# Patient Record
Sex: Female | Born: 1965 | Race: Black or African American | Hispanic: No | Smoking: Current every day smoker
Health system: Southern US, Community
[De-identification: ages and names within clinical notes are randomized; demographics above are authoritative.]

## PROBLEM LIST (undated history)

## (undated) DIAGNOSIS — F319 Bipolar disorder, unspecified: Secondary | ICD-10-CM

---

## 2015-12-26 ENCOUNTER — Ambulatory Visit: Payer: Self-pay | Admitting: Family Medicine

## 2016-08-25 ENCOUNTER — Emergency Department: Payer: Medicaid Other

## 2016-08-25 ENCOUNTER — Inpatient Hospital Stay
Admission: EM | Admit: 2016-08-25 | Discharge: 2016-09-11 | DRG: 617 | Disposition: A | Discharge status: Home / Self Care | Payer: Medicaid Other | Attending: Internal Medicine | Admitting: Internal Medicine

## 2016-08-25 ENCOUNTER — Encounter: Payer: Self-pay | Admitting: Emergency Medicine

## 2016-08-25 DIAGNOSIS — E1165 Type 2 diabetes mellitus with hyperglycemia: Secondary | ICD-10-CM | POA: Diagnosis present

## 2016-08-25 DIAGNOSIS — E11621 Type 2 diabetes mellitus with foot ulcer: Secondary | ICD-10-CM | POA: Diagnosis present

## 2016-08-25 DIAGNOSIS — F17200 Nicotine dependence, unspecified, uncomplicated: Secondary | ICD-10-CM | POA: Diagnosis present

## 2016-08-25 DIAGNOSIS — L03116 Cellulitis of left lower limb: Secondary | ICD-10-CM | POA: Diagnosis present

## 2016-08-25 DIAGNOSIS — E876 Hypokalemia: Secondary | ICD-10-CM | POA: Diagnosis present

## 2016-08-25 DIAGNOSIS — M869 Osteomyelitis, unspecified: Secondary | ICD-10-CM | POA: Diagnosis present

## 2016-08-25 DIAGNOSIS — E1152 Type 2 diabetes mellitus with diabetic peripheral angiopathy with gangrene: Secondary | ICD-10-CM | POA: Diagnosis present

## 2016-08-25 DIAGNOSIS — B9562 Methicillin resistant Staphylococcus aureus infection as the cause of diseases classified elsewhere: Secondary | ICD-10-CM | POA: Diagnosis present

## 2016-08-25 DIAGNOSIS — E1169 Type 2 diabetes mellitus with other specified complication: Secondary | ICD-10-CM | POA: Diagnosis present

## 2016-08-25 DIAGNOSIS — E039 Hypothyroidism, unspecified: Secondary | ICD-10-CM | POA: Diagnosis present

## 2016-08-25 DIAGNOSIS — R609 Edema, unspecified: Secondary | ICD-10-CM

## 2016-08-25 DIAGNOSIS — S92412A Displaced fracture of proximal phalanx of left great toe, initial encounter for closed fracture: Secondary | ICD-10-CM | POA: Diagnosis present

## 2016-08-25 DIAGNOSIS — F3110 Bipolar disorder, current episode manic without psychotic features, unspecified: Secondary | ICD-10-CM

## 2016-08-25 DIAGNOSIS — Z888 Allergy status to other drugs, medicaments and biological substances status: Secondary | ICD-10-CM

## 2016-08-25 DIAGNOSIS — Z7984 Long term (current) use of oral hypoglycemic drugs: Secondary | ICD-10-CM

## 2016-08-25 DIAGNOSIS — L0390 Cellulitis, unspecified: Secondary | ICD-10-CM | POA: Diagnosis present

## 2016-08-25 DIAGNOSIS — F312 Bipolar disorder, current episode manic severe with psychotic features: Secondary | ICD-10-CM | POA: Diagnosis present

## 2016-08-25 DIAGNOSIS — Z89421 Acquired absence of other right toe(s): Secondary | ICD-10-CM

## 2016-08-25 DIAGNOSIS — F419 Anxiety disorder, unspecified: Secondary | ICD-10-CM | POA: Diagnosis present

## 2016-08-25 DIAGNOSIS — I10 Essential (primary) hypertension: Secondary | ICD-10-CM | POA: Diagnosis present

## 2016-08-25 DIAGNOSIS — F29 Unspecified psychosis not due to a substance or known physiological condition: Secondary | ICD-10-CM

## 2016-08-25 DIAGNOSIS — L97529 Non-pressure chronic ulcer of other part of left foot with unspecified severity: Secondary | ICD-10-CM | POA: Diagnosis present

## 2016-08-25 DIAGNOSIS — L03119 Cellulitis of unspecified part of limb: Secondary | ICD-10-CM

## 2016-08-25 HISTORY — DX: Bipolar disorder, unspecified: F31.9

## 2016-08-25 LAB — COMPREHENSIVE METABOLIC PANEL
ALBUMIN: 3.8 g/dL (ref 3.5–5.0)
ALT: 33 U/L (ref 14–54)
AST: 26 U/L (ref 15–41)
Alkaline Phosphatase: 91 U/L (ref 38–126)
Anion gap: 7 (ref 5–15)
BILIRUBIN TOTAL: 0.8 mg/dL (ref 0.3–1.2)
BUN: 14 mg/dL (ref 6–20)
CO2: 27 mmol/L (ref 22–32)
Calcium: 9.3 mg/dL (ref 8.9–10.3)
Chloride: 106 mmol/L (ref 101–111)
Creatinine, Ser: 0.52 mg/dL (ref 0.44–1.00)
GFR calc Af Amer: >60 mL/min (ref >60)
GFR calc non Af Amer: >60 mL/min (ref >60)
GLUCOSE: 155 mg/dL — AB (ref 65–99)
POTASSIUM: 3.3 mmol/L — AB (ref 3.5–5.1)
SODIUM: 140 mmol/L (ref 135–145)
TOTAL PROTEIN: 7.4 g/dL (ref 6.5–8.1)

## 2016-08-25 LAB — CBC
HEMATOCRIT: 35.4 % (ref 35.0–47.0)
HEMOGLOBIN: 11.4 g/dL — AB (ref 12.0–16.0)
MCH: 26.5 pg (ref 26.0–34.0)
MCHC: 32.2 g/dL (ref 32.0–36.0)
MCV: 82.2 fL (ref 80.0–100.0)
Platelets: 371 10*3/uL (ref 150–440)
RBC: 4.31 MIL/uL (ref 3.80–5.20)
RDW: 13.8 % (ref 11.5–14.5)
WBC: 11.4 10*3/uL — ABNORMAL HIGH (ref 3.6–11.0)

## 2016-08-25 LAB — LACTIC ACID, PLASMA
Lactic Acid, Venous: 0.9 mmol/L (ref 0.5–1.9)
Lactic Acid, Venous: 1 mmol/L (ref 0.5–1.9)

## 2016-08-25 LAB — ACETAMINOPHEN LEVEL

## 2016-08-25 LAB — ETHANOL: Alcohol, Ethyl (B): <5 mg/dL (ref <5)

## 2016-08-25 LAB — SALICYLATE LEVEL: Salicylate Lvl: <7 mg/dL (ref 2.8–30.0)

## 2016-08-25 MED ORDER — PIPERACILLIN-TAZOBACTAM 3.375 G IVPB 30 MIN
3.3750 g | Freq: Once | INTRAVENOUS | Status: AC
  Administered 2016-08-25: 3.375 g via INTRAVENOUS
  Filled 2016-08-25: qty 50

## 2016-08-25 MED ORDER — HALOPERIDOL LACTATE 5 MG/ML IJ SOLN
5.0000 mg | Freq: Once | INTRAMUSCULAR | Status: AC
  Administered 2016-08-25: 5 mg via INTRAMUSCULAR

## 2016-08-25 MED ORDER — VANCOMYCIN HCL IN DEXTROSE 1-5 GM/200ML-% IV SOLN
1000.0000 mg | Freq: Three times a day (TID) | INTRAVENOUS | Status: DC
  Administered 2016-08-26 (×4): 1000 mg via INTRAVENOUS
  Filled 2016-08-25 (×5): qty 200

## 2016-08-25 MED ORDER — HALOPERIDOL LACTATE 5 MG/ML IJ SOLN
INTRAMUSCULAR | Status: AC
  Administered 2016-08-25: 5 mg via INTRAMUSCULAR
  Filled 2016-08-25: qty 1

## 2016-08-25 MED ORDER — VANCOMYCIN HCL IN DEXTROSE 1-5 GM/200ML-% IV SOLN
1000.0000 mg | Freq: Once | INTRAVENOUS | Status: AC
  Administered 2016-08-25: 1000 mg via INTRAVENOUS
  Filled 2016-08-25: qty 200

## 2016-08-25 MED ORDER — LORAZEPAM 2 MG/ML IJ SOLN: 2.0000 mg | Freq: Four times a day (QID) | INTRAMUSCULAR | Status: DC | PRN

## 2016-08-25 MED ORDER — LORAZEPAM 2 MG/ML IJ SOLN
INTRAMUSCULAR | Status: AC
  Administered 2016-08-25: 2 mg via INTRAMUSCULAR
  Filled 2016-08-25: qty 1

## 2016-08-25 MED ORDER — LORAZEPAM 2 MG/ML IJ SOLN
2.0000 mg | Freq: Once | INTRAMUSCULAR | Status: AC
  Administered 2016-08-25: 2 mg via INTRAMUSCULAR

## 2016-08-25 MED ORDER — DIPHENHYDRAMINE HCL 50 MG/ML IJ SOLN
INTRAMUSCULAR | Status: AC
  Administered 2016-08-25: 50 mg via INTRAMUSCULAR
  Filled 2016-08-25: qty 1

## 2016-08-25 MED ORDER — VANCOMYCIN HCL IN DEXTROSE 1-5 GM/200ML-% IV SOLN: 1000.0000 mg | Freq: Two times a day (BID) | INTRAVENOUS | Status: DC

## 2016-08-25 MED ORDER — DIPHENHYDRAMINE HCL 50 MG/ML IJ SOLN
50.0000 mg | Freq: Once | INTRAMUSCULAR | Status: AC
  Administered 2016-08-25: 50 mg via INTRAMUSCULAR

## 2016-08-25 MED ORDER — ENOXAPARIN SODIUM 40 MG/0.4ML ~~LOC~~ SOLN
40.0000 mg | SUBCUTANEOUS | Status: DC
  Administered 2016-08-26 – 2016-09-10 (×15): 40 mg via SUBCUTANEOUS
  Filled 2016-08-25 (×16): qty 0.4

## 2016-08-25 MED ORDER — PIPERACILLIN-TAZOBACTAM 3.375 G IVPB
3.3750 g | Freq: Three times a day (TID) | INTRAVENOUS | Status: DC
  Administered 2016-08-26 – 2016-09-09 (×42): 3.375 g via INTRAVENOUS
  Filled 2016-08-25 (×46): qty 50

## 2016-08-25 NOTE — ED Provider Notes (Signed)
[facility] Emergency Department Provider Note  ____________________________________________   First MD Initiated Contact with Patient 08/25/16 1512     (approximate)  I have reviewed the triage vital signs and the nursing notes.   HISTORY  Chief Complaint Psychiatric Evaluation   HPI Mary Mcdonald is a 51 y.o. female with a history of bipolar disorder who is presenting to the emergency department with police escorts afterdestructive behavior home. There is a suspicion that she has not been taking her medications. When I asked her questions about what brings her to the emergency department she rambles incoherently and is very tangential. I'm unable to get a meaningful answer from her about her medications or what brought her into the hospital.   Past Medical History:  Diagnosis Date  . Bipolar 1 disorder (HCC)     There are no active problems to display for this patient.   History reviewed. No pertinent surgical history.  Prior to Admission medications   Not on File    Allergies Lithium  No family history on file.  Social History Social History  Substance Use Topics  . Smoking status: Current Every Day Smoker  . Smokeless tobacco: Never Used  . Alcohol use Not on file    Review of Systems Level V caveat secondary to altered mental status/psychosis. ____________________________________________   PHYSICAL EXAM:  VITAL SIGNS: ED Triage Vitals  Enc Vitals Group     BP 08/25/16 1416 138/86     Pulse Rate 08/25/16 1416 (!) 105     Resp 08/25/16 1416 16     Temp 08/25/16 1416 97.8 F (36.6 C)     Temp Source 08/25/16 1416 Oral     SpO2 08/25/16 1416 100 %     Weight 08/25/16 1417 160 lb (72.6 kg)     Height --      Head Circumference --      Peak Flow --      Pain Score --      Pain Loc --      Pain Edu? --      Excl. in GC? --     Constitutional: Alert and in no acute distress. Eyes: Conjunctivae are normal. PERRL.  EOMI. Head: Atraumatic. Nose: No congestion/rhinnorhea. Mouth/Throat: Mucous membranes are moist.   Neck: No stridor.   Cardiovascular: tachycardic, regular rhythm. Grossly normal heart sounds.  Good peripheral circulation with equal and bilateral posterior tibial pulses. Respiratory: Normal respiratory effort.  No retractions. Lungs CTAB. Gastrointestinal: Soft and nontender. No distention.  Musculoskeletal:   Left foot is swollen and warm to touch. There are patches of black skin over the great toe on the left side and the patient has ulceration of the left second/long toe as well. The foot and toes to the left are also tender to palpation. Right great toe as well with swelling as well as patchy areas of blackened skin.  Neurologic:   No gross focal neurologic deficits are appreciated.  Moving all 4 extremities equally. No gait instability. Skin:  Skin is warm, dry and intact. No rash noted. Psychiatric: Tangential.  ____________________________________________   LABS (all labs ordered are listed, but only abnormal results are displayed)  Labs Reviewed  COMPREHENSIVE METABOLIC PANEL - Abnormal; Notable for the following:       Result Value   Potassium 3.3 (*)    Glucose, Bld 155 (*)    All other components within normal limits  CBC - Abnormal; Notable for the following:    WBC  11.4 (*)    Hemoglobin 11.4 (*)    All other components within normal limits  ACETAMINOPHEN LEVEL - Abnormal; Notable for the following:    Acetaminophen (Tylenol), Serum <10 (*)    All other components within normal limits  CULTURE, BLOOD (ROUTINE X 2)  CULTURE, BLOOD (ROUTINE X 2)  URINE CULTURE  ETHANOL  SALICYLATE LEVEL  LACTIC ACID, PLASMA  URINE DRUG SCREEN, QUALITATIVE ([facility] ONLY)  LACTIC ACID, PLASMA  URINALYSIS, COMPLETE (UACMP) WITH MICROSCOPIC   ____________________________________________  EKG   ____________________________________________  RADIOLOGY  DG Foot Complete Left  (Accession [id number]) (Order [id number])  Imaging  Date: 08/25/2016 Department: [facility] EMERGENCY DEPARTMENT Released By/Authorizing: Myrna Blazer, MD (auto-released)  Exam Information   Status Exam Begun  Exam Ended   Final [99] 08/25/2016 4:30 PM 08/25/2016 5:00 PM  PACS Images   Show images for DG Foot Complete Left  Study Result   CLINICAL DATA:  Left first toe tenderness and bruising.  No injury.  EXAM: LEFT FOOT - COMPLETE 3+ VIEW  COMPARISON:  None.  FINDINGS: Findings suggest the fracture along the lateral aspect of the head of the first proximal phalanx. This is only seen on the AP view. Mild degenerate change of the midfoot. Minimal spurring over the posterior and inferior calcaneus.  IMPRESSION: Minimally displaced fracture along the lateral aspect of the head of the first proximal phalanx.   Electronically Signed   By: Elberta Fortis M.D.   On: 08/25/2016 17:19    DG Foot Complete Right (Accession [id number]) (Order [id number])  Imaging  Date: 08/25/2016 Department: [facility] EMERGENCY DEPARTMENT Released By/Authorizing: Myrna Blazer, MD (auto-released)  Exam Information   Status Exam Begun  Exam Ended   Final [99] 08/25/2016 4:30 PM 08/25/2016 5:00 PM  PACS Images   Show images for DG Foot Complete Right  Study Result   CLINICAL DATA:  Right foot swelling.  No injury.  EXAM: RIGHT FOOT COMPLETE - 3+ VIEW  COMPARISON:  None.  FINDINGS: Exam demonstrates amputation distal to the head of the second proximal phalanx as well as amputation distal to the of the third middle phalanx. There is mild irregularity with small bony fragment adjacent the distal aspect of the third middle phalanx likely postsurgical, although cannot exclude a small chip fracture. Irregularity over the head of the second metatarsal on the AP view as cannot exclude a subtle fracture. Minimal degenerate  change of the first MTP joint. Minimal spurring over the posterior inferior calcaneus. Mild degenerate change over the midfoot.  IMPRESSION: Minimal irregularity over the head of the second metatarsal as cannot exclude a subtle fracture.  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 Mild irregularity with small bony fragment adjacent the distal aspect of the third middle phalanx likely postsurgical, although cannot exclude chip fracture.      ____________________________________________   PROCEDURES  Procedure(s) performed:   Procedures  Critical Care performed:   ____________________________________________   INITIAL IMPRESSION / ASSESSMENT AND PLAN / ED COURSE  Pertinent labs & imaging results that were available during my care of the patient were reviewed by me and considered in my medical decision making (see chart for details).  ----------------------------------------- 4:09 PM on 08/25/2016 -----------------------------------------  Patient walked out of her room and began yelling at staff. Patient cursing at staff, refusing to return to her room. Required sedation with Benadryl, Ativan and Haldol. Ultimately plan will be to admit this patient her left foot cellulitis.  She does meet sepsis criteria and the sepsis order set was ordered. Patient also placed under involuntary commitment. Psychiatry consult.  ----------------------------------------- 5:30 PM on 08/25/2016 -----------------------------------------  Patient is sedated at this time. Appears to have a fracture of the proximal phalanx of the left great toe. I will leave the toe out of splinting because of the need for observation because of the cellulitis to the foot as well as tissue necrosis.  The patient is under involuntary commitment. She'll be admitted to the hospital. Signed out to Dr. Cherlynn Kaiser.        ____________________________________________   FINAL  CLINICAL IMPRESSION(S) / ED DIAGNOSES  Cellulitis. Psychosis. Toe fracture.    NEW MEDICATIONS STARTED DURING THIS VISIT:  New Prescriptions   No medications on file     Note:  This document was prepared using Dragon voice recognition software and may include unintentional dictation errors.    Myrna Blazer, MD 08/25/16 [phone]

## 2016-08-25 NOTE — ED Notes (Signed)
Pt is noted to be asleep at this time. Resting in bed, respirations even and unlabored. Skin warm, dry, and intact. Will continue to monitor for changes in patient condition.

## 2016-08-25 NOTE — Progress Notes (Signed)
Anticoagulation monitoring(Lovenox):  51 yo female ordered Lovenox 30 mg Q24h  Filed Weights   08/25/16 1417 08/25/16 2053  Weight: 160 lb (72.6 kg) 150 lb 4.8 oz (68.2 kg)   BMI    Lab Results  Component Value Date   CREATININE 0.52 08/25/2016   Estimated Creatinine Clearance: 87.9 mL/min (by C-G formula based on SCr of 0.52 mg/dL). Hemoglobin & Hematocrit     Component Value Date/Time   HGB 11.4 (L) 08/25/2016 1436   HCT 35.4 08/25/2016 1436     Per Protocol for Patient with estCrcl > 30 ml/min and BMI > 40, will transition to Lovenox 40 mg Q24h.

## 2016-08-25 NOTE — ED Notes (Signed)
This RN to bedside to attempt to get patient to roll to her back for the bilateral foot X-ray. PT then becomes agitated, jumped up out of bed and attempted to walk out. Dr. Pershing Proud called to the quad area to assess patient. Verbal orders received for  Benadryl,  Haldol,  Ativan. Per Dr. Pershing Proud pt is under IVC at this time. Pt redirected back to bed. Pt tolerated IM injections well, given by this RN and Durene Cal, Charity fundraiser. Spoke with MD regarding necessary blood work, awaiting patient to calm down prior to attempting to draw blood.

## 2016-08-25 NOTE — ED Notes (Signed)

## 2016-08-25 NOTE — Progress Notes (Signed)
Pharmacy Antibiotic Note  Mary Mcdonald is a 50 y.o. female admitted on 08/25/2016 with cellulitis.  Pharmacy has been consulted for piperacillin/tazobactam and vancomycin dosing. Per RN note, toe is black and patient is being very combative with staff Patient's height is 5'5  Plan: Begin piperacillin/tazobactam 3.375 IV q 8 hours  Begin vancomycin 1000 mg IV q 12 hours (5 hour stacked dosing). Trough has not been ordered since first vanc dose hasn't been given. Trough should be drawn prior to 5th dose to represent steady state. Will target trough of 15-20 due to necrotic toe. Estimated Cmin at ss = 18 mcg/ml PK: t/12 9.36 ke = 0.074 Vd = 44.26  Weight: 160 lb (72.6 kg)  Temp (24hrs), Avg:97.8 F (36.6 C), Min:97.8 F (36.6 C), Max:97.8 F (36.6 C)   Recent Labs Lab 08/25/16 1436  WBC 11.4*  CREATININE 0.52    CrCl cannot be calculated (Unknown ideal weight.).    Allergies  Allergen Reactions  . Lithium Rash    Antimicrobials this admission: Piperacillin/tazobactam 4/7 >>  vancomycin 4/7 >>   Dose adjustments this admission:  Microbiology results: 4/7 BCx: sent 4/7 UCx: sent   Sputum:    MRSA PCR:   Thank you for allowing pharmacy to be a part of this patient's care.  Horris Latino, PharmD Pharmacy Resident 08/25/2016 4:57 PM

## 2016-08-25 NOTE — Progress Notes (Addendum)
ANTIBIOTIC CONSULT NOTE - INITIAL  Pharmacy Consult for Vancomycin, Zosyn  Indication: cellulitis, possible osteomyelitis  Allergies  Allergen Reactions  . Lithium Rash    Patient Measurements: Height:  (175.3 cm) Weight: 150 lb 4.8 oz (68.2 kg) IBW/kg (Calculated) : 66.2 Adjusted Body Weight: 67 kg   Vital Signs: Temp: 98 F (36.7 C) (04/07 2103) Temp Source: Axillary (04/07 2103) BP: 155/69 (04/07 2103) Pulse Rate: 79 (04/07 2103) Intake/Output from previous day: No intake/output data recorded. Intake/Output from this shift: No intake/output data recorded.  Labs:  Recent Labs  08/25/16 1436  WBC 11.4*  HGB 11.4*  PLT 371  CREATININE 0.52   Estimated Creatinine Clearance: 87.9 mL/min (by C-G formula based on SCr of 0.52 mg/dL). No results for input(s): VANCOTROUGH, VANCOPEAK, VANCORANDOM, GENTTROUGH, GENTPEAK, GENTRANDOM, TOBRATROUGH, TOBRAPEAK, TOBRARND, AMIKACINPEAK, AMIKACINTROU, AMIKACIN in the last 72 hours.   Microbiology: No results found for this or any previous visit (from the past 720 hour(s)).  Medical History: Past Medical History:  Diagnosis Date  . Bipolar 1 disorder (HCC)     Medications:  No prescriptions prior to admission.   Assessment: CrCl = 110 ml/min Ke = 0.096 hr-1 T1/2 = 7.2 hrs Vd = 47.7 L   Goal of Therapy:  Vancomycin trough level 15-20 mcg/ml  Plan:  Expected duration 7 days with resolution of temperature and/or normalization of WBC   Zosyn 3.375 gm IV X 1 given on 4/7 @ 16:40. Zosyn 3.375 gm IV Q8H EI ordered to start on 4/8 @ 22:00.  Vancomycin 1 gm IV X 1 given on 4/7 @ 17:00.  Vancomycin 1 mg IV Q8H ordered to start on 4/8 @ 0100, 6 hrs after 1st dose (stacked dosing). This pt will reach Css by 4/9 @ 0500. Will draw 1st trough on 4/9 @ 0630, which will be at Css.   Rishit Burkhalter D 08/25/2016,9:33 PM

## 2016-08-25 NOTE — ED Notes (Signed)
Report given to Amanda, RN

## 2016-08-25 NOTE — ED Notes (Signed)
Pt dressed out by this RN and brandy, Charity fundraiser.  Pt clothing, shoes, 4 bracelets and 1 ring placed in pt belongings bag.

## 2016-08-25 NOTE — Progress Notes (Addendum)
Pt to room 156 with police escort patient very uncooperative not following simple commands or answering questions. Telemetry monitor placed but pt will not keep on pulse ox probe. Will cont to monitor

## 2016-08-25 NOTE — ED Triage Notes (Signed)
Pt brought in by [facility] for psych eval. Per officer Caralee Ates they were called out for "destroying the house". Pt has hx/o bipolar, may possibly be off of her medication.

## 2016-08-25 NOTE — ED Notes (Signed)
Pt took shoe off in triage, pt left foot swollen, left great toe discolored, black, pt has wound on 2 toe on left foot.

## 2016-08-25 NOTE — H&P (Signed)
PCP:   No PCP Per Patient   Chief Complaint:  Swelling left foot  HPI: This is a 51 year old female with a history of bipolar disease, brought in for what appears to be a manic episode. Patient is very loud and aggressive. She was B52 in the ER and given 5 of Haldol, 2 of Ativan, 50 of Benadryl. As result the patient is sedated and unable to provide any history. She has been involuntarily committed. She has a one-to-one Comptroller. Patient was very aggressive in the ER, mostly quarelling about the fact that she did not need imaging for her feet  Review of Systems:  Unable to obtain due to above   Past Medical History: Past Medical History:  Diagnosis Date  . Bipolar 1 disorder (HCC)    History reviewed. No pertinent surgical history.  Medications: Prior to Admission medications   Not on File    Allergies:   Allergies  Allergen Reactions  . Lithium Rash    Social History: Unable to obtain  Family History: Unable to obtain due to patient sedation  Physical Exam: Vitals:   08/25/16 1416 08/25/16 1417  BP: 138/86   Pulse: (!) 105   Resp: 16   Temp: 97.8 F (36.6 C)   TempSrc: Oral   SpO2: 100%   Weight:  72.6 kg (160 lb)    GeneralSleepy female, appears in no distress yes: PERRLA, pink conjunctiva, no scleral icterus ENT: Moist oral mucosa, neck supple, no thyromegaly Lungs: clear to ascultation, no wheeze, no crackles, no use of accessory muscles Cardiovascular: regular rate and rhythm, no regurgitation, no gallops, no murmurs. No carotid bruits, no JVD Abdomen: soft, positive BS, non-tender, non-distended, no organomegaly, not an acute abdomen GU: not examined Neuro: UNABLE TO ASSESS DUE TO PATIENT'S SOMNOLENCE strength  unable to properly assess due to patient's somnolence, no clubbing. left great toe is swollen, with a bluish hue, broken skin draining, purulent drainage, left lower extremity swollen, positive pedal  pulsation. Extremity warm. right foot With  thickening, calloused skin, evidence of old infection and trauma. Not swollen, not red Skin: no rash, no subcutaneous crepitation, no decubitus Psych: Sedated patient   Labs on Admission:   Recent Labs  08/25/16 1436  NA 140  K 3.3*  CL 106  CO2 27  GLUCOSE 155*  BUN 14  CREATININE 0.52  CALCIUM 9.3    Recent Labs  08/25/16 1436  AST 26  ALT 33  ALKPHOS 91  BILITOT 0.8  PROT 7.4  ALBUMIN 3.8   No results for input(s): LIPASE, AMYLASE in the last 72 hours.  Recent Labs  08/25/16 1436  WBC 11.4*  HGB 11.4*  HCT 35.4  MCV 82.2  PLT 371   No results for input(s): CKTOTAL, CKMB, CKMBINDEX, TROPONINI in the last 72 hours. Invalid input(s): POCBNP No results for input(s): DDIMER in the last 72 hours. No results for input(s): HGBA1C in the last 72 hours. No results for input(s): CHOL, HDL, LDLCALC, TRIG, CHOLHDL, LDLDIRECT in the last 72 hours. No results for input(s): TSH, T4TOTAL, T3FREE, THYROIDAB in the last 72 hours.  Invalid input(s): FREET3 No results for input(s): VITAMINB12, FOLATE, FERRITIN, TIBC, IRON, RETICCTPCT in the last 72 hours.  Micro Results: No results found for this or any previous visit (from the past 240 hour(s)).   Radiological Exams on Admission: Dg Foot Complete Left  Result Date: 08/25/2016 CLINICAL DATA:  Left first toe tenderness and bruising.  No injury. EXAM: LEFT FOOT - COMPLETE 3+ VIEW  COMPARISON:  None. FINDINGS: Findings suggest the fracture along the lateral aspect of the head of the first proximal phalanx. This is only seen on the AP view. Mild degenerate change of the midfoot. Minimal spurring over the posterior and inferior calcaneus. IMPRESSION: Minimally displaced fracture along the lateral aspect of the head of the first proximal phalanx. Electronically Signed   By: Elberta Fortis M.D.   On: 08/25/2016 17:19   Dg Foot Complete Right  Result Date: 08/25/2016 CLINICAL DATA:  Right foot swelling.  No injury. EXAM: RIGHT FOOT  COMPLETE - 3+ VIEW COMPARISON:  None. FINDINGS: Exam demonstrates amputation distal to the head of the second proximal phalanx as well as amputation distal to the of the third middle phalanx. There is mild irregularity with small bony fragment adjacent the distal aspect of the third middle phalanx likely postsurgical, although cannot exclude a small chip fracture. Irregularity over the head of the second metatarsal on the AP view as cannot exclude a subtle fracture. Minimal degenerate change of the first MTP joint. Minimal spurring over the posterior inferior calcaneus. Mild degenerate change over the midfoot. IMPRESSION: Minimal irregularity over the head of the second metatarsal as cannot exclude a subtle fracture. 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 Mild irregularity with small bony fragment adjacent the distal aspect of the third middle phalanx likely postsurgical, although cannot exclude chip fracture. Electronically Signed   By: Elberta Fortis M.D.   On: 08/25/2016 17:16    Assessment/Plan Present on Admission: . Cellulitis left foot -Admit to MedSurg -IV vancomycin and Zosyn, pharmacy to dose -MRI left foot, rule out osteo.   Minimally displaced fracture along the lateral aspect of the head of the first proximal phalanx -Orthopedic consult placed -Splint likely needed but given patient's degree of psychosis. She will will likely number keep it on.   Minimal irregularity over the head of the second metatarsal as cannot exclude a subtle fracture. -See above  Bipolar disease, manic -pysch consulted  Hypokalemia -Potassium ordered  Elnathan Fulford 08/25/2016, 5:59 PM

## 2016-08-26 DIAGNOSIS — F3110 Bipolar disorder, current episode manic without psychotic features, unspecified: Secondary | ICD-10-CM

## 2016-08-26 LAB — URINALYSIS, COMPLETE (UACMP) WITH MICROSCOPIC
Bilirubin Urine: NEGATIVE
Glucose, UA: NEGATIVE mg/dL
Hgb urine dipstick: NEGATIVE
Ketones, ur: NEGATIVE mg/dL
Leukocytes, UA: NEGATIVE
Nitrite: NEGATIVE
Protein, ur: 30 mg/dL — AB
Specific Gravity, Urine: 1.016 (ref 1.005–1.030)
pH: 6 (ref 5.0–8.0)

## 2016-08-26 LAB — BASIC METABOLIC PANEL
Anion gap: 4 — ABNORMAL LOW (ref 5–15)
BUN: 9 mg/dL (ref 6–20)
CALCIUM: 8.9 mg/dL (ref 8.9–10.3)
CO2: 29 mmol/L (ref 22–32)
CREATININE: 0.44 mg/dL (ref 0.44–1.00)
Chloride: 107 mmol/L (ref 101–111)
Glucose, Bld: 121 mg/dL — ABNORMAL HIGH (ref 65–99)
Potassium: 3.4 mmol/L — ABNORMAL LOW (ref 3.5–5.1)
SODIUM: 140 mmol/L (ref 135–145)

## 2016-08-26 LAB — GLUCOSE, CAPILLARY
GLUCOSE-CAPILLARY: 194 mg/dL — AB (ref 65–99)
GLUCOSE-CAPILLARY: 225 mg/dL — AB (ref 65–99)

## 2016-08-26 LAB — URINE DRUG SCREEN, QUALITATIVE (ARMC ONLY)
AMPHETAMINES, UR SCREEN: NOT DETECTED
Barbiturates, Ur Screen: NOT DETECTED
Benzodiazepine, Ur Scrn: NOT DETECTED
CANNABINOID 50 NG, UR ~~LOC~~: POSITIVE — AB
Cocaine Metabolite,Ur ~~LOC~~: NOT DETECTED
MDMA (Ecstasy)Ur Screen: NOT DETECTED
Methadone Scn, Ur: NOT DETECTED
OPIATE, UR SCREEN: NOT DETECTED
Phencyclidine (PCP) Ur S: NOT DETECTED
Tricyclic, Ur Screen: NOT DETECTED

## 2016-08-26 LAB — CBC
HCT: 34.4 % — ABNORMAL LOW (ref 35.0–47.0)
Hemoglobin: 11.2 g/dL — ABNORMAL LOW (ref 12.0–16.0)
MCH: 26.6 pg (ref 26.0–34.0)
MCHC: 32.5 g/dL (ref 32.0–36.0)
MCV: 82 fL (ref 80.0–100.0)
PLATELETS: 328 10*3/uL (ref 150–440)
RBC: 4.19 MIL/uL (ref 3.80–5.20)
RDW: 14.1 % (ref 11.5–14.5)
WBC: 7.3 10*3/uL (ref 3.6–11.0)

## 2016-08-26 LAB — SEDIMENTATION RATE: Sed Rate: 21 mm/hr (ref 0–30)

## 2016-08-26 MED ORDER — INSULIN ASPART 100 UNIT/ML ~~LOC~~ SOLN
0.0000 [IU] | Freq: Three times a day (TID) | SUBCUTANEOUS | Status: DC
  Administered 2016-08-26 – 2016-08-27 (×2): 2 [IU] via SUBCUTANEOUS
  Administered 2016-08-27: 1 [IU] via SUBCUTANEOUS
  Administered 2016-08-27: 2 [IU] via SUBCUTANEOUS
  Administered 2016-08-28 – 2016-08-29 (×3): 3 [IU] via SUBCUTANEOUS
  Administered 2016-08-29: 5 [IU] via SUBCUTANEOUS
  Administered 2016-08-30: 1 [IU] via SUBCUTANEOUS
  Administered 2016-08-30: 3 [IU] via SUBCUTANEOUS
  Administered 2016-08-30: 2 [IU] via SUBCUTANEOUS
  Administered 2016-08-31: 1 [IU] via SUBCUTANEOUS
  Administered 2016-08-31: 3 [IU] via SUBCUTANEOUS
  Administered 2016-09-01: 2 [IU] via SUBCUTANEOUS
  Administered 2016-09-01: 3 [IU] via SUBCUTANEOUS
  Administered 2016-09-01: 1 [IU] via SUBCUTANEOUS
  Administered 2016-09-02 (×2): 2 [IU] via SUBCUTANEOUS
  Administered 2016-09-02: 1 [IU] via SUBCUTANEOUS
  Administered 2016-09-03 (×2): 2 [IU] via SUBCUTANEOUS
  Administered 2016-09-03 – 2016-09-04 (×2): 3 [IU] via SUBCUTANEOUS
  Administered 2016-09-04: 1 [IU] via SUBCUTANEOUS
  Administered 2016-09-05: 5 [IU] via SUBCUTANEOUS
  Administered 2016-09-05 – 2016-09-06 (×3): 2 [IU] via SUBCUTANEOUS
  Administered 2016-09-07 (×2): 1 [IU] via SUBCUTANEOUS
  Administered 2016-09-07: 2 [IU] via SUBCUTANEOUS
  Administered 2016-09-08: 3 [IU] via SUBCUTANEOUS
  Administered 2016-09-08: 2 [IU] via SUBCUTANEOUS
  Administered 2016-09-08: 1 [IU] via SUBCUTANEOUS
  Administered 2016-09-09: 2 [IU] via SUBCUTANEOUS
  Administered 2016-09-09: 3 [IU] via SUBCUTANEOUS
  Administered 2016-09-10 (×2): 1 [IU] via SUBCUTANEOUS
  Administered 2016-09-10 – 2016-09-11 (×2): 3 [IU] via SUBCUTANEOUS
  Administered 2016-09-11: 2 [IU] via SUBCUTANEOUS
  Filled 2016-08-26: qty 2
  Filled 2016-08-26: qty 3
  Filled 2016-08-26: qty 2
  Filled 2016-08-26: qty 3
  Filled 2016-08-26: qty 2
  Filled 2016-08-26: qty 3
  Filled 2016-08-26: qty 5
  Filled 2016-08-26 (×2): qty 1
  Filled 2016-08-26: qty 3
  Filled 2016-08-26: qty 1
  Filled 2016-08-26 (×2): qty 3
  Filled 2016-08-26: qty 1
  Filled 2016-08-26: qty 2
  Filled 2016-08-26: qty 3
  Filled 2016-08-26: qty 1
  Filled 2016-08-26: qty 2
  Filled 2016-08-26: qty 1
  Filled 2016-08-26 (×3): qty 2
  Filled 2016-08-26: qty 4
  Filled 2016-08-26: qty 3
  Filled 2016-08-26: qty 2
  Filled 2016-08-26: qty 1
  Filled 2016-08-26: qty 3
  Filled 2016-08-26: qty 2
  Filled 2016-08-26: qty 3
  Filled 2016-08-26: qty 1
  Filled 2016-08-26 (×2): qty 2
  Filled 2016-08-26: qty 5
  Filled 2016-08-26: qty 1
  Filled 2016-08-26 (×2): qty 2
  Filled 2016-08-26: qty 1
  Filled 2016-08-26: qty 3
  Filled 2016-08-26: qty 2

## 2016-08-26 MED ORDER — INSULIN ASPART 100 UNIT/ML ~~LOC~~ SOLN
0.0000 [IU] | Freq: Every day | SUBCUTANEOUS | Status: DC
  Administered 2016-08-26 – 2016-08-27 (×2): 2 [IU] via SUBCUTANEOUS
  Administered 2016-08-28: 3 [IU] via SUBCUTANEOUS
  Administered 2016-08-30 – 2016-09-09 (×4): 2 [IU] via SUBCUTANEOUS
  Filled 2016-08-26 (×5): qty 2

## 2016-08-26 MED ORDER — HYDROCODONE-ACETAMINOPHEN 5-325 MG PO TABS
1.0000 | ORAL_TABLET | ORAL | Status: DC | PRN
  Administered 2016-08-26 – 2016-09-10 (×6): 1 via ORAL
  Filled 2016-08-26: qty 2
  Filled 2016-08-26 (×7): qty 1

## 2016-08-26 MED ORDER — LEVOTHYROXINE SODIUM 25 MCG PO TABS
25.0000 ug | ORAL_TABLET | Freq: Every day | ORAL | Status: DC
  Administered 2016-08-28 – 2016-08-29 (×2): 25 ug via ORAL
  Filled 2016-08-26 (×2): qty 1

## 2016-08-26 MED ORDER — QUETIAPINE FUMARATE 100 MG PO TABS
100.0000 mg | ORAL_TABLET | Freq: Every day | ORAL | Status: DC
  Administered 2016-08-26 – 2016-08-28 (×3): 100 mg via ORAL
  Filled 2016-08-26 (×3): qty 1

## 2016-08-26 MED ORDER — CLONAZEPAM 0.5 MG PO TABS
0.5000 mg | ORAL_TABLET | Freq: Every day | ORAL | Status: DC
  Administered 2016-08-26: 0.5 mg via ORAL
  Filled 2016-08-26: qty 1

## 2016-08-26 MED ORDER — CLONAZEPAM 0.5 MG PO TABS
2.0000 mg | ORAL_TABLET | Freq: Every day | ORAL | Status: DC
  Administered 2016-08-26 – 2016-08-28 (×3): 2 mg via ORAL
  Filled 2016-08-26 (×3): qty 4

## 2016-08-26 MED ORDER — LISINOPRIL 5 MG PO TABS
5.0000 mg | ORAL_TABLET | Freq: Every day | ORAL | Status: DC
  Administered 2016-08-26 – 2016-09-11 (×16): 5 mg via ORAL
  Filled 2016-08-26 (×17): qty 1

## 2016-08-26 MED ORDER — POTASSIUM CHLORIDE CRYS ER 20 MEQ PO TBCR
40.0000 meq | EXTENDED_RELEASE_TABLET | Freq: Once | ORAL | Status: AC
  Administered 2016-08-26: 40 meq via ORAL
  Filled 2016-08-26: qty 2

## 2016-08-26 NOTE — Progress Notes (Signed)
Patient ID: Mary Mcdonald, female   DOB: March 02, 1966, 51 y.o.   MRN: [id number]  [facility] PROGRESS NOTE  Mary Mcdonald WUJ:[id number] DOB: [birth year] DOA: 08/25/2016 PCP: No PCP Per Patient  HPI/Subjective: I asked the patient on why she came here. She started rambling on about someone taking her bankcard and buying close and shoes and she can take it anymore. I had to ask her physically if anything was bothering her and she said her toe acts up every year. Some pain in the toe.  Objective: Vitals:   08/26/16 0750 08/26/16 1357  BP: (!) 163/75 (!) 154/69  Pulse: 71 80  Resp: 18 18  Temp: 98.1 F (36.7 C) 98.6 F (37 C)    Filed Weights   08/25/16 1417 08/25/16 2053  Weight: 72.6 kg (160 lb) 68.2 kg (150 lb 4.8 oz)    ROS: Review of Systems  Unable to perform ROS: Psychiatric disorder   unable to focus the patient enough to get a good review of systems Exam: Physical Exam  HENT:  Nose: No mucosal edema.  Mouth/Throat: No oropharyngeal exudate.  Eyes: Conjunctivae, EOM and lids are normal. Pupils are equal, round, and reactive to light.  Neck: Carotid bruit is not present. No thyromegaly present.  Cardiovascular: Regular rhythm, S1 normal, S2 normal and normal heart sounds.   Respiratory: She has no decreased breath sounds. She has no wheezes. She has no rhonchi. She has no rales.  GI: Soft. Bowel sounds are normal. There is no tenderness.  Musculoskeletal:       Right ankle: She exhibits no swelling.       Left ankle: She exhibits decreased range of motion and swelling.  Neurological: She is alert. No cranial nerve deficit.  Skin: Skin is warm. Nails show no clubbing.  Left first toe swollen and warm and erythematous. On the base of the first toe there is a little ulceration. Second toe tip ulceration. Dry ulceration on the lateral foot. Foot swollen and erythematous. Patient unable to bend her toes.  Psychiatric: Her speech is rapid and/or pressured.      Data  Reviewed: Basic Metabolic Panel:  Recent Labs Lab 08/25/16 1436 08/26/16 0457  NA 140 140  K 3.3* 3.4*  CL 106 107  CO2 27 29  GLUCOSE 155* 121*  BUN 14 9  CREATININE 0.52 0.44  CALCIUM 9.3 8.9   Liver Function Tests:  Recent Labs Lab 08/25/16 1436  AST 26  ALT 33  ALKPHOS 91  BILITOT 0.8  PROT 7.4  ALBUMIN 3.8   CBC:  Recent Labs Lab 08/25/16 1436 08/26/16 0457  WBC 11.4* 7.3  HGB 11.4* 11.2*  HCT 35.4 34.4*  MCV 82.2 82.0  PLT 371 328     Recent Results (from the past 240 hour(s))  Blood Culture (routine x 2)     Status: None (Preliminary result)   Collection Time: 08/25/16  4:04 PM  Result Value Ref Range Status   Specimen Description BLOOD  L AC  Final   Special Requests BOTTLES DRAWN AEROBIC AND ANAEROBIC  BCAV  Final   Culture NO GROWTH < 24 HOURS  Final   Report Status PENDING  Incomplete  Blood Culture (routine x 2)     Status: None (Preliminary result)   Collection Time: 08/25/16  4:04 PM  Result Value Ref Range Status   Specimen Description BLOOD R HAND  Final   Special Requests BOTTLES DRAWN AEROBIC AND ANAEROBIC  BCAV  Final   Culture NO  GROWTH < 24 HOURS  Final   Report Status PENDING  Incomplete     Studies: Dg Foot Complete Left  Result Date: 08/25/2016 CLINICAL DATA:  Left first toe tenderness and bruising.  No injury. EXAM: LEFT FOOT - COMPLETE 3+ VIEW COMPARISON:  None. FINDINGS: Findings suggest the fracture along the lateral aspect of the head of the first proximal phalanx. This is only seen on the AP view. Mild degenerate change of the midfoot. Minimal spurring over the posterior and inferior calcaneus. IMPRESSION: Minimally displaced fracture along the lateral aspect of the head of the first proximal phalanx. Electronically Signed   By: Elberta Fortis M.D.   On: 08/25/2016 17:19   Dg Foot Complete Right  Result Date: 08/25/2016 CLINICAL DATA:  Right foot swelling.  No injury. EXAM: RIGHT FOOT COMPLETE - 3+ VIEW COMPARISON:  None.  FINDINGS: Exam demonstrates amputation distal to the head of the second proximal phalanx as well as amputation distal to the of the third middle phalanx. There is mild irregularity with small bony fragment adjacent the distal aspect of the third middle phalanx likely postsurgical, although cannot exclude a small chip fracture. Irregularity over the head of the second metatarsal on the AP view as cannot exclude a subtle fracture. Minimal degenerate change of the first MTP joint. Minimal spurring over the posterior inferior calcaneus. Mild degenerate change over the midfoot. IMPRESSION: Minimal irregularity over the head of the second metatarsal as cannot exclude a subtle fracture. 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 Mild irregularity with small bony fragment adjacent the distal aspect of the third middle phalanx likely postsurgical, although cannot exclude chip fracture. Electronically Signed   By: Elberta Fortis M.D.   On: 08/25/2016 17:16    Scheduled Meds: . clonazePAM  0.5 mg Oral QHS  . enoxaparin (LOVENOX) injection  40 mg Subcutaneous Q24H  . insulin aspart  0-5 Units Subcutaneous QHS  . insulin aspart  0-9 Units Subcutaneous TID WC  . [START ON 08/27/2016] levothyroxine  25 mcg Oral Q0600  . lisinopril  5 mg Oral Daily  . piperacillin-tazobactam (ZOSYN)  IV  3.375 g Intravenous Q8H  . vancomycin  1,000 mg Intravenous Q8H    Assessment/Plan:  1. First toe cellulitis with leukocytosis. Diabetic toe ulcers under the first MP joint and second toe tip. IV antibiotics with vancomycin and Zosyn. Case discussed with podiatry Dr. Orland Jarred to evaluate the patient. MRI of the foot ordered but unlikely to be done today. Sedimentation rate only slightly elevated. 2. Type 2 diabetes mellitus. Add on a hemoglobin A1c put on sliding scale. Hold Glucophage at this point 3. Essential hypertension on low-dose lisinopril 4. Hypothyroidism unspecified on  levothyroxine 5. Bipolar disorder. Patient only on clonazepam daily at bedtime. Psychiatry consultation for medication management. Patient seems pressured in her speech at this point and hard to focus.  Code Status:     Code Status Orders        Start     Ordered   08/25/16 2057  Full code  Continuous     08/25/16 2056    Code Status History    Date Active Date Inactive Code Status Order ID Comments User Context   This patient has a current code status but no historical code status.      Disposition Plan: To be determined  Consultants:  Podiatry  Psychiatry  Antibiotics:  Vancomycin  Zosyn  Time spent: 28 minutes  Alford Highland  [employer]

## 2016-08-26 NOTE — Consult Note (Signed)
Patient Demographics  Mary Mcdonald, is a 51 y.o. female   MRN: [id number]   DOB - 19-Jul-1965  Admit Date - 08/25/2016    Outpatient Primary MD for the patient is No PCP Per Patient  Consult requested in the Hospital by Alford Highland, MD, On 08/26/2016    Reason for consult bilateral foot fractures and wounds to the left foot.   With History of -  Past Medical History:  Diagnosis Date  . Bipolar 1 disorder (HCC)       History reviewed. No pertinent surgical history.  in for   Chief Complaint  Patient presents with  . Psychiatric Evaluation     HPI  Mary Mcdonald  is a 51 y.o. female, Admitted yesterday through the ER with elevated white count swelling and redness to the left foot. X-rays in the ER showed fractured second metatarsal on the right foot and a fractured great toe on the left foot. She's had previous amputations partially into the second toe on the right foot. She was also noted to have an ulceration to the tip of the second toe left foot and ulceration on the plantar right great toe and some beginning ulcerative changes on the worst metatarsal phalangeal joint region plantar medial on the right great toe    Review of Systems  :  In addition to the HPI above,  No Fever-chills, No Headache, No changes with Vision or hearing, No problems swallowing food or Liquids, No Chest pain, Cough or Shortness of Breath, No Abdominal pain, No Nausea or Vommitting, Bowel movements are regular, No Blood in stool or Urine, No dysuria, No new skin rashes or bruises, No new joints pains-aches,  No new weakness, tingling, numbness in any extremity, No recent weight gain or loss, No polyuria, polydypsia or polyphagia, Mentally patient has a history of bipolar disease but appears to be stable to this  juncture.  Social History Social History  Substance Use Topics  . Smoking status: Current Every Day Smoker  . Smokeless tobacco: Never Used  . Alcohol use Not on file     Family History No family history on file.   Prior to Admission medications   Not on File    Anti-infectives    Start     Dose/Rate Route Frequency Ordered Stop   08/26/16 0100  piperacillin-tazobactam (ZOSYN) IVPB 3.375 g     3.375 g 12.5 mL/hr over 240 Minutes Intravenous Every 8 hours 08/25/16 1652     08/25/16 2300  vancomycin (VANCOCIN) IVPB 1000 mg/200 mL premix     1,000 mg 200 mL/hr over 60 Minutes Intravenous Every 8 hours 08/25/16 2129     08/25/16 2200  vancomycin (VANCOCIN) IVPB 1000 mg/200 mL premix  Status:  Discontinued     1,000 mg 200 mL/hr over 60 Minutes Intravenous Every 12 hours 08/25/16 1652 08/25/16 2056   08/25/16 1545  piperacillin-tazobactam (ZOSYN) IVPB 3.375 g     3.375 g 100 mL/hr over 30 Minutes Intravenous  Once 08/25/16 1530 08/25/16 1710   08/25/16 1545  vancomycin (VANCOCIN) IVPB 1000 mg/200 mL premix     1,000 mg 200 mL/hr over 60 Minutes Intravenous  Once 08/25/16 1530 08/25/16  1818      Scheduled Meds: . clonazePAM  0.5 mg Oral QHS  . clonazePAM  2 mg Oral QHS  . enoxaparin (LOVENOX) injection  40 mg Subcutaneous Q24H  . insulin aspart  0-5 Units Subcutaneous QHS  . insulin aspart  0-9 Units Subcutaneous TID WC  . [START ON 08/27/2016] levothyroxine  25 mcg Oral Q0600  . lisinopril  5 mg Oral Daily  . piperacillin-tazobactam (ZOSYN)  IV  3.375 g Intravenous Q8H  . QUEtiapine  100 mg Oral QHS  . vancomycin  1,000 mg Intravenous Q8H   Continuous Infusions: PRN Meds:.LORazepam  Allergies  Allergen Reactions  . Lithium Rash    Physical Exam  Vitals  Blood pressure (!) 154/69, pulse 80, temperature 98.6 F (37 C), temperature source Oral, resp. rate 18, height  (1.753 m), weight 68.2 kg (150 lb 4.8 oz), SpO2 99 %.  Lower Extremity  exam:  Vascular:DP and PT pulses are +2 over 4 and fairly strong. Patient has a history of smoking for many years however. Distal toes are fairly warm and does not appear to be cyanotic.  Dermatological: Patient has eschar and scab at the tip of the left second toe. With debridement of this there is a little bit of pus yield from the distal area where there is a wound. This wound ulceration is only a couple millimeters in diameter to the central core bit larger area of damage to the skin is about a centimeter diameter. There is also an ulceration approximately 2 cm in length 1 cm in width with a central core only a few millimeters where there is some drainage. Both second and the great toes are somewhat swollen and inflamed on the left foot. On the right foot there is some blistering of the skin along the medial plantar base of the great toe but no overt redness or cellulitis to this region. Right foot does not appear to be particular swollen anywhere either. Left foot is deathly more swollen than the right. More increased warmth and erythema to the left as well.  Neurological: Patient appears to have some sensory capacity to the area she can feel some discomfort and pain with palpation to the wounds but likely she has some neuropathy otherwise these areas were not progress.  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 X-rays show a likely fracture to the head of the second metatarsal which may be an early indicator of avascular necrosis to that region. Also appears to be a fracture to the head of the proximal phalanx on the left great toe. It is difficult to say whether or not this is a recent fracture or not but her foot is somewhat swollen.  Data Review  CBC  Recent Labs Lab 08/25/16 1436 08/26/16 0457  WBC 11.4* 7.3  HGB 11.4* 11.2*  HCT 35.4 34.4*  PLT 371 328  MCV 82.2 82.0  MCH 26.5 26.6  MCHC 32.2 32.5  RDW 13.8 14.1    ------------------------------------------------------------------------------------------------------------------  Chemistries   Recent Labs Lab 08/25/16 1436 08/26/16 0457  NA 140 140  K 3.3* 3.4*  CL 106 107  CO2 27 29  GLUCOSE 155* 121*  BUN 14 9  CREATININE 0.52 0.44  CALCIUM 9.3 8.9  AST 26  --   ALT 33  --   ALKPHOS 91  --   BILITOT 0.8  --    -------------------------------------------------------------------------------------------------------------- ---------------------------------------------------------------------------------------------------------------  Urinalysis    Component Value Date/Time   COLORURINE  YELLOW (A) 08/26/2016 0030   APPEARANCEUR CLEAR (A) 08/26/2016 0030   LABSPEC 1.016 08/26/2016 0030   PHURINE 6.0 08/26/2016 0030   GLUCOSEU NEGATIVE 08/26/2016 0030   HGBUR NEGATIVE 08/26/2016 0030   BILIRUBINUR NEGATIVE 08/26/2016 0030   KETONESUR NEGATIVE 08/26/2016 0030   PROTEINUR 30 (A) 08/26/2016 0030   NITRITE NEGATIVE 08/26/2016 0030   LEUKOCYTESUR NEGATIVE 08/26/2016 0030     Imaging results:   Dg Foot Complete Left  Result Date: 08/25/2016 CLINICAL DATA:  Left first toe tenderness and bruising.  No injury. EXAM: LEFT FOOT - COMPLETE 3+ VIEW COMPARISON:  None. FINDINGS: Findings suggest the fracture along the lateral aspect of the head of the first proximal phalanx. This is only seen on the AP view. Mild degenerate change of the midfoot. Minimal spurring over the posterior and inferior calcaneus. IMPRESSION: Minimally displaced fracture along the lateral aspect of the head of the first proximal phalanx. Electronically Signed   By: Elberta Fortis M.D.   On: 08/25/2016 17:19   Dg Foot Complete Right  Result Date: 08/25/2016 CLINICAL DATA:  Right foot swelling.  No injury. EXAM: RIGHT FOOT COMPLETE - 3+ VIEW COMPARISON:  None. FINDINGS: Exam demonstrates amputation distal to the head of the second proximal phalanx as well as amputation distal  to the of the third middle phalanx. There is mild irregularity with small bony fragment adjacent the distal aspect of the third middle phalanx likely postsurgical, although cannot exclude a small chip fracture. Irregularity over the head of the second metatarsal on the AP view as cannot exclude a subtle fracture. Minimal degenerate change of the first MTP joint. Minimal spurring over the posterior inferior calcaneus. Mild degenerate change over the midfoot. IMPRESSION: Minimal irregularity over the head of the second metatarsal as cannot exclude a subtle fracture. 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 Mild irregularity with small bony fragment adjacent the distal aspect of the third middle phalanx likely postsurgical, although cannot exclude chip fracture. Electronically Signed   By: Elberta Fortis M.D.   On: 08/25/2016 17:16     Assessment & Plan: Patient has wounds on both feet but the wounds on the left little worse at the tip of the second toe and the plantar great toe. Some cellulitis and likely infection in these areas as well so since her white count has dropped considerably since yesterday to the normal level. He has been on IV antibiotics. X-rays show fractures second metatarsal right foot but this may be an old metatarsal head fracture or some early avascular necrosis that region. Fracture also appears to be at the proximal phalanx head on the left great toe difficult to determine age of this foot could be recent since she's had increased swelling. Her bipolar disorder makes little bit difficult to get a completely accurate history but she states that she had the surgery on her second toe right foot and [facility]. Patient states she lives in [place] this point. States she has new shoes recently but hasn't worn them yet. Plan: MRI scheduled for tomorrow we'll get the results from that and see if there is anything going on the second toe or the plantar  great toe. Course there is a fracture in the area of the MRI will have an increased signal to the great toe. We'll likely see increased signal to the right if MRIs done on the right foot but I'll think is necessary at this point. Ulcerations are fairly minimal but is difficult to tell  how much depth there is to the wounds. Hopefully this can be managed with good local wound care and antibiotics. I ordered a culture on the drainage areas from the second toe today. This is on the left foot. Also order postop shoes and daily wet-to-dry dressing changes. We'll follow the patient  tomorrow as necessary.   Principal Problem:   Bipolar I disorder, most recent episode (or current) manic (HCC) Active Problems:   Cellulitis     Family Communication: Plan discussed with patient    Epimenio Sarin M.D on 08/26/2016 at 6:02 PM  Thank you for the consult, we will follow the patient with you in the Hospital.

## 2016-08-26 NOTE — Consult Note (Signed)
[facility] Face-to-Face Psychiatry Consult   Reason for Consult:  Consult for 51 year old woman who presented to the emergency room last night allegedly for pain in her foot but who was clearly agitated psychotic and showing manic symptoms. Referring Physician:  Leslye Peer Patient Identification: Mary Mcdonald MRN:  [id number] Principal Diagnosis: Bipolar I disorder, most recent episode (or current) manic ([place]) Diagnosis:   Patient Active Problem List   Diagnosis Date Noted  . Bipolar I disorder, most recent episode (or current) manic ([place]) [F31.10] 08/26/2016  . Cellulitis [L03.90] 08/25/2016    Total Time spent with patient: 45 minutes  Subjective:   Mary Mcdonald is a 51 y.o. female patient admitted with "I just came from [place]".  HPI:  Patient interviewed. Chart reviewed. 51 year old woman presented to the emergency room last night. She was reportedly agitated disorganized and bizarre in her behavior even before getting it made it to the ER. Law enforcement reported that she had been urinating in public. Patient was complaining of pain in her left foot but then was only partially cooperative with evaluation of it. She was noted to be disorganized angry agitated and possibly threatening and had to be given forced medicines in the emergency room. On interview today the patient says that she just came here from [place] on Friday. Apparently she was planning to stay with someone in [place] but she says that they threw her out and told her to go to the shelter. Her story is rambling and disorganized and hard to make complete sense of. Patient indicates that she is not currently taking any psychiatric medicine. She is evasive about most specific psychiatric questions. Claims to not be having any particular problems with sleep or appetite. Denies suicidal or homicidal ideation. She says that she used to have hallucinations years ago but no longer does. She says the only psychiatric  medicine she takes currently as clonazepam because it helps her to sleep. Denies that she is drinking or using any drugs although drug screen is positive for marijuana.  Social history: From what I can tell she is primarily located in [place]. She named several hospitals back there. At the same time she says that she was planning to relocate here to [facility]. Unclear exactly who she was planning to stay with.  Medical history: Has cellulitis. I'm unclear about the rest of her medical history. When I ask her about other illnesses she said yes to everything I ask. Couldn't name any specific medicines that she takes.  Substance abuse history: Denies use of alcohol says that she doesn't use any drugs denies any history of substance abuse treatment. Drug screen positive for cannabis  Past Psychiatric History: Evasive about this and we don't have much in the way of any old records but she does indicate she's had bipolar disorder that has been diagnosed "all my life". She says she's had several hospitalizations in the past. Denies any history of suicide attempts. Admits to a history of violence. She says for years she took Depakote and Seroquel but doesn't do that anymore. She to lithium.  Risk to Self: Is patient at risk for suicide?:  (UTA at this time) Risk to Others:   Prior Inpatient Therapy:   Prior Outpatient Therapy:    Past Medical History:  Past Medical History:  Diagnosis Date  . Bipolar 1 disorder ([place])    History reviewed. No pertinent surgical history. Family History: No family history on file. Family Psychiatric  History: Does not know  of any Social History:  History  Alcohol use Not on file     History  Drug use: Unknown    Social History   Social History  . Marital status: Widowed    Spouse name: N/A  . Number of children: N/A  . Years of education: N/A   Social History Main Topics  . Smoking status: Current Every Day Smoker  . Smokeless tobacco:  Never Used  . Alcohol use None  . Drug use: Unknown  . Sexual activity: Not Asked   Other Topics Concern  . None   Social History Narrative  . None   Additional Social History:    Allergies:   Allergies  Allergen Reactions  . Lithium Rash    Labs:  Results for orders placed or performed during the hospital encounter of 08/25/16 (from the past 48 hour(s))  Comprehensive metabolic panel     Status: Abnormal   Collection Time: 08/25/16  2:36 PM  Result Value Ref Range   Sodium 140 135 - 145 mmol/L   Potassium 3.3 (L) 3.5 - 5.1 mmol/L   Chloride 106 101 - 111 mmol/L   CO2 27 22 - 32 mmol/L   Glucose, Bld 155 (H) 65 - 99 mg/dL   BUN 14 6 - 20 mg/dL   Creatinine, Ser 0.52 0.44 - 1.00 mg/dL   Calcium 9.3 8.9 - 10.3 mg/dL   Total Protein 7.4 6.5 - 8.1 g/dL   Albumin 3.8 3.5 - 5.0 g/dL   AST 26 15 - 41 U/L   ALT 33 14 - 54 U/L   Alkaline Phosphatase 91 38 - 126 U/L   Total Bilirubin 0.8 0.3 - 1.2 mg/dL   GFR calc non Af Amer >60 >60 mL/min   GFR calc Af Amer >60 >60 mL/min    Comment: (NOTE) The eGFR has been calculated using the CKD EPI equation. This calculation has not been validated in all clinical situations. eGFR's persistently <60 mL/min signify possible Chronic Kidney Disease.    Anion gap 7 5 - 15  Ethanol     Status: None   Collection Time: 08/25/16  2:36 PM  Result Value Ref Range   Alcohol, Ethyl (B) <5 <5 mg/dL    Comment:        LOWEST DETECTABLE LIMIT FOR SERUM ALCOHOL IS 5 mg/dL FOR MEDICAL PURPOSES ONLY   cbc     Status: Abnormal   Collection Time: 08/25/16  2:36 PM  Result Value Ref Range   WBC 11.4 (H) 3.6 - 11.0 K/uL   RBC 4.31 3.80 - 5.20 MIL/uL   Hemoglobin 11.4 (L) 12.0 - 16.0 g/dL   HCT 35.4 35.0 - 47.0 %   MCV 82.2 80.0 - 100.0 fL   MCH 26.5 26.0 - 34.0 pg   MCHC 32.2 32.0 - 36.0 g/dL   RDW 13.8 11.5 - 14.5 %   Platelets 371 150 - 440 K/uL  Acetaminophen level     Status: Abnormal   Collection Time: 08/25/16  2:36 PM  Result Value  Ref Range   Acetaminophen (Tylenol), Serum <10 (L) 10 - 30 ug/mL    Comment:        THERAPEUTIC CONCENTRATIONS VARY SIGNIFICANTLY. A RANGE OF 10-30 ug/mL MAY BE AN EFFECTIVE CONCENTRATION FOR MANY PATIENTS. HOWEVER, SOME ARE BEST TREATED AT CONCENTRATIONS OUTSIDE THIS RANGE. ACETAMINOPHEN CONCENTRATIONS >150 ug/mL AT 4 HOURS AFTER INGESTION AND >50 ug/mL AT 12 HOURS AFTER INGESTION ARE OFTEN ASSOCIATED WITH TOXIC REACTIONS.   Salicylate level  Status: None   Collection Time: 08/25/16  2:36 PM  Result Value Ref Range   Salicylate Lvl <1.9 2.8 - 30.0 mg/dL  Blood Culture (routine x 2)     Status: None (Preliminary result)   Collection Time: 08/25/16  4:04 PM  Result Value Ref Range   Specimen Description BLOOD  L AC    Special Requests BOTTLES DRAWN AEROBIC AND ANAEROBIC  BCAV    Culture NO GROWTH < 24 HOURS    Report Status PENDING   Blood Culture (routine x 2)     Status: None (Preliminary result)   Collection Time: 08/25/16  4:04 PM  Result Value Ref Range   Specimen Description BLOOD R HAND    Special Requests BOTTLES DRAWN AEROBIC AND ANAEROBIC  BCAV    Culture NO GROWTH < 24 HOURS    Report Status PENDING   Lactic acid, plasma     Status: None   Collection Time: 08/25/16  4:04 PM  Result Value Ref Range   Lactic Acid, Venous 0.9 0.5 - 1.9 mmol/L  Lactic acid, plasma     Status: None   Collection Time: 08/25/16  8:56 PM  Result Value Ref Range   Lactic Acid, Venous 1.0 0.5 - 1.9 mmol/L  Urine Drug Screen, Qualitative     Status: Abnormal   Collection Time: 08/26/16 12:30 AM  Result Value Ref Range   Tricyclic, Ur Screen NONE DETECTED NONE DETECTED   Amphetamines, Ur Screen NONE DETECTED NONE DETECTED   MDMA (Ecstasy)Ur Screen NONE DETECTED NONE DETECTED   Cocaine Metabolite,Ur [place] NONE DETECTED NONE DETECTED   Opiate, Ur Screen NONE DETECTED NONE DETECTED   Phencyclidine (PCP) Ur S NONE DETECTED NONE DETECTED   Cannabinoid 50 Ng, Ur  POSITIVE (A) NONE  DETECTED   Barbiturates, Ur Screen NONE DETECTED NONE DETECTED   Benzodiazepine, Ur Scrn NONE DETECTED NONE DETECTED   Methadone Scn, Ur NONE DETECTED NONE DETECTED    Comment: (NOTE) 417  Tricyclics, urine               Cutoff 1000 ng/mL 200  Amphetamines, urine             Cutoff 1000 ng/mL 300  MDMA (Ecstasy), urine           Cutoff 500 ng/mL 400  Cocaine Metabolite, urine       Cutoff 300 ng/mL 500  Opiate, urine                   Cutoff 300 ng/mL 600  Phencyclidine (PCP), urine      Cutoff 25 ng/mL 700  Cannabinoid, urine              Cutoff 50 ng/mL 800  Barbiturates, urine             Cutoff 200 ng/mL 900  Benzodiazepine, urine           Cutoff 200 ng/mL 1000 Methadone, urine                Cutoff 300 ng/mL 1100 1200 The urine drug screen provides only a preliminary, unconfirmed 1300 analytical test result and should not be used for non-medical 1400 purposes. Clinical consideration and professional judgment should 1500 be applied to any positive drug screen result due to possible 1600 interfering substances. A more specific alternate chemical method 1700 must be used in order to obtain a confirmed analytical result.  1800 Gas chromato graphy / mass spectrometry (GC/MS) is the preferred 1900  confirmatory method.   Urinalysis, Complete w Microscopic     Status: Abnormal   Collection Time: 08/26/16 12:30 AM  Result Value Ref Range   Color, Urine YELLOW (A) YELLOW   APPearance CLEAR (A) CLEAR   Specific Gravity, Urine 1.016 1.005 - 1.030   pH 6.0 5.0 - 8.0   Glucose, UA NEGATIVE NEGATIVE mg/dL   Hgb urine dipstick NEGATIVE NEGATIVE   Bilirubin Urine NEGATIVE NEGATIVE   Ketones, ur NEGATIVE NEGATIVE mg/dL   Protein, ur 30 (A) NEGATIVE mg/dL   Nitrite NEGATIVE NEGATIVE   Leukocytes, UA NEGATIVE NEGATIVE   RBC / HPF 0-5 0 - 5 RBC/hpf   WBC, UA 0-5 0 - 5 WBC/hpf   Bacteria, UA RARE (A) NONE SEEN   Squamous Epithelial / LPF 0-5 (A) NONE SEEN   Mucous PRESENT    Hyaline  Casts, UA PRESENT   Basic metabolic panel     Status: Abnormal   Collection Time: 08/26/16  4:57 AM  Result Value Ref Range   Sodium 140 135 - 145 mmol/L   Potassium 3.4 (L) 3.5 - 5.1 mmol/L   Chloride 107 101 - 111 mmol/L   CO2 29 22 - 32 mmol/L   Glucose, Bld 121 (H) 65 - 99 mg/dL   BUN 9 6 - 20 mg/dL   Creatinine, Ser 0.44 0.44 - 1.00 mg/dL   Calcium 8.9 8.9 - 10.3 mg/dL   GFR calc non Af Amer >60 >60 mL/min   GFR calc Af Amer >60 >60 mL/min    Comment: (NOTE) The eGFR has been calculated using the CKD EPI equation. This calculation has not been validated in all clinical situations. eGFR's persistently <60 mL/min signify possible Chronic Kidney Disease.    Anion gap 4 (L) 5 - 15  CBC     Status: Abnormal   Collection Time: 08/26/16  4:57 AM  Result Value Ref Range   WBC 7.3 3.6 - 11.0 K/uL   RBC 4.19 3.80 - 5.20 MIL/uL   Hemoglobin 11.2 (L) 12.0 - 16.0 g/dL   HCT 34.4 (L) 35.0 - 47.0 %   MCV 82.0 80.0 - 100.0 fL   MCH 26.6 26.0 - 34.0 pg   MCHC 32.5 32.0 - 36.0 g/dL   RDW 14.1 11.5 - 14.5 %   Platelets 328 150 - 440 K/uL  Sedimentation rate     Status: None   Collection Time: 08/26/16  4:57 AM  Result Value Ref Range   Sed Rate 21 0 - 30 mm/hr    Current Facility-Administered Medications  Medication Dose Route Frequency Provider Last Rate Last Dose  . clonazePAM (KLONOPIN) tablet 0.5 mg  0.5 mg Oral QHS Loletha Grayer, MD      . enoxaparin (LOVENOX) injection 40 mg  40 mg Subcutaneous Q24H Debby Crosley, MD      . insulin aspart (novoLOG) injection 0-5 Units  0-5 Units Subcutaneous QHS Loletha Grayer, MD      . insulin aspart (novoLOG) injection 0-9 Units  0-9 Units Subcutaneous TID WC Loletha Grayer, MD      . Derrill Memo ON 08/27/2016] levothyroxine (SYNTHROID, LEVOTHROID) tablet 25 mcg  25 mcg Oral Q0600 Loletha Grayer, MD      . lisinopril (PRINIVIL,ZESTRIL) tablet 5 mg  5 mg Oral Daily Loletha Grayer, MD      . LORazepam (ATIVAN) injection 2 mg  2 mg Intravenous  Q6H PRN Debby Crosley, MD      . piperacillin-tazobactam (ZOSYN) IVPB 3.375 g  3.375 g Intravenous  Q8H Merilyn Baba, RPH   3.375 g at 08/26/16 1351  . potassium chloride SA (K-DUR,KLOR-CON) CR tablet 40 mEq  40 mEq Oral Once Loletha Grayer, MD      . vancomycin (VANCOCIN) IVPB 1000 mg/200 mL premix  1,000 mg Intravenous Q8H Debby Crosley, MD   1,000 mg at 08/26/16 0827    Musculoskeletal: Strength & Muscle Tone: within normal limits Gait & Station: normal Patient leans: N/A  Psychiatric Specialty Exam: Physical Exam  Nursing note and vitals reviewed. Constitutional: She appears well-developed and well-nourished.  HENT:  Head: Normocephalic and atraumatic.  Eyes: Conjunctivae are normal. Pupils are equal, round, and reactive to light.  Neck: Normal range of motion.  Cardiovascular: Regular rhythm and normal heart sounds.   Respiratory: Effort normal. No respiratory distress.  GI: Soft.  Musculoskeletal: Normal range of motion.       Feet:  Neurological: She is alert.  Skin: Skin is warm and dry.  Psychiatric: Her affect is angry and labile. Her speech is tangential. She is agitated and hyperactive. Thought content is paranoid. She expresses impulsivity. She expresses no homicidal and no suicidal ideation. She exhibits abnormal recent memory.    Review of Systems  Constitutional: Negative.   HENT: Negative.   Eyes: Negative.   Respiratory: Negative.   Cardiovascular: Negative.   Gastrointestinal: Negative.   Musculoskeletal: Negative.   Skin: Negative.   Neurological: Negative.   Psychiatric/Behavioral: Positive for memory loss. Negative for depression, hallucinations, substance abuse and suicidal ideas. The patient is not nervous/anxious and does not have insomnia.     Blood pressure (!) 154/69, pulse 80, temperature 98.6 F (37 C), temperature source Oral, resp. rate 18, height _0  (1.753 m), weight 68.2 kg (150 lb 4.8 oz), SpO2 99 %.Body mass index is 22.2 kg/m.    General Appearance: Casual  Eye Contact:  Fair  Speech:  Garbled and Pressured  Volume:  Increased  Mood:  Euphoric and Irritable  Affect:  Inappropriate and Labile  Thought Process:  Disorganized  Orientation:  Other:  She knew she was in a hospital and could tell me the name of it. She told me the year was 1967 but am not sure she really understood the question.  Thought Content:  Rumination  Suicidal Thoughts:  No  Homicidal Thoughts:  No  Memory:  Immediate;   Fair Recent;   Poor Remote;   Fair  Judgement:  Impaired  Insight:  Shallow  Psychomotor Activity:  Increased and Restlessness  Concentration:  Concentration: Poor  Recall:  Poor  Fund of Knowledge:  Poor  Language:  Fair  Akathisia:  No  Handed:  Right  AIMS (if indicated):     Assets:  Resilience  ADL's:  Intact  Cognition:  Impaired,  Mild  Sleep:        Treatment Plan Summary: Daily contact with patient to assess and evaluate symptoms and progress in treatment, Medication management and Plan 51 year old woman who currently has symptoms and signs consistent with bipolar mania including racing thoughts disorganized speech labile and bizarre mood disorganized thinking agitated behavior. Patient is claiming not to be suicidal but was clearly agitated to the point of needing forced medicine last night. She is currently under involuntary commitment. Although she claims she only needs to take her Klonopin and I am going to continue that as well as adding Seroquel at night for her bipolar disorder. Patient should be kept under IVC and monitored in the hospital. Once she is medically stable  we will consider possible transfer down stairs. So far I have not been able to get any collateral information about her.  Disposition: Recommend psychiatric Inpatient admission when medically cleared. Supportive therapy provided about ongoing stressors.  Alethia Berthold, MD 08/26/2016 3:47 PM

## 2016-08-26 NOTE — Plan of Care (Signed)
Problem: Safety: Goal: Ability to remain free from injury will improve Outcome: Progressing Safety sitter at bedside   

## 2016-08-27 ENCOUNTER — Inpatient Hospital Stay: Payer: Medicaid Other

## 2016-08-27 LAB — HEMOGLOBIN A1C
Hgb A1c MFr Bld: 6.7 % — ABNORMAL HIGH (ref 4.8–5.6)
MEAN PLASMA GLUCOSE: 146 mg/dL

## 2016-08-27 LAB — GLUCOSE, CAPILLARY
GLUCOSE-CAPILLARY: 170 mg/dL — AB (ref 65–99)
GLUCOSE-CAPILLARY: 194 mg/dL — AB (ref 65–99)
GLUCOSE-CAPILLARY: 183 mg/dL — AB (ref 65–99)
Glucose-Capillary: 126 mg/dL — ABNORMAL HIGH (ref 65–99)

## 2016-08-27 LAB — RAPID HIV SCREEN (HIV 1/2 AB+AG)
HIV 1/2 Antibodies: NONREACTIVE
HIV-1 P24 Antigen - HIV24: NONREACTIVE

## 2016-08-27 LAB — URINE CULTURE: Culture: <10000 — AB

## 2016-08-27 LAB — HIV ANTIBODY (ROUTINE TESTING W REFLEX): HIV Screen 4th Generation wRfx: NONREACTIVE

## 2016-08-27 LAB — VANCOMYCIN, TROUGH: VANCOMYCIN TR: 13 ug/mL — AB (ref 15–20)

## 2016-08-27 LAB — C-REACTIVE PROTEIN: CRP: <0.8 mg/dL (ref <1.0)

## 2016-08-27 LAB — TSH: TSH: 2.999 u[IU]/mL (ref 0.350–4.500)

## 2016-08-27 MED ORDER — VANCOMYCIN HCL 10 G IV SOLR
1250.0000 mg | Freq: Three times a day (TID) | INTRAVENOUS | Status: DC
  Administered 2016-08-27 – 2016-08-30 (×9): 1250 mg via INTRAVENOUS
  Filled 2016-08-27 (×12): qty 1250

## 2016-08-27 MED ORDER — NICOTINE 21 MG/24HR TD PT24
21.0000 mg | MEDICATED_PATCH | Freq: Every day | TRANSDERMAL | Status: DC
  Administered 2016-08-27 – 2016-09-11 (×16): 21 mg via TRANSDERMAL
  Filled 2016-08-27 (×17): qty 1

## 2016-08-27 NOTE — Progress Notes (Signed)
Pt requesting nicotine patch, [facility] doc paged and notified. Orders received for 21 mg nicotine patch, order placed.

## 2016-08-27 NOTE — Plan of Care (Signed)
Problem: Safety: Goal: Ability to remain free from injury will improve Outcome: Progressing safety sitter at bedside

## 2016-08-27 NOTE — Consult Note (Signed)
[facility] Infectious Disease     Reason for Consult:L 2nd toe osteomyelitis     Referring Physician: Karlton Lemon Date of Admission:  08/25/2016   Principal Problem:   Bipolar I disorder, most recent episode (or current) manic ([place]) Active Problems:   Cellulitis   HPI: Mary Mcdonald is a 51 y.o. female with bipolar admitted with mania and found to have L foot wound with infection and osteomyeltiis on MRI. Seen by podiatry.  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  States she has had to be in a NH a few years ago with a picc line for IV abx  Past Medical History:  Diagnosis Date  . Bipolar 1 disorder ([place])    History reviewed. No pertinent surgical history. Social History  Substance Use Topics  . Smoking status: Current Every Day Smoker  . Smokeless tobacco: Never Used  . Alcohol use Not on file   No family history on file.  Allergies:  Allergies  Allergen Reactions  . Lithium Rash    Current antibiotics: Antibiotics Given (last 72 hours)    Date/Time Action Medication Dose Rate   08/26/16 0026 Given   vancomycin (VANCOCIN) IVPB 1000 mg/200 mL premix 1,000 mg 200 mL/hr   08/26/16 0615 Given   piperacillin-tazobactam (ZOSYN) IVPB 3.375 g 3.375 g 12.5 mL/hr   08/26/16 0827 Given   vancomycin (VANCOCIN) IVPB 1000 mg/200 mL premix 1,000 mg 200 mL/hr   08/26/16 1351 Given   piperacillin-tazobactam (ZOSYN) IVPB 3.375 g 3.375 g 12.5 mL/hr   08/26/16 1549 Given   vancomycin (VANCOCIN) IVPB 1000 mg/200 mL premix 1,000 mg 200 mL/hr   08/26/16 2226 Given   piperacillin-tazobactam (ZOSYN) IVPB 3.375 g 3.375 g 12.5 mL/hr   08/26/16 2226 Given   vancomycin (VANCOCIN) IVPB 1000 mg/200 mL premix 1,000 mg 200 mL/hr   08/27/16 0942 Given  [pt was at MRI]   vancomycin (VANCOCIN) 1,250 mg in sodium chloride 0.9 % 250 mL IVPB 1,250 mg 166.7 mL/hr      MEDICATIONS: . clonazePAM  0.5 mg Oral QHS  . clonazePAM  2 mg Oral QHS   . enoxaparin (LOVENOX) injection  40 mg Subcutaneous Q24H  . insulin aspart  0-5 Units Subcutaneous QHS  . insulin aspart  0-9 Units Subcutaneous TID WC  . levothyroxine  25 mcg Oral Q0600  . lisinopril  5 mg Oral Daily  . piperacillin-tazobactam (ZOSYN)  IV  3.375 g Intravenous Q8H  . QUEtiapine  100 mg Oral QHS  . vancomycin  1,250 mg Intravenous Q8H    Review of Systems - 11 systems reviewed and negative per HPI   OBJECTIVE: Temp:  [97.5 F (36.4 C)-98.8 F (37.1 C)] 97.5 F (36.4 C) (04/09 0940) Pulse Rate:  [73-79] 79 (04/09 0940) Resp:  [16-18] 18 (04/09 0940) BP: (148-193)/(70-91) 164/70 (04/09 0940) SpO2:  [98 %-100 %] 100 % (04/09 0940) Physical Exam  Constitutional:  Confused, walking around the room, rambling, dishelved HENT: [place]/AT, PERRLA, no scleral icterus Mouth/Throat: Oropharynx is clear and moist. No oropharyngeal exudate.  Cardiovascular: Normal rate, regular rhythm and normal heart sounds. Pulmonary/Chest: Effort normal and breath sounds normal. No respiratory distress.  has no wheezes.  Neck = supple, no nuchal rigidity Abdominal: Soft. Bowel sounds are normal.  exhibits no distension. There is no tenderness.  Lymphadenopathy: no cervical adenopathy. No axillary adenopathy Neurological: alert and oriented to person, place, and time.  Ext 2+ edema  LLE to mid shin Skin: L 2nd toe with ulceration on most of the toe, with drainage and purulance Psychiatric: walking around the room, rambling,  LABS: Results for orders placed or performed during the hospital encounter of 08/25/16 (from the past 48 hour(s))  Comprehensive metabolic panel     Status: Abnormal   Collection Time: 08/25/16  2:36 PM  Result Value Ref Range   Sodium 140 135 - 145 mmol/L   Potassium 3.3 (L) 3.5 - 5.1 mmol/L   Chloride 106 101 - 111 mmol/L   CO2 27 22 - 32 mmol/L   Glucose, Bld 155 (H) 65 - 99 mg/dL   BUN 14 6 - 20 mg/dL   Creatinine, Ser 0.52 0.44 - 1.00 mg/dL   Calcium 9.3 8.9  - 10.3 mg/dL   Total Protein 7.4 6.5 - 8.1 g/dL   Albumin 3.8 3.5 - 5.0 g/dL   AST 26 15 - 41 U/L   ALT 33 14 - 54 U/L   Alkaline Phosphatase 91 38 - 126 U/L   Total Bilirubin 0.8 0.3 - 1.2 mg/dL   GFR calc non Af Amer >60 >60 mL/min   GFR calc Af Amer >60 >60 mL/min    Comment: (NOTE) The eGFR has been calculated using the CKD EPI equation. This calculation has not been validated in all clinical situations. eGFR's persistently <60 mL/min signify possible Chronic Kidney Disease.    Anion gap 7 5 - 15  Ethanol     Status: None   Collection Time: 08/25/16  2:36 PM  Result Value Ref Range   Alcohol, Ethyl (B) <5 <5 mg/dL    Comment:        LOWEST DETECTABLE LIMIT FOR SERUM ALCOHOL IS 5 mg/dL FOR MEDICAL PURPOSES ONLY   cbc     Status: Abnormal   Collection Time: 08/25/16  2:36 PM  Result Value Ref Range   WBC 11.4 (H) 3.6 - 11.0 K/uL   RBC 4.31 3.80 - 5.20 MIL/uL   Hemoglobin 11.4 (L) 12.0 - 16.0 g/dL   HCT 35.4 35.0 - 47.0 %   MCV 82.2 80.0 - 100.0 fL   MCH 26.5 26.0 - 34.0 pg   MCHC 32.2 32.0 - 36.0 g/dL   RDW 13.8 11.5 - 14.5 %   Platelets 371 150 - 440 K/uL  Acetaminophen level     Status: Abnormal   Collection Time: 08/25/16  2:36 PM  Result Value Ref Range   Acetaminophen (Tylenol), Serum <10 (L) 10 - 30 ug/mL    Comment:        THERAPEUTIC CONCENTRATIONS VARY SIGNIFICANTLY. A RANGE OF 10-30 ug/mL MAY BE AN EFFECTIVE CONCENTRATION FOR MANY PATIENTS. HOWEVER, SOME ARE BEST TREATED AT CONCENTRATIONS OUTSIDE THIS RANGE. ACETAMINOPHEN CONCENTRATIONS >150 ug/mL AT 4 HOURS AFTER INGESTION AND >50 ug/mL AT 12 HOURS AFTER INGESTION ARE OFTEN ASSOCIATED WITH TOXIC REACTIONS.   Salicylate level     Status: None   Collection Time: 08/25/16  2:36 PM  Result Value Ref Range   Salicylate Lvl <2.4 2.8 - 30.0 mg/dL  Blood Culture (routine x 2)     Status: None (Preliminary result)   Collection Time: 08/25/16  4:04 PM  Result Value Ref Range   Specimen Description  BLOOD  L AC    Special Requests BOTTLES DRAWN AEROBIC AND ANAEROBIC  BCAV    Culture NO GROWTH 2 DAYS    Report Status PENDING   Blood Culture (routine x 2)     Status: None (Preliminary  result)   Collection Time: 08/25/16  4:04 PM  Result Value Ref Range   Specimen Description BLOOD R HAND    Special Requests BOTTLES DRAWN AEROBIC AND ANAEROBIC  BCAV    Culture NO GROWTH 2 DAYS    Report Status PENDING   Lactic acid, plasma     Status: None   Collection Time: 08/25/16  4:04 PM  Result Value Ref Range   Lactic Acid, Venous 0.9 0.5 - 1.9 mmol/L  Lactic acid, plasma     Status: None   Collection Time: 08/25/16  8:56 PM  Result Value Ref Range   Lactic Acid, Venous 1.0 0.5 - 1.9 mmol/L  Urine Drug Screen, Qualitative     Status: Abnormal   Collection Time: 08/26/16 12:30 AM  Result Value Ref Range   Tricyclic, Ur Screen NONE DETECTED NONE DETECTED   Amphetamines, Ur Screen NONE DETECTED NONE DETECTED   MDMA (Ecstasy)Ur Screen NONE DETECTED NONE DETECTED   Cocaine Metabolite,Ur [place] NONE DETECTED NONE DETECTED   Opiate, Ur Screen NONE DETECTED NONE DETECTED   Phencyclidine (PCP) Ur S NONE DETECTED NONE DETECTED   Cannabinoid 50 Ng, Ur [place] POSITIVE (A) NONE DETECTED   Barbiturates, Ur Screen NONE DETECTED NONE DETECTED   Benzodiazepine, Ur Scrn NONE DETECTED NONE DETECTED   Methadone Scn, Ur NONE DETECTED NONE DETECTED    Comment: (NOTE) 161  Tricyclics, urine               Cutoff 1000 ng/mL 200  Amphetamines, urine             Cutoff 1000 ng/mL 300  MDMA (Ecstasy), urine           Cutoff 500 ng/mL 400  Cocaine Metabolite, urine       Cutoff 300 ng/mL 500  Opiate, urine                   Cutoff 300 ng/mL 600  Phencyclidine (PCP), urine      Cutoff 25 ng/mL 700  Cannabinoid, urine              Cutoff 50 ng/mL 800  Barbiturates, urine             Cutoff 200 ng/mL 900  Benzodiazepine, urine           Cutoff 200 ng/mL 1000 Methadone, urine                Cutoff 300 ng/mL 1100 1200 The  urine drug screen provides only a preliminary, unconfirmed 1300 analytical test result and should not be used for non-medical 1400 purposes. Clinical consideration and professional judgment should 1500 be applied to any positive drug screen result due to possible 1600 interfering substances. A more specific alternate chemical method 1700 must be used in order to obtain a confirmed analytical result.  1800 Gas chromato graphy / mass spectrometry (GC/MS) is the preferred 1900 confirmatory method.   Urinalysis, Complete w Microscopic     Status: Abnormal   Collection Time: 08/26/16 12:30 AM  Result Value Ref Range   Color, Urine YELLOW (A) YELLOW   APPearance CLEAR (A) CLEAR   Specific Gravity, Urine 1.016 1.005 - 1.030   pH 6.0 5.0 - 8.0   Glucose, UA NEGATIVE NEGATIVE mg/dL   Hgb urine dipstick NEGATIVE NEGATIVE   Bilirubin Urine NEGATIVE NEGATIVE   Ketones, ur NEGATIVE NEGATIVE mg/dL   Protein, ur 30 (A) NEGATIVE mg/dL   Nitrite NEGATIVE NEGATIVE   Leukocytes, UA NEGATIVE NEGATIVE  RBC / HPF 0-5 0 - 5 RBC/hpf   WBC, UA 0-5 0 - 5 WBC/hpf   Bacteria, UA RARE (A) NONE SEEN   Squamous Epithelial / LPF 0-5 (A) NONE SEEN   Mucous PRESENT    Hyaline Casts, UA PRESENT   Urine culture     Status: Abnormal   Collection Time: 08/26/16 12:30 AM  Result Value Ref Range   Specimen Description URINE, RANDOM    Special Requests NONE    Culture (A)     <10,000 COLONIES/mL INSIGNIFICANT GROWTH Performed at [facility], [place] [address] [place], [place] [address]    Report Status 08/27/2016 FINAL   Basic metabolic panel     Status: Abnormal   Collection Time: 08/26/16  4:57 AM  Result Value Ref Range   Sodium 140 135 - 145 mmol/L   Potassium 3.4 (L) 3.5 - 5.1 mmol/L   Chloride 107 101 - 111 mmol/L   CO2 29 22 - 32 mmol/L   Glucose, Bld 121 (H) 65 - 99 mg/dL   BUN 9 6 - 20 mg/dL   Creatinine, Ser 0.44 0.44 - 1.00 mg/dL   Calcium 8.9 8.9 - 10.3 mg/dL   GFR calc non Af Amer >60  >60 mL/min   GFR calc Af Amer >60 >60 mL/min    Comment: (NOTE) The eGFR has been calculated using the CKD EPI equation. This calculation has not been validated in all clinical situations. eGFR's persistently <60 mL/min signify possible Chronic Kidney Disease.    Anion gap 4 (L) 5 - 15  CBC     Status: Abnormal   Collection Time: 08/26/16  4:57 AM  Result Value Ref Range   WBC 7.3 3.6 - 11.0 K/uL   RBC 4.19 3.80 - 5.20 MIL/uL   Hemoglobin 11.2 (L) 12.0 - 16.0 g/dL   HCT 34.4 (L) 35.0 - 47.0 %   MCV 82.0 80.0 - 100.0 fL   MCH 26.6 26.0 - 34.0 pg   MCHC 32.5 32.0 - 36.0 g/dL   RDW 14.1 11.5 - 14.5 %   Platelets 328 150 - 440 K/uL  Sedimentation rate     Status: None   Collection Time: 08/26/16  4:57 AM  Result Value Ref Range   Sed Rate 21 0 - 30 mm/hr  Hemoglobin A1c     Status: Abnormal   Collection Time: 08/26/16  4:57 AM  Result Value Ref Range   Hgb A1c MFr Bld 6.7 (H) 4.8 - 5.6 %    Comment: (NOTE)         Pre-diabetes: 5.7 - 6.4         Diabetes: >6.4         Glycemic control for adults with diabetes: <7.0    Mean Plasma Glucose 146 mg/dL    Comment: (NOTE) Performed At: [facility] [address] [place], [place] [id number] Lindon Romp MD PO:[id number]   Glucose, capillary     Status: Abnormal   Collection Time: 08/26/16  4:46 PM  Result Value Ref Range   Glucose-Capillary 194 (H) 65 - 99 mg/dL  Aerobic Culture (superficial specimen)     Status: None (Preliminary result)   Collection Time: 08/26/16  6:15 PM  Result Value Ref Range   Specimen Description FOOT LEFT    Special Requests NONE    Gram Stain      FEW WBC PRESENT, PREDOMINANTLY PMN RARE GRAM POSITIVE COCCI IN PAIRS Performed at [facility], [address], [place], [place] [address]    Culture PENDING    Report Status PENDING   Glucose, capillary     Status: Abnormal   Collection Time: 08/26/16 10:11 PM  Result Value Ref Range   Glucose-Capillary 225 (H) 65 - 99 mg/dL    Comment 1 Notify RN   TSH     Status: None   Collection Time: 08/27/16  6:31 AM  Result Value Ref Range   TSH 2.999 0.350 - 4.500 uIU/mL    Comment: Performed by a 3rd Generation assay with a functional sensitivity of <=0.01 uIU/mL.  Vancomycin, trough     Status: Abnormal   Collection Time: 08/27/16  6:31 AM  Result Value Ref Range   Vancomycin Tr 13 (L) 15 - 20 ug/mL  Glucose, capillary     Status: Abnormal   Collection Time: 08/27/16  8:18 AM  Result Value Ref Range   Glucose-Capillary 170 (H) 65 - 99 mg/dL   No components found for: ESR, C REACTIVE PROTEIN MICRO: Recent Results (from the past 720 hour(s))  Blood Culture (routine x 2)     Status: None (Preliminary result)   Collection Time: 08/25/16  4:04 PM  Result Value Ref Range Status   Specimen Description BLOOD  L AC  Final   Special Requests BOTTLES DRAWN AEROBIC AND ANAEROBIC  BCAV  Final   Culture NO GROWTH 2 DAYS  Final   Report Status PENDING  Incomplete  Blood Culture (routine x 2)     Status: None (Preliminary result)   Collection Time: 08/25/16  4:04 PM  Result Value Ref Range Status   Specimen Description BLOOD R HAND  Final   Special Requests BOTTLES DRAWN AEROBIC AND ANAEROBIC  BCAV  Final   Culture NO GROWTH 2 DAYS  Final   Report Status PENDING  Incomplete  Urine culture     Status: Abnormal   Collection Time: 08/26/16 12:30 AM  Result Value Ref Range Status   Specimen Description URINE, RANDOM  Final   Special Requests NONE  Final   Culture (A)  Final    <10,000 COLONIES/mL INSIGNIFICANT GROWTH Performed at [facility], [address], [place], [place] [address]    Report Status 08/27/2016 FINAL  Final  Aerobic Culture (superficial specimen)     Status: None (Preliminary result)   Collection Time: 08/26/16  6:15 PM  Result Value Ref Range Status   Specimen Description FOOT LEFT  Final   Special Requests NONE  Final   Gram Stain   Final    FEW WBC PRESENT, PREDOMINANTLY PMN RARE GRAM  POSITIVE COCCI IN PAIRS Performed at [facility], [place] [address] [place], [place] [address]    Culture PENDING  Incomplete   Report Status PENDING  Incomplete    IMAGING: Mr Foot Left Wo Contrast  Result Date: 08/27/2016 CLINICAL DATA:  Left foot wounds with an ulceration on the second toe. Redness, pain and swelling of the left foot. Elevated white blood cell count. EXAM: MRI OF THE LEFT FOOT WITHOUT CONTRAST TECHNIQUE: Multiplanar, multisequence MR imaging of the left foot Was performed. No intravenous contrast was administered. COMPARISON:  Plain films left foot 08/25/2016. FINDINGS: Bones/Joint/Cartilage Fracture of the head of the proximal phalanx of the great toe is identified as seen on the prior plain films. The fracture is transverse in orientation through the distal metaphysis with a longitudinal component through the central aspect the articular surface. The articular surface is divided into 2 fragments. The  distal fracture fragments are superiorly displaced 1 shaft width. There is little to no marrow edema about the fracture most consistent with chronic injury. Marked marrow edema is present throughout the distal phalanx of the great toe. There is also marked marrow edema throughout the middle and distal phalanges of the second toe. A milder degree of marrow edema is seen in the head of the second metatarsal with flattening of the articular surface consistent with Freiberg's infraction. Mildly increased T2 signal in all other imaged bones is identified without focal abnormality may be due to stress change or related to technical factors on the exam. Ligaments Intact. Muscles and Tendons Intact. Soft tissues Intense subcutaneous edema is present over the dorsum of the foot. Fluid is seen between the heads of the first and second metatarsals. Small second and third MTP joint effusions are noted. IMPRESSION: Marked marrow edema in the distal phalanx of the great toe and middle and distal  phalanges of the second toe is most consistent osteomyelitis. Fracture of the head of the proximal phalanx of the great toe appears remote. 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 Intense subcutaneous edema over the dorsum of the foot compatible with cellulitis. No abscess is identified. Fluid between the heads of the first and second metatarsals consistent with intermetatarsal bursitis. Electronically Signed   By: Inge Rise M.D.   On: 08/27/2016 09:39   Dg Foot Complete Left  Result Date: 08/25/2016 CLINICAL DATA:  Left first toe tenderness and bruising.  No injury. EXAM: LEFT FOOT - COMPLETE 3+ VIEW COMPARISON:  None. FINDINGS: Findings suggest the fracture along the lateral aspect of the head of the first proximal phalanx. This is only seen on the AP view. Mild degenerate change of the midfoot. Minimal spurring over the posterior and inferior calcaneus. IMPRESSION: Minimally displaced fracture along the lateral aspect of the head of the first proximal phalanx. Electronically Signed   By: Marin Olp M.D.   On: 08/25/2016 17:19   Dg Foot Complete Right  Result Date: 08/25/2016 CLINICAL DATA:  Right foot swelling.  No injury. EXAM: RIGHT FOOT COMPLETE - 3+ VIEW COMPARISON:  None. FINDINGS: Exam demonstrates amputation distal to the head of the second proximal phalanx as well as amputation distal to the of the third middle phalanx. There is mild irregularity with small bony fragment adjacent the distal aspect of the third middle phalanx likely postsurgical, although cannot exclude a small chip fracture. Irregularity over the head of the second metatarsal on the AP view as cannot exclude a subtle fracture. Minimal degenerate change of the first MTP joint. Minimal spurring over the posterior inferior calcaneus. Mild degenerate change over the midfoot. IMPRESSION: Minimal irregularity over the head of the second metatarsal as  cannot exclude a subtle fracture. 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 Mild irregularity with small bony fragment adjacent the distal aspect of the third middle phalanx likely postsurgical, although cannot exclude chip fracture. Electronically Signed   By: Marin Olp M.D.   On: 08/25/2016 17:16    Assessment:   Mary Mcdonald is a 51 y.o. female with cellulitis of L foot as well as osteomyelitis of 2nd toe and possibly 1st toe Cultures pending. For surgery tomorrow. Would likely benefit from 1-2 weeks of IV abx but will depend on findings at surgery and if residual infected bone is present. 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   Recommendations Continue vanco and zosyn Will review tomorrow and if podiatry feels has residual infected bone will order picc and plan on 2 weeks IV abx However given psych illness and social situation a picc may be difficult and carry increased risk Check HIV  Thank you very much for allowing me to participate in the care of this patient. Please call with questions.   Cheral Marker. Ola Spurr, MD

## 2016-08-27 NOTE — Progress Notes (Signed)
Patient ID: Mary Mcdonald, female   DOB: 1965/12/05, 51 y.o.   MRN: [id number]  [facility] PROGRESS NOTE  [name] HQI:[id number] DOB: October 19, 1965 DOA: 08/25/2016 PCP: No PCP Per Patient  HPI/Subjective: Patient with some pain left foot and toes.  Otherwise feels okay. Speech today is back to normal and able to focus better  Objective: Vitals:   08/26/16 2354 08/27/16 0940  BP: (!) 148/72 (!) 164/70  Pulse: 78 79  Resp: 18 18  Temp: 98.8 F (37.1 C) 97.5 F (36.4 C)    Filed Weights   08/25/16 1417 08/25/16 2053  Weight: 72.6 kg (160 lb) 68.2 kg (150 lb 4.8 oz)    ROS: Review of Systems  Constitutional: Negative for chills and fever.  Eyes: Negative for blurred vision.  Respiratory: Negative for cough and shortness of breath.   Cardiovascular: Negative for chest pain.  Gastrointestinal: Negative for abdominal pain, constipation, diarrhea, nausea and vomiting.  Genitourinary: Negative for dysuria.  Musculoskeletal: Positive for joint pain.  Neurological: Negative for dizziness and headaches.    Exam: Physical Exam  HENT:  Nose: No mucosal edema.  Mouth/Throat: No oropharyngeal exudate.  Eyes: Conjunctivae, EOM and lids are normal. Pupils are equal, round, and reactive to light.  Neck: Carotid bruit is not present. No thyromegaly present.  Cardiovascular: Regular rhythm, S1 normal, S2 normal and normal heart sounds.   Respiratory: She has no decreased breath sounds. She has no wheezes. She has no rhonchi. She has no rales.  GI: Soft. Bowel sounds are normal. There is no tenderness.  Musculoskeletal:       Right ankle: She exhibits no swelling.       Left ankle: She exhibits decreased range of motion and swelling.  Neurological: She is alert. No cranial nerve deficit.  Skin: Skin is warm. Nails show no clubbing.  Both feet covered with dressings by podiatry  Psychiatric: She has a normal mood and affect. Her speech is not rapid and/or pressured.      Data  Reviewed: Basic Metabolic Panel:  Recent Labs Lab 08/25/16 1436 08/26/16 0457  NA 140 140  K 3.3* 3.4*  CL 106 107  CO2 27 29  GLUCOSE 155* 121*  BUN 14 9  CREATININE 0.52 0.44  CALCIUM 9.3 8.9   Liver Function Tests:  Recent Labs Lab 08/25/16 1436  AST 26  ALT 33  ALKPHOS 91  BILITOT 0.8  PROT 7.4  ALBUMIN 3.8   CBC:  Recent Labs Lab 08/25/16 1436 08/26/16 0457  WBC 11.4* 7.3  HGB 11.4* 11.2*  HCT 35.4 34.4*  MCV 82.2 82.0  PLT 371 328     Recent Results (from the past 240 hour(s))  Blood Culture (routine x 2)     Status: None (Preliminary result)   Collection Time: 08/25/16  4:04 PM  Result Value Ref Range Status   Specimen Description BLOOD  L AC  Final   Special Requests BOTTLES DRAWN AEROBIC AND ANAEROBIC  BCAV  Final   Culture NO GROWTH 2 DAYS  Final   Report Status PENDING  Incomplete  Blood Culture (routine x 2)     Status: None (Preliminary result)   Collection Time: 08/25/16  4:04 PM  Result Value Ref Range Status   Specimen Description BLOOD R HAND  Final   Special Requests BOTTLES DRAWN AEROBIC AND ANAEROBIC  BCAV  Final   Culture NO GROWTH 2 DAYS  Final   Report Status PENDING  Incomplete  Urine culture  Status: Abnormal   Collection Time: 08/26/16 12:30 AM  Result Value Ref Range Status   Specimen Description URINE, RANDOM  Final   Special Requests NONE  Final   Culture (A)  Final    <10,000 COLONIES/mL INSIGNIFICANT GROWTH Performed at [facility], [address], [place], [place] [address]    Report Status 08/27/2016 FINAL  Final     Studies: Mr Foot Left Wo Contrast  Result Date: 08/27/2016 CLINICAL DATA:  Left foot wounds with an ulceration on the second toe. Redness, pain and swelling of the left foot. Elevated white blood cell count. EXAM: MRI OF THE LEFT FOOT WITHOUT CONTRAST TECHNIQUE: Multiplanar, multisequence MR imaging of the left foot Was performed. No intravenous contrast was administered. COMPARISON:  Plain  films left foot 08/25/2016. FINDINGS: Bones/Joint/Cartilage Fracture of the head of the proximal phalanx of the great toe is identified as seen on the prior plain films. The fracture is transverse in orientation through the distal metaphysis with a longitudinal component through the central aspect the articular surface. The articular surface is divided into 2 fragments. The distal fracture fragments are superiorly displaced 1 shaft width. There is little to no marrow edema about the fracture most consistent with chronic injury. Marked marrow edema is present throughout the distal phalanx of the great toe. There is also marked marrow edema throughout the middle and distal phalanges of the second toe. A milder degree of marrow edema is seen in the head of the second metatarsal with flattening of the articular surface consistent with Freiberg's infraction. Mildly increased T2 signal in all other imaged bones is identified without focal abnormality may be due to stress change or related to technical factors on the exam. Ligaments Intact. Muscles and Tendons Intact. Soft tissues Intense subcutaneous edema is present over the dorsum of the foot. Fluid is seen between the heads of the first and second metatarsals. Small second and third MTP joint effusions are noted. IMPRESSION: Marked marrow edema in the distal phalanx of the great toe and middle and distal phalanges of the second toe is most consistent osteomyelitis. Fracture of the head of the proximal phalanx of the great toe appears remote. 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 Intense subcutaneous edema over the dorsum of the foot compatible with cellulitis. No abscess is identified. Fluid between the heads of the first and second metatarsals consistent with intermetatarsal bursitis. Electronically Signed   By: Drusilla Kanner M.D.   On: 08/27/2016 09:39   Dg Foot Complete Left  Result  Date: 08/25/2016 CLINICAL DATA:  Left first toe tenderness and bruising.  No injury. EXAM: LEFT FOOT - COMPLETE 3+ VIEW COMPARISON:  None. FINDINGS: Findings suggest the fracture along the lateral aspect of the head of the first proximal phalanx. This is only seen on the AP view. Mild degenerate change of the midfoot. Minimal spurring over the posterior and inferior calcaneus. IMPRESSION: Minimally displaced fracture along the lateral aspect of the head of the first proximal phalanx. Electronically Signed   By: Elberta Fortis M.D.   On: 08/25/2016 17:19   Dg Foot Complete Right  Result Date: 08/25/2016 CLINICAL DATA:  Right foot swelling.  No injury. EXAM: RIGHT FOOT COMPLETE - 3+ VIEW COMPARISON:  None. FINDINGS: Exam demonstrates amputation distal to the head of the second proximal phalanx as well as amputation distal to the of the third middle phalanx. There is mild irregularity with small bony fragment adjacent  the distal aspect of the third middle phalanx likely postsurgical, although cannot exclude a small chip fracture. Irregularity over the head of the second metatarsal on the AP view as cannot exclude a subtle fracture. Minimal degenerate change of the first MTP joint. Minimal spurring over the posterior inferior calcaneus. Mild degenerate change over the midfoot. IMPRESSION: Minimal irregularity over the head of the second metatarsal as cannot exclude a subtle fracture. 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 Mild irregularity with small bony fragment adjacent the distal aspect of the third middle phalanx likely postsurgical, although cannot exclude chip fracture. Electronically Signed   By: Elberta Fortis M.D.   On: 08/25/2016 17:16    Scheduled Meds: . clonazePAM  0.5 mg Oral QHS  . clonazePAM  2 mg Oral QHS  . enoxaparin (LOVENOX) injection  40 mg Subcutaneous Q24H  . insulin aspart  0-5 Units Subcutaneous QHS  . insulin aspart  0-9 Units Subcutaneous TID  WC  . levothyroxine  25 mcg Oral Q0600  . lisinopril  5 mg Oral Daily  . piperacillin-tazobactam (ZOSYN)  IV  3.375 g Intravenous Q8H  . QUEtiapine  100 mg Oral QHS  . vancomycin  1,250 mg Intravenous Q8H    Assessment/Plan:  1. Osteomyelitis 2nd toe and cellulitis 1st and second toe with leukocytosis. Diabetic toe ulcers under the first MP joint and second toe tip. IV antibiotics with vancomycin and Zosyn. Case discussed with podiatry about results. Also get ID consultation. 2. Type 2 diabetes mellitus. Good control hemoglobin A1c 6.7. On sliding scale. Hold Glucophage at this point 3. Essential hypertension on low-dose lisinopril 4. Hypothyroidism unspecified on levothyroxine 5. Bipolar disorder. Patient on clonazepam daily at bedtime.  Psychiatry added seroquel.  Code Status:     Code Status Orders        Start     Ordered   08/25/16 2057  Full code  Continuous     08/25/16 2056    Code Status History    Date Active Date Inactive Code Status Order ID Comments User Context   This patient has a current code status but no historical code status.      Disposition Plan: To be determined  Consultants:  Podiatry  Psychiatry  Antibiotics:  Vancomycin  Zosyn  Time spent: 22 minutes  Alford Highland  [employer]

## 2016-08-27 NOTE — Progress Notes (Signed)
Subjective: Patient seen. Some soreness in the left foot.  Objective: Vital signs in last 24 hours: Temp:  [97.5 F (36.4 C)-98.8 F (37.1 C)] 97.5 F (36.4 C) (04/09 0940) Pulse Rate:  [73-80] 79 (04/09 0940) Resp:  [16-18] 18 (04/09 0940) BP: (148-193)/(69-91) 164/70 (04/09 0940) SpO2:  [98 %-100 %] 100 % (04/09 0940) Last BM Date:  (unknown)  Intake/Output from previous day: 04/08 0701 - 04/09 0700 In: 840 [P.O.:840] Out: -  Intake/Output this shift: Total I/O In: 240 [P.O.:240] Out: -   Mild to moderate drainage from the ulceration on the left second toe with minimal from the left hallux area. Continued full thickness ulcerative area with some purulence which probes to bone at the distal aspect of the second toe. No significant expressible purulence from beneath the hallux. MRI was reviewed which did reveal increased bone edema in the distal phalanx of the left hallux and middle and distal phalanx of the second toe. Concerning for osteomyelitis. Evidence for fracture in the proximal phalanx of the hallux which the radiologist felt appears to be old. Also evidence for recent fracture in the head of the second metatarsal.  Lab Results:   Recent Labs  08/25/16 1436 08/26/16 0457  WBC 11.4* 7.3  HGB 11.4* 11.2*  HCT 35.4 34.4*  PLT 371 328   BMET  Recent Labs  08/25/16 1436 08/26/16 0457  NA 140 140  K 3.3* 3.4*  CL 106 107  CO2 27 29  GLUCOSE 155* 121*  BUN 14 9  CREATININE 0.52 0.44  CALCIUM 9.3 8.9   PT/INR No results for input(s): LABPROT, INR in the last 72 hours. ABG No results for input(s): PHART, HCO3 in the last 72 hours.  Invalid input(s): PCO2, PO2  Studies/Results: Mr Foot Left Wo Contrast  Result Date: 08/27/2016 CLINICAL DATA:  Left foot wounds with an ulceration on the second toe. Redness, pain and swelling of the left foot. Elevated white blood cell count. EXAM: MRI OF THE LEFT FOOT WITHOUT CONTRAST TECHNIQUE: Multiplanar, multisequence  MR imaging of the left foot Was performed. No intravenous contrast was administered. COMPARISON:  Plain films left foot 08/25/2016. FINDINGS: Bones/Joint/Cartilage Fracture of the head of the proximal phalanx of the great toe is identified as seen on the prior plain films. The fracture is transverse in orientation through the distal metaphysis with a longitudinal component through the central aspect the articular surface. The articular surface is divided into 2 fragments. The distal fracture fragments are superiorly displaced 1 shaft width. There is little to no marrow edema about the fracture most consistent with chronic injury. Marked marrow edema is present throughout the distal phalanx of the great toe. There is also marked marrow edema throughout the middle and distal phalanges of the second toe. A milder degree of marrow edema is seen in the head of the second metatarsal with flattening of the articular surface consistent with Freiberg's infraction. Mildly increased T2 signal in all other imaged bones is identified without focal abnormality may be due to stress change or related to technical factors on the exam. Ligaments Intact. Muscles and Tendons Intact. Soft tissues Intense subcutaneous edema is present over the dorsum of the foot. Fluid is seen between the heads of the first and second metatarsals. Small second and third MTP joint effusions are noted. IMPRESSION: Marked marrow edema in the distal phalanx of the great toe and middle and distal phalanges of the second toe is most consistent osteomyelitis. Fracture of the head of the proximal   phalanx of the great toe appears remote. 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 Intense subcutaneous edema over the dorsum of the foot compatible with cellulitis. No abscess is identified. Fluid between the heads of the first and second metatarsals consistent with intermetatarsal bursitis.  Electronically Signed   By: Thomas  Dalessio M.D.   On: 08/27/2016 09:39   Dg Foot Complete Left  Result Date: 08/25/2016 CLINICAL DATA:  Left first toe tenderness and bruising.  No injury. EXAM: LEFT FOOT - COMPLETE 3+ VIEW COMPARISON:  None. FINDINGS: Findings suggest the fracture along the lateral aspect of the head of the first proximal phalanx. This is only seen on the AP view. Mild degenerate change of the midfoot. Minimal spurring over the posterior and inferior calcaneus. IMPRESSION: Minimally displaced fracture along the lateral aspect of the head of the first proximal phalanx. Electronically Signed   By: Daniel  Boyle M.D.   On: 08/25/2016 17:19   Dg Foot Complete Right  Result Date: 08/25/2016 CLINICAL DATA:  Right foot swelling.  No injury. EXAM: RIGHT FOOT COMPLETE - 3+ VIEW COMPARISON:  None. FINDINGS: Exam demonstrates amputation distal to the head of the second proximal phalanx as well as amputation distal to the of the third middle phalanx. There is mild irregularity with small bony fragment adjacent the distal aspect of the third middle phalanx likely postsurgical, although cannot exclude a small chip fracture. Irregularity over the head of the second metatarsal on the AP view as cannot exclude a subtle fracture. Minimal degenerate change of the first MTP joint. Minimal spurring over the posterior inferior calcaneus. Mild degenerate change over the midfoot. IMPRESSION: Minimal irregularity over the head of the second metatarsal as cannot exclude a subtle fracture. 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 Mild irregularity with small bony fragment adjacent the distal aspect of the third middle phalanx likely postsurgical, although cannot exclude chip fracture. Electronically Signed   By: Daniel  Boyle M.D.   On: 08/25/2016 17:16    Anti-infectives: Anti-infectives    Start     Dose/Rate Route Frequency Ordered Stop   08/27/16 0800  vancomycin  (VANCOCIN) 1,250 mg in sodium chloride 0.9 % 250 mL IVPB     1,250 mg 166.7 mL/hr over 90 Minutes Intravenous Every 8 hours 08/27/16 0751     08/26/16 0100  piperacillin-tazobactam (ZOSYN) IVPB 3.375 g     3.375 g 12.5 mL/hr over 240 Minutes Intravenous Every 8 hours 08/25/16 1652     08/25/16 2300  vancomycin (VANCOCIN) IVPB 1000 mg/200 mL premix  Status:  Discontinued     1,000 mg 200 mL/hr over 60 Minutes Intravenous Every 8 hours 08/25/16 2129 08/27/16 0751   08/25/16 2200  vancomycin (VANCOCIN) IVPB 1000 mg/200 mL premix  Status:  Discontinued     1,000 mg 200 mL/hr over 60 Minutes Intravenous Every 12 hours 08/25/16 1652 08/25/16 2056   08/25/16 1545  piperacillin-tazobactam (ZOSYN) IVPB 3.375 g     3.375 g 100 mL/hr over 30 Minutes Intravenous  Once 08/25/16 1530 08/25/16 1710   08/25/16 1545  vancomycin (VANCOCIN) IVPB 1000 mg/200 mL premix     1,000 mg 200 mL/hr over 60 Minutes Intravenous  Once 08/25/16 1530 08/25/16 1818      Assessment/Plan: s/p * No surgery found * Assessment: Osteomyelitis left second toe. Clinically not convinced that the hallux is osteomyelitis at this point.   Plan: Discussed with the patient the need for   amputation of the left second toe. Due to the amount of skin loss and distally recommended a complete amputation at the metatarsophalangeal joint and she agrees to this. At this point we did discuss that the great toe may have some infection as well but for now would recommend observation as this could be related just to the fracture. Consent form for amputation of the left second toe. Nothing by mouth after midnight. For now we will try to get her on the schedule for early tomorrow afternoon  LOS: 2 days    Gabriel Paulding W Gabryela Kimbrell 08/27/2016 

## 2016-08-27 NOTE — Progress Notes (Signed)
ANTIBIOTIC CONSULT NOTE - INITIAL  Pharmacy Consult for Vancomycin, Zosyn  Indication: cellulitis, possible osteomyelitis  Allergies  Allergen Reactions  . Lithium Rash    Patient Measurements: Height:  (175.3 cm) Weight: 150 lb 4.8 oz (68.2 kg) IBW/kg (Calculated) : 66.2 Adjusted Body Weight: 67 kg   Vital Signs: Temp: 98.8 F (37.1 C) (04/08 2354) Temp Source: Oral (04/08 2354) BP: 148/72 (04/08 2354) Pulse Rate: 78 (04/08 2354) Intake/Output from previous day: 04/08 0701 - 04/09 0700 In: 840 [P.O.:840] Out: -  Intake/Output from this shift: No intake/output data recorded.  Labs:  Recent Labs  08/25/16 1436 08/26/16 0457  WBC 11.4* 7.3  HGB 11.4* 11.2*  PLT 371 328  CREATININE 0.52 0.44   Estimated Creatinine Clearance: 87.9 mL/min (by C-G formula based on SCr of 0.44 mg/dL).  Recent Labs  08/27/16 0631  VANCOTROUGH 13*     Microbiology: Recent Results (from the past 720 hour(s))  Blood Culture (routine x 2)     Status: None (Preliminary result)   Collection Time: 08/25/16  4:04 PM  Result Value Ref Range Status   Specimen Description BLOOD  L AC  Final   Special Requests BOTTLES DRAWN AEROBIC AND ANAEROBIC  BCAV  Final   Culture NO GROWTH < 24 HOURS  Final   Report Status PENDING  Incomplete  Blood Culture (routine x 2)     Status: None (Preliminary result)   Collection Time: 08/25/16  4:04 PM  Result Value Ref Range Status   Specimen Description BLOOD R HAND  Final   Special Requests BOTTLES DRAWN AEROBIC AND ANAEROBIC  BCAV  Final   Culture NO GROWTH < 24 HOURS  Final   Report Status PENDING  Incomplete    Medical History: Past Medical History:  Diagnosis Date  . Bipolar 1 disorder (HCC)     Medications:  No prescriptions prior to admission.   Assessment: CrCl = 110 ml/min Ke = 0.096 hr-1 T1/2 = 7.2 hrs Vd = 47.7 L   Goal of Therapy:  Vancomycin trough level 15-20 mcg/ml  Plan:  Expected duration 7 days with resolution of  temperature and/or normalization of WBC   Zosyn 3.375 gm IV X 1 given on 4/7 @ 16:40. Zosyn 3.375 gm IV Q8H EI ordered to start on 4/8 @ 22:00.  Vancomycin 1 gm IV X 1 given on 4/7 @ 17:00.  Vancomycin 1 mg IV Q8H ordered to start on 4/8 @ 0100, 6 hrs after 1st dose (stacked dosing). This pt will reach [facility] by 4/9 @ 0500. Will draw 1st trough on 4/9 @ 0630, which will be at C[facility].   Vancomycin trough 13 mcg/mL before sixth dose. MRI today for OM eval - will increase dose to 1.25 gm IV Q8H and recheck trough tomorrow morning. If MRI shows cellulitis we can likely stay at 1 gm IV Q8H but until OM r/o will increase to 1.25 gm IV Q8H.   Carola Frost, Pharm.D., BCPS Clinical Pharmacist 08/27/2016,7:52 AM

## 2016-08-28 ENCOUNTER — Encounter
Admission: EM | Disposition: A | Discharge status: Home / Self Care | Payer: Self-pay | Source: Home / Self Care | Attending: Internal Medicine

## 2016-08-28 ENCOUNTER — Inpatient Hospital Stay: Payer: Medicaid Other | Admitting: Anesthesiology

## 2016-08-28 ENCOUNTER — Encounter: Payer: Self-pay | Admitting: Certified Registered Nurse Anesthetist

## 2016-08-28 HISTORY — PX: AMPUTATION: SHX166

## 2016-08-28 LAB — GLUCOSE, CAPILLARY
GLUCOSE-CAPILLARY: 219 mg/dL — AB (ref 65–99)
GLUCOSE-CAPILLARY: 293 mg/dL — AB (ref 65–99)
Glucose-Capillary: 106 mg/dL — ABNORMAL HIGH (ref 65–99)
Glucose-Capillary: 83 mg/dL (ref 65–99)
Glucose-Capillary: 85 mg/dL (ref 65–99)

## 2016-08-28 LAB — PREGNANCY, URINE: PREG TEST UR: NEGATIVE

## 2016-08-28 LAB — VANCOMYCIN, TROUGH
VANCOMYCIN TR: 16 ug/mL (ref 15–20)
Vancomycin Tr: 24 ug/mL (ref 15–20)

## 2016-08-28 SURGERY — AMPUTATION DIGIT
Anesthesia: General | Laterality: Left | Wound class: Dirty or Infected

## 2016-08-28 MED ORDER — PROPOFOL 10 MG/ML IV BOLUS
INTRAVENOUS | Status: DC | PRN
  Administered 2016-08-28: 40 mg via INTRAVENOUS

## 2016-08-28 MED ORDER — NEOMYCIN-POLYMYXIN B GU 40-200000 IR SOLN
Status: AC
  Filled 2016-08-28: qty 2

## 2016-08-28 MED ORDER — HYDRALAZINE HCL 20 MG/ML IJ SOLN
INTRAMUSCULAR | Status: AC
  Filled 2016-08-28: qty 1

## 2016-08-28 MED ORDER — MIDAZOLAM HCL 2 MG/2ML IJ SOLN
INTRAMUSCULAR | Status: DC | PRN
  Administered 2016-08-28: 2 mg via INTRAVENOUS

## 2016-08-28 MED ORDER — DEXAMETHASONE SODIUM PHOSPHATE 10 MG/ML IJ SOLN
INTRAMUSCULAR | Status: DC | PRN
  Administered 2016-08-28: 5 mg via INTRAVENOUS

## 2016-08-28 MED ORDER — FENTANYL CITRATE (PF) 100 MCG/2ML IJ SOLN
INTRAMUSCULAR | Status: DC | PRN
  Administered 2016-08-28 (×2): 25 ug via INTRAVENOUS
  Administered 2016-08-28: 50 ug via INTRAVENOUS

## 2016-08-28 MED ORDER — PROPOFOL 10 MG/ML IV BOLUS
INTRAVENOUS | Status: AC
  Filled 2016-08-28: qty 20

## 2016-08-28 MED ORDER — PROPOFOL 500 MG/50ML IV EMUL
INTRAVENOUS | Status: DC | PRN
  Administered 2016-08-28: 100 ug/kg/min via INTRAVENOUS

## 2016-08-28 MED ORDER — LIDOCAINE HCL (PF) 1 % IJ SOLN
INTRAMUSCULAR | Status: AC
  Filled 2016-08-28: qty 30

## 2016-08-28 MED ORDER — FENTANYL CITRATE (PF) 100 MCG/2ML IJ SOLN
INTRAMUSCULAR | Status: AC
  Administered 2016-08-28: 25 ug via INTRAVENOUS
  Filled 2016-08-28: qty 2

## 2016-08-28 MED ORDER — PIPERACILLIN-TAZOBACTAM 3.375 G IVPB 30 MIN
3.3750 g | Freq: Once | INTRAVENOUS | Status: AC
  Administered 2016-08-28: 3.375 g via INTRAVENOUS
  Filled 2016-08-28: qty 50

## 2016-08-28 MED ORDER — LIDOCAINE HCL (CARDIAC) 20 MG/ML IV SOLN
INTRAVENOUS | Status: DC | PRN
  Administered 2016-08-28: 100 mg via INTRAVENOUS

## 2016-08-28 MED ORDER — FENTANYL CITRATE (PF) 100 MCG/2ML IJ SOLN
INTRAMUSCULAR | Status: AC
  Filled 2016-08-28: qty 2

## 2016-08-28 MED ORDER — DEXAMETHASONE SODIUM PHOSPHATE 10 MG/ML IJ SOLN
INTRAMUSCULAR | Status: AC
Start: 2016-08-28 — End: 2016-08-28
  Filled 2016-08-28: qty 1

## 2016-08-28 MED ORDER — HYDROCODONE-ACETAMINOPHEN 5-325 MG PO TABS
1.0000 | ORAL_TABLET | ORAL | Status: DC | PRN
  Administered 2016-08-30 – 2016-09-08 (×35): 2 via ORAL
  Administered 2016-09-08: 1 via ORAL
  Administered 2016-09-09 – 2016-09-11 (×8): 2 via ORAL
  Filled 2016-08-28 (×28): qty 2
  Filled 2016-08-28: qty 1
  Filled 2016-08-28 (×14): qty 2

## 2016-08-28 MED ORDER — LIDOCAINE HCL (PF) 2 % IJ SOLN
INTRAMUSCULAR | Status: AC
  Filled 2016-08-28: qty 2

## 2016-08-28 MED ORDER — MIDAZOLAM HCL 2 MG/2ML IJ SOLN
INTRAMUSCULAR | Status: AC
  Filled 2016-08-28: qty 2

## 2016-08-28 MED ORDER — SODIUM CHLORIDE 0.9 % IV SOLN
INTRAVENOUS | Status: DC
  Administered 2016-08-28 – 2016-08-30 (×2): via INTRAVENOUS

## 2016-08-28 MED ORDER — OXYCODONE HCL 5 MG/5ML PO SOLN: 5.0000 mg | Freq: Once | ORAL | Status: DC | PRN

## 2016-08-28 MED ORDER — ONDANSETRON HCL 4 MG/2ML IJ SOLN
INTRAMUSCULAR | Status: AC
  Filled 2016-08-28: qty 2

## 2016-08-28 MED ORDER — BUPIVACAINE HCL (PF) 0.5 % IJ SOLN
INTRAMUSCULAR | Status: AC
  Filled 2016-08-28: qty 30

## 2016-08-28 MED ORDER — ONDANSETRON HCL 4 MG/2ML IJ SOLN
INTRAMUSCULAR | Status: DC | PRN
  Administered 2016-08-28: 4 mg via INTRAVENOUS

## 2016-08-28 MED ORDER — FENTANYL CITRATE (PF) 100 MCG/2ML IJ SOLN
25.0000 ug | INTRAMUSCULAR | Status: DC | PRN
  Administered 2016-08-28 (×4): 25 ug via INTRAVENOUS

## 2016-08-28 MED ORDER — HYDRALAZINE HCL 20 MG/ML IJ SOLN
10.0000 mg | Freq: Once | INTRAMUSCULAR | Status: AC
  Administered 2016-08-28: 10 mg via INTRAVENOUS

## 2016-08-28 MED ORDER — BUPIVACAINE HCL 0.5 % IJ SOLN
INTRAMUSCULAR | Status: DC | PRN
  Administered 2016-08-28: 10 mL

## 2016-08-28 MED ORDER — OXYCODONE HCL 5 MG PO TABS: 5.0000 mg | ORAL_TABLET | Freq: Once | ORAL | Status: DC | PRN

## 2016-08-28 SURGICAL SUPPLY — 44 items
BANDAGE ACE 4X5 VEL STRL LF (GAUZE/BANDAGES/DRESSINGS) ×3 IMPLANT
BLADE MED AGGRESSIVE (BLADE) ×3 IMPLANT
BLADE OSC/SAGITTAL MD 5.5X18 (BLADE) ×3 IMPLANT
BLADE SURG 15 STRL LF DISP TIS (BLADE) ×2 IMPLANT
BLADE SURG 15 STRL SS (BLADE) ×4
BLADE SURG MINI STRL (BLADE) ×3 IMPLANT
BNDG ESMARK 4X12 TAN STRL LF (GAUZE/BANDAGES/DRESSINGS) ×3 IMPLANT
BNDG GAUZE 4.5X4.1 6PLY STRL (MISCELLANEOUS) ×3 IMPLANT
CANISTER SUCT 1200ML W/VALVE (MISCELLANEOUS) ×3 IMPLANT
CLOSURE WOUND 1/4X4 (GAUZE/BANDAGES/DRESSINGS) ×1
CUFF TOURN 18 STER (MISCELLANEOUS) ×3 IMPLANT
CUFF TOURN DUAL PL 12 NO SLV (MISCELLANEOUS) ×3 IMPLANT
DRAPE FLUOR MINI C-ARM 54X84 (DRAPES) ×3 IMPLANT
DURAPREP 26ML APPLICATOR (WOUND CARE) ×3 IMPLANT
ELECT REM PT RETURN 9FT ADLT (ELECTROSURGICAL) ×3
ELECTRODE REM PT RTRN 9FT ADLT (ELECTROSURGICAL) ×1 IMPLANT
GAUZE PETRO XEROFOAM 1X8 (MISCELLANEOUS) ×3 IMPLANT
GAUZE SPONGE 4X4 12PLY STRL (GAUZE/BANDAGES/DRESSINGS) ×3 IMPLANT
GAUZE STRETCH 2X75IN STRL (MISCELLANEOUS) ×3 IMPLANT
GLOVE BIO SURGEON STRL SZ7.5 (GLOVE) ×3 IMPLANT
GLOVE INDICATOR 8.0 STRL GRN (GLOVE) ×3 IMPLANT
GOWN STRL REUS W/ TWL LRG LVL3 (GOWN DISPOSABLE) ×2 IMPLANT
GOWN STRL REUS W/TWL LRG LVL3 (GOWN DISPOSABLE) ×4
HANDPIECE VERSAJET DEBRIDEMENT (MISCELLANEOUS) ×3 IMPLANT
KIT RM TURNOVER STRD PROC AR (KITS) ×3 IMPLANT
LABEL OR SOLS (LABEL) ×3 IMPLANT
NEEDLE FILTER BLUNT 18X 1/2SAF (NEEDLE) ×2
NEEDLE FILTER BLUNT 18X1 1/2 (NEEDLE) ×1 IMPLANT
NEEDLE HYPO 25X1 1.5 SAFETY (NEEDLE) ×6 IMPLANT
NS IRRIG 500ML POUR BTL (IV SOLUTION) ×3 IMPLANT
PACK EXTREMITY ARMC (MISCELLANEOUS) ×3 IMPLANT
SOL .9 NS 3000ML IRR  AL (IV SOLUTION) ×2
SOL .9 NS 3000ML IRR UROMATIC (IV SOLUTION) ×1 IMPLANT
SOL PREP PVP 2OZ (MISCELLANEOUS) ×3
SOLUTION PREP PVP 2OZ (MISCELLANEOUS) ×1 IMPLANT
STOCKINETTE STRL 6IN 960660 (GAUZE/BANDAGES/DRESSINGS) ×3 IMPLANT
STRIP CLOSURE SKIN 1/4X4 (GAUZE/BANDAGES/DRESSINGS) ×2 IMPLANT
SUT ETHILON 3-0 FS-10 30 BLK (SUTURE) ×3
SUT ETHILON 4-0 (SUTURE)
SUT ETHILON 4-0 FS2 18XMFL BLK (SUTURE)
SUTURE EHLN 3-0 FS-10 30 BLK (SUTURE) ×1 IMPLANT
SUTURE ETHLN 4-0 FS2 18XMF BLK (SUTURE) IMPLANT
SWAB DUAL CULTURE TRANS RED ST (MISCELLANEOUS) ×3 IMPLANT
SYRINGE 10CC LL (SYRINGE) ×3 IMPLANT

## 2016-08-28 NOTE — Interval H&P Note (Signed)
History and Physical Interval Note:  08/28/2016 12:23 PM  Mary Mcdonald  has presented today for surgery, with the diagnosis of jjj  The various methods of treatment have been discussed with the patient and family. After consideration of risks, benefits and other options for treatment, the patient has consented to  Procedure(s): AMPUTATION DIGIT 2nd toe (Left) as a surgical intervention .  The patient's history has been reviewed, patient examined, no change in status, stable for surgery.  I have reviewed the patient's chart and labs.  Questions were answered to the patient's satisfaction.     Ricci Barker

## 2016-08-28 NOTE — H&P (View-Only) (Signed)
Subjective: Patient seen. Some soreness in the left foot.  Objective: Vital signs in last 24 hours: Temp:  [97.5 F (36.4 C)-98.8 F (37.1 C)] 97.5 F (36.4 C) (04/09 0940) Pulse Rate:  [73-80] 79 (04/09 0940) Resp:  [16-18] 18 (04/09 0940) BP: (148-193)/(69-91) 164/70 (04/09 0940) SpO2:  [98 %-100 %] 100 % (04/09 0940) Last BM Date:  (unknown)  Intake/Output from previous day: 04/08 0701 - 04/09 0700 In: 840 [P.O.:840] Out: -  Intake/Output this shift: Total I/O In: 240 [P.O.:240] Out: -   Mild to moderate drainage from the ulceration on the left second toe with minimal from the left hallux area. Continued full thickness ulcerative area with some purulence which probes to bone at the distal aspect of the second toe. No significant expressible purulence from beneath the hallux. MRI was reviewed which did reveal increased bone edema in the distal phalanx of the left hallux and middle and distal phalanx of the second toe. Concerning for osteomyelitis. Evidence for fracture in the proximal phalanx of the hallux which the radiologist felt appears to be old. Also evidence for recent fracture in the head of the second metatarsal.  Lab Results:   Recent Labs  08/25/16 1436 08/26/16 0457  WBC 11.4* 7.3  HGB 11.4* 11.2*  HCT 35.4 34.4*  PLT 371 328   BMET  Recent Labs  08/25/16 1436 08/26/16 0457  NA 140 140  K 3.3* 3.4*  CL 106 107  CO2 27 29  GLUCOSE 155* 121*  BUN 14 9  CREATININE 0.52 0.44  CALCIUM 9.3 8.9   PT/INR No results for input(s): LABPROT, INR in the last 72 hours. ABG No results for input(s): PHART, HCO3 in the last 72 hours.  Invalid input(s): PCO2, PO2  Studies/Results: Mr Foot Left Wo Contrast  Result Date: 08/27/2016 CLINICAL DATA:  Left foot wounds with an ulceration on the second toe. Redness, pain and swelling of the left foot. Elevated white blood cell count. EXAM: MRI OF THE LEFT FOOT WITHOUT CONTRAST TECHNIQUE: Multiplanar, multisequence  MR imaging of the left foot Was performed. No intravenous contrast was administered. COMPARISON:  Plain films left foot 08/25/2016. FINDINGS: Bones/Joint/Cartilage Fracture of the head of the proximal phalanx of the great toe is identified as seen on the prior plain films. The fracture is transverse in orientation through the distal metaphysis with a longitudinal component through the central aspect the articular surface. The articular surface is divided into 2 fragments. The distal fracture fragments are superiorly displaced 1 shaft width. There is little to no marrow edema about the fracture most consistent with chronic injury. Marked marrow edema is present throughout the distal phalanx of the great toe. There is also marked marrow edema throughout the middle and distal phalanges of the second toe. A milder degree of marrow edema is seen in the head of the second metatarsal with flattening of the articular surface consistent with Freiberg's infraction. Mildly increased T2 signal in all other imaged bones is identified without focal abnormality may be due to stress change or related to technical factors on the exam. Ligaments Intact. Muscles and Tendons Intact. Soft tissues Intense subcutaneous edema is present over the dorsum of the foot. Fluid is seen between the heads of the first and second metatarsals. Small second and third MTP joint effusions are noted. IMPRESSION: Marked marrow edema in the distal phalanx of the great toe and middle and distal phalanges of the second toe is most consistent osteomyelitis. Fracture of the head of the proximal  phalanx of the great toe appears remote. 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 Intense subcutaneous edema over the dorsum of the foot compatible with cellulitis. No abscess is identified. Fluid between the heads of the first and second metatarsals consistent with intermetatarsal bursitis.  Electronically Signed   By: Drusilla Kanner M.D.   On: 08/27/2016 09:39   Dg Foot Complete Left  Result Date: 08/25/2016 CLINICAL DATA:  Left first toe tenderness and bruising.  No injury. EXAM: LEFT FOOT - COMPLETE 3+ VIEW COMPARISON:  None. FINDINGS: Findings suggest the fracture along the lateral aspect of the head of the first proximal phalanx. This is only seen on the AP view. Mild degenerate change of the midfoot. Minimal spurring over the posterior and inferior calcaneus. IMPRESSION: Minimally displaced fracture along the lateral aspect of the head of the first proximal phalanx. Electronically Signed   By: Elberta Fortis M.D.   On: 08/25/2016 17:19   Dg Foot Complete Right  Result Date: 08/25/2016 CLINICAL DATA:  Right foot swelling.  No injury. EXAM: RIGHT FOOT COMPLETE - 3+ VIEW COMPARISON:  None. FINDINGS: Exam demonstrates amputation distal to the head of the second proximal phalanx as well as amputation distal to the of the third middle phalanx. There is mild irregularity with small bony fragment adjacent the distal aspect of the third middle phalanx likely postsurgical, although cannot exclude a small chip fracture. Irregularity over the head of the second metatarsal on the AP view as cannot exclude a subtle fracture. Minimal degenerate change of the first MTP joint. Minimal spurring over the posterior inferior calcaneus. Mild degenerate change over the midfoot. IMPRESSION: Minimal irregularity over the head of the second metatarsal as cannot exclude a subtle fracture. 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 Mild irregularity with small bony fragment adjacent the distal aspect of the third middle phalanx likely postsurgical, although cannot exclude chip fracture. Electronically Signed   By: Elberta Fortis M.D.   On: 08/25/2016 17:16    Anti-infectives: Anti-infectives    Start     Dose/Rate Route Frequency Ordered Stop   08/27/16 0800  vancomycin  (VANCOCIN) 1,250 mg in sodium chloride 0.9 % 250 mL IVPB     1,250 mg 166.7 mL/hr over 90 Minutes Intravenous Every 8 hours 08/27/16 0751     08/26/16 0100  piperacillin-tazobactam (ZOSYN) IVPB 3.375 g     3.375 g 12.5 mL/hr over 240 Minutes Intravenous Every 8 hours 08/25/16 1652     08/25/16 2300  vancomycin (VANCOCIN) IVPB 1000 mg/200 mL premix  Status:  Discontinued     1,000 mg 200 mL/hr over 60 Minutes Intravenous Every 8 hours 08/25/16 2129 08/27/16 0751   08/25/16 2200  vancomycin (VANCOCIN) IVPB 1000 mg/200 mL premix  Status:  Discontinued     1,000 mg 200 mL/hr over 60 Minutes Intravenous Every 12 hours 08/25/16 1652 08/25/16 2056   08/25/16 1545  piperacillin-tazobactam (ZOSYN) IVPB 3.375 g     3.375 g 100 mL/hr over 30 Minutes Intravenous  Once 08/25/16 1530 08/25/16 1710   08/25/16 1545  vancomycin (VANCOCIN) IVPB 1000 mg/200 mL premix     1,000 mg 200 mL/hr over 60 Minutes Intravenous  Once 08/25/16 1530 08/25/16 1818      Assessment/Plan: s/p * No surgery found * Assessment: Osteomyelitis left second toe. Clinically not convinced that the hallux is osteomyelitis at this point.   Plan: Discussed with the patient the need for  amputation of the left second toe. Due to the amount of skin loss and distally recommended a complete amputation at the metatarsophalangeal joint and she agrees to this. At this point we did discuss that the great toe may have some infection as well but for now would recommend observation as this could be related just to the fracture. Consent form for amputation of the left second toe. Nothing by mouth after midnight. For now we will try to get her on the schedule for early tomorrow afternoon  LOS: 2 days    Ricci Barker 08/27/2016

## 2016-08-28 NOTE — Anesthesia Preprocedure Evaluation (Signed)
Anesthesia Evaluation  Patient identified by MRN, date of birth, ID band Patient awake    Reviewed: Allergy & Precautions, H&P , NPO status , Patient's Chart, lab work & pertinent test results  History of Anesthesia Complications Negative for: history of anesthetic complications  Airway Mallampati: II  TM Distance: >3 FB Neck ROM: full    Dental  (+) Poor Dentition, Chipped, Missing, Caps   Pulmonary neg shortness of breath, COPD, Current Smoker,    Pulmonary exam normal breath sounds clear to auscultation       Cardiovascular Exercise Tolerance: Good (-) angina(-) Past MI and (-) DOE negative cardio ROS Normal cardiovascular exam Rhythm:regular Rate:Normal     Neuro/Psych PSYCHIATRIC DISORDERS Bipolar Disorder negative neurological ROS     GI/Hepatic Neg liver ROS, GERD  Controlled and Medicated,  Endo/Other  diabetes, Type 2  Renal/GU negative Renal ROS  negative genitourinary   Musculoskeletal   Abdominal   Peds  Hematology negative hematology ROS (+)   Anesthesia Other Findings Past Medical History: No date: Bipolar 1 disorder (HCC)  History reviewed. No pertinent surgical history.  BMI    Body Mass Index:  22.20 kg/m      Reproductive/Obstetrics negative OB ROS                             Anesthesia Physical Anesthesia Plan  ASA: III  Anesthesia Plan: General   Post-op Pain Management:    Induction:   Airway Management Planned:   Additional Equipment:   Intra-op Plan:   Post-operative Plan:   Informed Consent: I have reviewed the patients History and Physical, chart, labs and discussed the procedure including the risks, benefits and alternatives for the proposed anesthesia with the patient or authorized representative who has indicated his/her understanding and acceptance.   Dental Advisory Given  Plan Discussed with: Anesthesiologist, CRNA and  Surgeon  Anesthesia Plan Comments:         Anesthesia Quick Evaluation

## 2016-08-28 NOTE — Progress Notes (Signed)
Called to alert pharmicist of vanc trough 24, scheduled dose for 8am, vanc trough to be redrawn for 1500, patient is scheduled for surgery. Rxist Stated to copntinue to administer am dose.

## 2016-08-28 NOTE — Anesthesia Post-op Follow-up Note (Cosign Needed)
Anesthesia QCDR form completed.        

## 2016-08-28 NOTE — Progress Notes (Signed)
ANTIBIOTIC CONSULT NOTE - INITIAL  Pharmacy Consult for Vancomycin, Zosyn  Indication: cellulitis, possible osteomyelitis      Allergies  Allergen Reactions  . Lithium Rash    Patient Measurements: Height:  (175.3 cm) Weight: 150 lb 4.8 oz (68.2 kg) IBW/kg (Calculated) : 66.2 Adjusted Body Weight: 67 kg   Vital Signs: Temp: 98.8 F (37.1 C) (04/08 2354) Temp Source: Oral (04/08 2354) BP: 148/72 (04/08 2354) Pulse Rate: 78 (04/08 2354) Intake/Output from previous day: 04/08 0701 - 04/09 0700 In: 840 [P.O.:840] Out: -  Intake/Output from this shift: No intake/output data recorded.  Labs:  Recent Labs (last 2 labs)    Recent Labs  08/25/16 1436 08/26/16 0457  WBC 11.4* 7.3  HGB 11.4* 11.2*  PLT 371 328  CREATININE 0.52 0.44     Estimated Creatinine Clearance: 87.9 mL/min (by C-G formula based on SCr of 0.44 mg/dL).  Recent Labs (last 2 labs)    Recent Labs  08/27/16 0631  VANCOTROUGH 13*       Microbiology:       Recent Results (from the past 720 hour(s))  Blood Culture (routine x 2)     Status: None (Preliminary result)   Collection Time: 08/25/16  4:04 PM  Result Value Ref Range Status   Specimen Description BLOOD  L AC  Final   Special Requests BOTTLES DRAWN AEROBIC AND ANAEROBIC  BCAV  Final   Culture NO GROWTH < 24 HOURS  Final   Report Status PENDING  Incomplete  Blood Culture (routine x 2)     Status: None (Preliminary result)   Collection Time: 08/25/16  4:04 PM  Result Value Ref Range Status   Specimen Description BLOOD R HAND  Final   Special Requests BOTTLES DRAWN AEROBIC AND ANAEROBIC  BCAV  Final   Culture NO GROWTH < 24 HOURS  Final   Report Status PENDING  Incomplete    Medical History:     Past Medical History:  Diagnosis Date  . Bipolar 1 disorder (HCC)     Medications:  No prescriptions prior to admission.   Assessment: CrCl = 110 ml/min Ke = 0.096 hr-1 T1/2 = 7.2 hrs Vd =  47.7 L   Goal of Therapy:  Vancomycin trough level 15-20 mcg/ml  Plan:  Expected duration 7 days with resolution of temperature and/or normalization of WBC   Zosyn 3.375 gm IV X 1 given on 4/7 @ 16:40. Zosyn 3.375 gm IV Q8H EI ordered to start on 4/8 @ 22:00.  Vancomycin 1 gm IV X 1 given on 4/7 @ 17:00.  Vancomycin 1 mg IV Q8H ordered to start on 4/8 @ 0100, 6 hrs after 1st dose (stacked dosing). This pt will reach Css by 4/9 @ 0500. Will draw 1st trough on 4/9 @ 0630, which will be at Css.   Vancomycin trough 13 mcg/mL before sixth dose. MRI today for OM eval - will increase dose to 1.25 gm IV Q8H and recheck trough tomorrow morning. If MRI shows cellulitis we can likely stay at 1 gm IV Q8H but until OM r/o will increase to 1.25 gm IV Q8H.   4/10 @ 0500 VT 24 was drawn 5 hours too early. Will redraw VT today @ 1500 to reassess.  4/10 @ 1726 16. Will continue with current dose of Vancomycin  IV every 8 hours. Will redraw VT after 5 doses to monitor.   Thank you for this consult.  Gardner Candle, PharmD, BCPS Clinical Pharmacist 08/28/2016 7:00  PM

## 2016-08-28 NOTE — Transfer of Care (Signed)
Immediate Anesthesia Transfer of Care Note  Patient: [name]  Procedure(s) Performed: Procedure(s): AMPUTATION DIGIT 2nd toe (Left)  Patient Location: PACU  Anesthesia Type:General  Level of Consciousness: sedated  Airway & Oxygen Therapy: Patient Spontanous Breathing and Patient connected to face mask oxygen  Post-op Assessment: Report given to RN and Post -op Vital signs reviewed and stable  Post vital signs: Reviewed and stable  Last Vitals:  Vitals:   08/28/16 1216 08/28/16 1329  BP: (!) 216/97 (!) 188/88  Pulse: 77 67  Resp:  15  Temp: (!) 35.9 C 36.3 C    Last Pain:  Vitals:   08/28/16 1329  TempSrc: Temporal  PainSc:          Complications: No apparent anesthesia complications

## 2016-08-28 NOTE — Consult Note (Signed)
[facility] Face-to-Face Psychiatry Consult   Reason for Consult:  Consult for 51 year old woman who presented to the emergency room last night allegedly for pain in her foot but who was clearly agitated psychotic and showing manic symptoms. Referring Physician:  Renae Gloss Patient Identification: Mary Mcdonald MRN:  [id number] Principal Diagnosis: Bipolar I disorder, most recent episode (or current) manic (HCC) Diagnosis:   Patient Active Problem List   Diagnosis Date Noted  . Bipolar I disorder, most recent episode (or current) manic (HCC) [F31.10] 08/26/2016  . Cellulitis [L03.90] 08/25/2016    Total Time spent with patient: 15 minutes  Subjective:   Mary Mcdonald is a 51 y.o. female patient admitted with "I just came from [place]".  Follow-up for Tuesday the 10th. Patient seen. She had surgery on her foot today and was resting today. She was arousable but very sleepy. Not having any agitation today.  HPI:  Patient interviewed. Chart reviewed. 51 year old woman presented to the emergency room last night. She was reportedly agitated disorganized and bizarre in her behavior even before getting it made it to the ER. Law enforcement reported that she had been urinating in public. Patient was complaining of pain in her left foot but then was only partially cooperative with evaluation of it. She was noted to be disorganized angry agitated and possibly threatening and had to be given forced medicines in the emergency room. On interview today the patient says that she just came here from [place] on Friday. Apparently she was planning to stay with someone in [place] but she says that they threw her out and told her to go to the shelter. Her story is rambling and disorganized and hard to make complete sense of. Patient indicates that she is not currently taking any psychiatric medicine. She is evasive about most specific psychiatric questions. Claims to not be having any particular problems  with sleep or appetite. Denies suicidal or homicidal ideation. She says that she used to have hallucinations years ago but no longer does. She says the only psychiatric medicine she takes currently as clonazepam because it helps her to sleep. Denies that she is drinking or using any drugs although drug screen is positive for marijuana.  Social history: From what I can tell she is primarily located in [place]. She named several hospitals back there. At the same time she says that she was planning to relocate here to [facility]. Unclear exactly who she was planning to stay with.  Medical history: Has cellulitis. I'm unclear about the rest of her medical history. When I ask her about other illnesses she said yes to everything I ask. Couldn't name any specific medicines that she takes.  Substance abuse history: Denies use of alcohol says that she doesn't use any drugs denies any history of substance abuse treatment. Drug screen positive for cannabis  Past Psychiatric History: Evasive about this and we don't have much in the way of any old records but she does indicate she's had bipolar disorder that has been diagnosed "all my life". She says she's had several hospitalizations in the past. Denies any history of suicide attempts. Admits to a history of violence. She says for years she took Depakote and Seroquel but doesn't do that anymore. She to lithium.  Risk to Self: Is patient at risk for suicide?:  (UTA at this time) Risk to Others:   Prior Inpatient Therapy:   Prior Outpatient Therapy:    Past Medical History:  Past Medical History:  Diagnosis  Date  . Bipolar 1 disorder (HCC)    History reviewed. No pertinent surgical history. Family History: History reviewed. No pertinent family history. Family Psychiatric  History: Does not know of any Social History:  History  Alcohol use Not on file     History  Drug use: Unknown    Social History   Social History  . Marital  status: Widowed    Spouse name: N/A  . Number of children: N/A  . Years of education: N/A   Social History Main Topics  . Smoking status: Current Every Day Smoker  . Smokeless tobacco: Never Used  . Alcohol use None  . Drug use: Unknown  . Sexual activity: Not Asked   Other Topics Concern  . None   Social History Narrative  . None   Additional Social History:    Allergies:   Allergies  Allergen Reactions  . Lithium Rash    Labs:  Results for orders placed or performed during the hospital encounter of 08/25/16 (from the past 48 hour(s))  Aerobic Culture (superficial specimen)     Status: None (Preliminary result)   Collection Time: 08/26/16  6:15 PM  Result Value Ref Range   Specimen Description FOOT LEFT    Special Requests NONE    Gram Stain      FEW WBC PRESENT, PREDOMINANTLY PMN RARE GRAM POSITIVE COCCI IN PAIRS    Culture      CULTURE REINCUBATED FOR BETTER GROWTH Performed at [facility], [address], [place], [place] [address]    Report Status PENDING   Glucose, capillary     Status: Abnormal   Collection Time: 08/26/16 10:11 PM  Result Value Ref Range   Glucose-Capillary 225 (H) 65 - 99 mg/dL   Comment 1 Notify RN   TSH     Status: None   Collection Time: 08/27/16  6:31 AM  Result Value Ref Range   TSH 2.999 0.350 - 4.500 uIU/mL    Comment: Performed by a 3rd Generation assay with a functional sensitivity of <=0.01 uIU/mL.  Vancomycin, trough     Status: Abnormal   Collection Time: 08/27/16  6:31 AM  Result Value Ref Range   Vancomycin Tr 13 (L) 15 - 20 ug/mL  Glucose, capillary     Status: Abnormal   Collection Time: 08/27/16  8:18 AM  Result Value Ref Range   Glucose-Capillary 170 (H) 65 - 99 mg/dL  Glucose, capillary     Status: Abnormal   Collection Time: 08/27/16 11:55 AM  Result Value Ref Range   Glucose-Capillary 126 (H) 65 - 99 mg/dL  Glucose, capillary     Status: Abnormal   Collection Time: 08/27/16  5:00 PM  Result Value  Ref Range   Glucose-Capillary 194 (H) 65 - 99 mg/dL  Rapid HIV screen (HIV 1/2 Ab+Ag)     Status: None   Collection Time: 08/27/16  7:36 PM  Result Value Ref Range   HIV-1 P24 Antigen - HIV24 NON REACTIVE NON REACTIVE   HIV 1/2 Antibodies NON REACTIVE NON REACTIVE   Interpretation (HIV Ag Ab)      A non reactive test result means that HIV 1 or HIV 2 antibodies and HIV 1 p24 antigen were not detected in the specimen.  C-reactive protein     Status: None   Collection Time: 08/27/16  7:36 PM  Result Value Ref Range   CRP <0.8 <1.0 mg/dL    Comment: Performed at [facility], [address] [place], [place] [address]  Glucose, capillary     Status: Abnormal   Collection Time: 08/27/16  9:38 PM  Result Value Ref Range   Glucose-Capillary 183 (H) 65 - 99 mg/dL   Comment 1 Notify RN   Vancomycin, trough     Status: Abnormal   Collection Time: 08/28/16  4:59 AM  Result Value Ref Range   Vancomycin Tr 24 (HH) 15 - 20 ug/mL    Comment: CRITICAL RESULT CALLED TO, READ BACK BY AND VERIFIED WITH DAVID BESANTI AT 6045 ON 08/28/16 [facility].   Glucose, capillary     Status: Abnormal   Collection Time: 08/28/16  7:37 AM  Result Value Ref Range   Glucose-Capillary 106 (H) 65 - 99 mg/dL  Glucose, capillary     Status: None   Collection Time: 08/28/16 11:43 AM  Result Value Ref Range   Glucose-Capillary 83 65 - 99 mg/dL  Glucose, capillary     Status: None   Collection Time: 08/28/16  1:42 PM  Result Value Ref Range   Glucose-Capillary 85 65 - 99 mg/dL  Glucose, capillary     Status: Abnormal   Collection Time: 08/28/16  4:26 PM  Result Value Ref Range   Glucose-Capillary 219 (H) 65 - 99 mg/dL    Current Facility-Administered Medications  Medication Dose Route Frequency Provider Last Rate Last Dose  . 0.9 %  sodium chloride infusion   Intravenous Continuous Rosaria Ferries, MD 50 mL/hr at 08/28/16 1226    . clonazePAM (KLONOPIN) tablet 2 mg  2 mg Oral QHS Audery Amel, MD   2 mg at  08/27/16 2148  . enoxaparin (LOVENOX) injection 40 mg  40 mg Subcutaneous Q24H Debby Crosley, MD   40 mg at 08/27/16 2148  . HYDROcodone-acetaminophen (NORCO/VICODIN) 5-325 MG per tablet 1 tablet  1 tablet Oral Q4H PRN Oralia Manis, MD   1 tablet at 08/27/16 1542  . HYDROcodone-acetaminophen (NORCO/VICODIN) 5-325 MG per tablet 1-2 tablet  1-2 tablet Oral Q4H PRN Linus Galas, DPM      . insulin aspart (novoLOG) injection 0-5 Units  0-5 Units Subcutaneous QHS Alford Highland, MD   2 Units at 08/27/16 2153  . insulin aspart (novoLOG) injection 0-9 Units  0-9 Units Subcutaneous TID WC Alford Highland, MD   3 Units at 08/28/16 1739  . levothyroxine (SYNTHROID, LEVOTHROID) tablet 25 mcg  25 mcg Oral Q0600 Alford Highland, MD   25 mcg at 08/28/16 0524  . lisinopril (PRINIVIL,ZESTRIL) tablet 5 mg  5 mg Oral Daily Alford Highland, MD   5 mg at 08/27/16 0941  . LORazepam (ATIVAN) injection 2 mg  2 mg Intravenous Q6H PRN Debby Crosley, MD      . nicotine (NICODERM CQ - dosed in mg/24 hours) patch 21 mg  21 mg Transdermal Daily Houston Siren, MD   21 mg at 08/28/16 1739  . piperacillin-tazobactam (ZOSYN) IVPB 3.375 g  3.375 g Intravenous Q8H Rolm Baptise, RPH   3.375 g at 08/28/16 1246  . QUEtiapine (SEROQUEL) tablet 100 mg  100 mg Oral QHS Audery Amel, MD   100 mg at 08/27/16 2148  . vancomycin (VANCOCIN) 1,250 mg in sodium chloride 0.9 % 250 mL IVPB  1,250 mg Intravenous Q8H Alford Highland, MD   1,250 mg at 08/28/16 1739    Musculoskeletal: Strength & Muscle Tone: within normal limits Gait & Station: normal Patient leans: N/A  Psychiatric Specialty Exam: Physical Exam  Nursing note and vitals reviewed. Constitutional: She  appears well-developed and well-nourished.  HENT:  Head: Normocephalic and atraumatic.  Eyes: Conjunctivae are normal. Pupils are equal, round, and reactive to light.  Neck: Normal range of motion.  Cardiovascular: Regular rhythm and normal heart sounds.   Respiratory:  Effort normal. No respiratory distress.  GI: Soft.  Musculoskeletal: Normal range of motion.       Feet:  Neurological: She is alert.  Skin: Skin is warm and dry.  Psychiatric: Her affect is not angry and not labile. Her speech is tangential. She is not agitated and not hyperactive. Thought content is paranoid. She does not express impulsivity. She expresses no homicidal and no suicidal ideation. She exhibits abnormal recent memory.    Review of Systems  Constitutional: Negative.   HENT: Negative.   Eyes: Negative.   Respiratory: Negative.   Cardiovascular: Negative.   Gastrointestinal: Negative.   Musculoskeletal: Negative.   Skin: Negative.   Neurological: Negative.   Psychiatric/Behavioral: Positive for memory loss. Negative for depression, hallucinations, substance abuse and suicidal ideas. The patient is not nervous/anxious and does not have insomnia.     Blood pressure (!) 143/68, pulse 82, temperature 97.9 F (36.6 C), temperature source Oral, resp. rate 16, height  (1.753 m), weight 68.2 kg (150 lb 4.8 oz), SpO2 100 %.Body mass index is 22.2 kg/m.  General Appearance: Casual  Eye Contact:  Fair  Speech:  Garbled and Pressured  Volume:  Increased  Mood:  Euphoric and Irritable  Affect:  Inappropriate and Labile  Thought Process:  Disorganized  Orientation:  Other:  She knew she was in a hospital and could tell me the name of it. She told me the year was 1967 but am not sure she really understood the question.  Thought Content:  Rumination  Suicidal Thoughts:  No  Homicidal Thoughts:  No  Memory:  Immediate;   Fair Recent;   Poor Remote;   Fair  Judgement:  Impaired  Insight:  Shallow  Psychomotor Activity:  Increased and Restlessness  Concentration:  Concentration: Poor  Recall:  Poor  Fund of Knowledge:  Poor  Language:  Fair  Akathisia:  No  Handed:  Right  AIMS (if indicated):     Assets:  Resilience  ADL's:  Intact  Cognition:  Impaired,  Mild    Sleep:        Treatment Plan Summary: Daily contact with patient to assess and evaluate symptoms and progress in treatment, Medication management and Plan Continuing on IVC for now and I will reevaluate tomorrow to see how she is doing. At this point it's possible that she may be recovering to the point that she will not necessarily need inpatient psychiatric treatment. Continue current Seroquel.  Disposition: Supportive therapy provided about ongoing stressors.  Mordecai Rasmussen, MD 08/28/2016 5:48 PM

## 2016-08-28 NOTE — Progress Notes (Signed)
[place] INFECTIOUS DISEASE PROGRESS NOTE Date of Admission:  08/25/2016     ID: Mary Mcdonald is a 51 y.o. female with osteomyelitis L second toe, cellulitis Principal Problem:   Bipolar I disorder, most recent episode (or current) manic ([place]) Active Problems:   Cellulitis   Subjective: No fevers, had L toe amp 4/10   ROS  Eleven systems are reviewed and negative except per hpi  Medications:  Antibiotics Given (last 72 hours)    Date/Time Action Medication Dose Rate   08/26/16 0026 Given   vancomycin (VANCOCIN) IVPB 1000 mg/200 mL premix 1,000 mg 200 mL/hr   08/26/16 0615 Given   piperacillin-tazobactam (ZOSYN) IVPB 3.375 g 3.375 g 12.5 mL/hr   08/26/16 0827 Given   vancomycin (VANCOCIN) IVPB 1000 mg/200 mL premix 1,000 mg 200 mL/hr   08/26/16 1351 Given   piperacillin-tazobactam (ZOSYN) IVPB 3.375 g 3.375 g 12.5 mL/hr   08/26/16 1549 Given   vancomycin (VANCOCIN) IVPB 1000 mg/200 mL premix 1,000 mg 200 mL/hr   08/26/16 2226 Given   piperacillin-tazobactam (ZOSYN) IVPB 3.375 g 3.375 g 12.5 mL/hr   08/26/16 2226 Given   vancomycin (VANCOCIN) IVPB 1000 mg/200 mL premix 1,000 mg 200 mL/hr   08/27/16 0942 Given  [pt was at MRI]   vancomycin (VANCOCIN) 1,250 mg in sodium chloride 0.9 % 250 mL IVPB 1,250 mg 166.7 mL/hr   08/27/16 1424 Given   piperacillin-tazobactam (ZOSYN) IVPB 3.375 g 3.375 g 12.5 mL/hr   08/27/16 1720 Given   vancomycin (VANCOCIN) 1,250 mg in sodium chloride 0.9 % 250 mL IVPB 1,250 mg 166.7 mL/hr   08/27/16 2148 Given   piperacillin-tazobactam (ZOSYN) IVPB 3.375 g 3.375 g 12.5 mL/hr   08/28/16 0205 Given  [iv traffic]   vancomycin (VANCOCIN) 1,250 mg in sodium chloride 0.9 % 250 mL IVPB 1,250 mg 166.7 mL/hr   08/28/16 0524 Given   piperacillin-tazobactam (ZOSYN) IVPB 3.375 g 3.375 g 12.5 mL/hr   08/28/16 0928 Given   vancomycin (VANCOCIN) 1,250 mg in sodium chloride 0.9 % 250 mL IVPB 1,250 mg 166.7 mL/hr   08/28/16 1232 Given   piperacillin-tazobactam (ZOSYN) IVPB 3.375 g 3.375 g 100 mL/hr   08/28/16 1246 Given   piperacillin-tazobactam (ZOSYN) IVPB 3.375 g 3.375 g      . clonazePAM  2 mg Oral QHS  . enoxaparin (LOVENOX) injection  40 mg Subcutaneous Q24H  . insulin aspart  0-5 Units Subcutaneous QHS  . insulin aspart  0-9 Units Subcutaneous TID WC  . levothyroxine  25 mcg Oral Q0600  . lisinopril  5 mg Oral Daily  . nicotine  21 mg Transdermal Daily  . piperacillin-tazobactam (ZOSYN)  IV  3.375 g Intravenous Q8H  . QUEtiapine  100 mg Oral QHS  . vancomycin  1,250 mg Intravenous Q8H    Objective: Vital signs in last 24 hours: Temp:  [96.7 F (35.9 C)-98.8 F (37.1 C)] 97.9 F (36.6 C) (04/10 1455) Pulse Rate:  [67-82] 82 (04/10 1455) Resp:  [11-20] 16 (04/10 1455) BP: (115-216)/(61-97) 143/68 (04/10 1455) SpO2:  [99 %-100 %] 100 % (04/10 1455) Constitutional:  lying in bed,  dishelved HENT: [place]/AT, PERRLA, no scleral icterus Mouth/Throat: Oropharynx is clear and moist. No oropharyngeal exudate.  Cardiovascular: Normal rate, regular rhythm and normal heart sounds. Pulmonary/Chest: Effort normal and breath sounds normal. No respiratory distress.  has no wheezes.  Neck = supple, no nuchal rigidity Abdominal: Soft. Bowel sounds are normal.  exhibits no distension. There is no tenderness.  Lymphadenopathy: no cervical  adenopathy. No axillary adenopathy Neurological: alert and oriented to person, place, and time.  Ext 2+ edema LLE to mid shin Skin: L foot wrapped after surgery today  Psychiatric: calm Lab Results  Recent Labs  08/26/16 0457  WBC 7.3  HGB 11.2*  HCT 34.4*  NA 140  K 3.4*  CL 107  CO2 29  BUN 9  CREATININE 0.44   Lab Results  Component Value Date   ESRSEDRATE 21 08/26/2016   Lab Results  Component Value Date   CRP <0.8 08/27/2016   Microbiology: Results for orders placed or performed during the hospital encounter of 08/25/16  Blood Culture (routine x 2)     Status: None  (Preliminary result)   Collection Time: 08/25/16  4:04 PM  Result Value Ref Range Status   Specimen Description BLOOD  L AC  Final   Special Requests BOTTLES DRAWN AEROBIC AND ANAEROBIC  BCAV  Final   Culture NO GROWTH 3 DAYS  Final   Report Status PENDING  Incomplete  Blood Culture (routine x 2)     Status: None (Preliminary result)   Collection Time: 08/25/16  4:04 PM  Result Value Ref Range Status   Specimen Description BLOOD R HAND  Final   Special Requests BOTTLES DRAWN AEROBIC AND ANAEROBIC  BCAV  Final   Culture NO GROWTH 3 DAYS  Final   Report Status PENDING  Incomplete  Urine culture     Status: Abnormal   Collection Time: 08/26/16 12:30 AM  Result Value Ref Range Status   Specimen Description URINE, RANDOM  Final   Special Requests NONE  Final   Culture (A)  Final    <10,000 COLONIES/mL INSIGNIFICANT GROWTH Performed at [facility], [address] [place], [place] [address]    Report Status 08/27/2016 FINAL  Final  Aerobic Culture (superficial specimen)     Status: None (Preliminary result)   Collection Time: 08/26/16  6:15 PM  Result Value Ref Range Status   Specimen Description FOOT LEFT  Final   Special Requests NONE  Final   Gram Stain   Final    FEW WBC PRESENT, PREDOMINANTLY PMN RARE GRAM POSITIVE COCCI IN PAIRS    Culture   Final    CULTURE REINCUBATED FOR BETTER GROWTH Performed at [facility], [address] [place], [place] [address]    Report Status PENDING  Incomplete    Studies/Results: Mr Foot Left Wo Contrast  Result Date: 08/27/2016 CLINICAL DATA:  Left foot wounds with an ulceration on the second toe. Redness, pain and swelling of the left foot. Elevated white blood cell count. EXAM: MRI OF THE LEFT FOOT WITHOUT CONTRAST TECHNIQUE: Multiplanar, multisequence MR imaging of the left foot Was performed. No intravenous contrast was administered. COMPARISON:  Plain films left foot 08/25/2016. FINDINGS: Bones/Joint/Cartilage Fracture of  the head of the proximal phalanx of the great toe is identified as seen on the prior plain films. The fracture is transverse in orientation through the distal metaphysis with a longitudinal component through the central aspect the articular surface. The articular surface is divided into 2 fragments. The distal fracture fragments are superiorly displaced 1 shaft width. There is little to no marrow edema about the fracture most consistent with chronic injury. Marked marrow edema is present throughout the distal phalanx of the great toe. There is also marked marrow edema throughout the middle and distal phalanges of the second toe. A milder degree of marrow edema is seen in the head   of the second metatarsal with flattening of the articular surface consistent with Freiberg's infraction. Mildly increased T2 signal in all other imaged bones is identified without focal abnormality may be due to stress change or related to technical factors on the exam. Ligaments Intact. Muscles and Tendons Intact. Soft tissues Intense subcutaneous edema is present over the dorsum of the foot. Fluid is seen between the heads of the first and second metatarsals. Small second and third MTP joint effusions are noted. IMPRESSION: Marked marrow edema in the distal phalanx of the great toe and middle and distal phalanges of the second toe is most consistent osteomyelitis. Fracture of the head of the proximal phalanx of the great toe appears remote. 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 Intense subcutaneous edema over the dorsum of the foot compatible with cellulitis. No abscess is identified. Fluid between the heads of the first and second metatarsals consistent with intermetatarsal bursitis. Electronically Signed   By: Inge Rise M.D.   On: 08/27/2016 09:39    Assessment/Plan: Kailly Richoux is a 51 y.o. female with cellulitis of L foot as well as osteomyelitis  of 2nd toe and possibly 1st toe Cultures pending. S/p surgery 4/10 with removal of 2nd toe.  ESR only 21, CRP <0.8. HIV negative She has well controlled DM (A1c 6.7) but seems to have PN as I can examine the infected toe without pain.  I am at this point somewhat underwhelmed given that the inflammatory markers are so low and there does not seem to be much residual infected bone remaining after surgery   Recommendations  I think we can avoid IV abx at this point if podiatry agrees. She would be a poor picc candidate given her psych illness.  Would suggest 2-4 weeks oral abx therapy with abx selection depending on culture results and duration depending on how the site is progressing at 2 weeks.   Thank you very much for the consult. Will follow with you.  Derick Seminara P   08/28/2016, 3:21 PM

## 2016-08-28 NOTE — Op Note (Signed)
Date of operation: 08/28/2016.  Surgeon: Durward Fortes.  Preoperative diagnosis: Osteomyelitis left second toe.  Postoperative diagnosis: Same.  Procedure: Amputation left second toe.  Anesthesia: Local Mac.  Hemostasis: Pneumatic tourniquet left ankle 250 mmHg.  Estimated blood loss: Less than 5 cc.  Cultures: Bone culture distal phalanx left second toe.  Pathology: Left second toe.  Operative indications: This is a 51 year old female with recent development of ulceration on her left second toe. X-rays and MRI were suggestive of osteomyelitis of the second toe and decision was made for amputation of the entire left second toe.  Operative procedure: Patient was taken to the operating room and placed on the table in the supine position. Following satisfactory sedation the left foot was anesthetized with 10 cc of 0.5% bupivacaine plain around the second metatarsal. A pneumatic tourniquet was applied at the level of the left ankle and the foot was prepped and draped in the usual sterile fashion.the foot was exsanguinated and the tourniquet inflated to 250 mmHg.   Attention was then directed to the dorsal aspect of the left foot where an elliptical incision was made coursing dorsal to plantar around the medial and lateral base of the second toe. Incision was carried sharply down to the level of the bone and periosteal dissection carried back to the level of the metatarsophalangeal joint where the toe was disarticulated and removed in toto. The wound was flushed with copious amounts of sterile saline. The incision was then closed using nylon 4-0 vertical mattress and simple interrupted sutures along with 3-0 simple interrupted nylon sutures.sterile dressing applied to the left foot. Patient tolerated the procedure and anesthesia well and was transported to the PACU with vital signs stable and in good condition.

## 2016-08-28 NOTE — Anesthesia Procedure Notes (Signed)
Date/Time: 08/28/2016 12:45 PM Performed by: Ginger Carne Pre-anesthesia Checklist: Patient identified, Emergency Drugs available, Suction available, Patient being monitored and Timeout performed Patient Re-evaluated:Patient Re-evaluated prior to inductionOxygen Delivery Method: Simple face mask

## 2016-08-28 NOTE — Progress Notes (Signed)
ANTIBIOTIC CONSULT NOTE - INITIAL  Pharmacy Consult for Vancomycin, Zosyn  Indication: cellulitis, possible osteomyelitis      Allergies  Allergen Reactions  . Lithium Rash    Patient Measurements: Height:  (175.3 cm) Weight: 150 lb 4.8 oz (68.2 kg) IBW/kg (Calculated) : 66.2 Adjusted Body Weight: 67 kg   Vital Signs: Temp: 98.8 F (37.1 C) (04/08 2354) Temp Source: Oral (04/08 2354) BP: 148/72 (04/08 2354) Pulse Rate: 78 (04/08 2354) Intake/Output from previous day: 04/08 0701 - 04/09 0700 In: 840 [P.O.:840] Out: -  Intake/Output from this shift: No intake/output data recorded.  Labs:  Recent Labs (last 2 labs)    Recent Labs  08/25/16 1436 08/26/16 0457  WBC 11.4* 7.3  HGB 11.4* 11.2*  PLT 371 328  CREATININE 0.52 0.44     Estimated Creatinine Clearance: 87.9 mL/min (by C-G formula based on SCr of 0.44 mg/dL).  Recent Labs (last 2 labs)    Recent Labs  08/27/16 0631  VANCOTROUGH 13*       Microbiology:       Recent Results (from the past 720 hour(s))  Blood Culture (routine x 2)     Status: None (Preliminary result)   Collection Time: 08/25/16  4:04 PM  Result Value Ref Range Status   Specimen Description BLOOD  L AC  Final   Special Requests BOTTLES DRAWN AEROBIC AND ANAEROBIC  BCAV  Final   Culture NO GROWTH < 24 HOURS  Final   Report Status PENDING  Incomplete  Blood Culture (routine x 2)     Status: None (Preliminary result)   Collection Time: 08/25/16  4:04 PM  Result Value Ref Range Status   Specimen Description BLOOD R HAND  Final   Special Requests BOTTLES DRAWN AEROBIC AND ANAEROBIC  BCAV  Final   Culture NO GROWTH < 24 HOURS  Final   Report Status PENDING  Incomplete    Medical History:     Past Medical History:  Diagnosis Date  . Bipolar 1 disorder (HCC)     Medications:  No prescriptions prior to admission.   Assessment: CrCl = 110 ml/min Ke = 0.096 hr-1 T1/2 = 7.2 hrs Vd =  47.7 L   Goal of Therapy:  Vancomycin trough level 15-20 mcg/ml  Plan:  Expected duration 7 days with resolution of temperature and/or normalization of WBC   Zosyn 3.375 gm IV X 1 given on 4/7 @ 16:40. Zosyn 3.375 gm IV Q8H EI ordered to start on 4/8 @ 22:00.  Vancomycin 1 gm IV X 1 given on 4/7 @ 17:00.  Vancomycin 1 mg IV Q8H ordered to start on 4/8 @ 0100, 6 hrs after 1st dose (stacked dosing). This pt will reach Css by 4/9 @ 0500. Will draw 1st trough on 4/9 @ 0630, which will be at Css.   Vancomycin trough 13 mcg/mL before sixth dose. MRI today for OM eval - will increase dose to 1.25 gm IV Q8H and recheck trough tomorrow morning. If MRI shows cellulitis we can likely stay at 1 gm IV Q8H but until OM r/o will increase to 1.25 gm IV Q8H.   4/10 @ 0500 VT 24 was drawn 5 hours too early. Will redraw VT today @ 1500 to reassess.  Thank you for this consult.  Thomasene Ripple, PharmD, BCPS Clinical Pharmacist 08/28/2016

## 2016-08-29 ENCOUNTER — Encounter: Payer: Self-pay | Admitting: Podiatry

## 2016-08-29 LAB — GLUCOSE, CAPILLARY
GLUCOSE-CAPILLARY: 214 mg/dL — AB (ref 65–99)
GLUCOSE-CAPILLARY: 171 mg/dL — AB (ref 65–99)
Glucose-Capillary: 209 mg/dL — ABNORMAL HIGH (ref 65–99)
Glucose-Capillary: 224 mg/dL — ABNORMAL HIGH (ref 65–99)
Glucose-Capillary: 285 mg/dL — ABNORMAL HIGH (ref 65–99)

## 2016-08-29 MED ORDER — CLONAZEPAM 0.5 MG PO TABS
1.0000 mg | ORAL_TABLET | Freq: Every day | ORAL | Status: DC
Start: 2016-08-29 — End: 2016-09-11
  Administered 2016-08-29 – 2016-09-10 (×13): 1 mg via ORAL
  Filled 2016-08-29 (×13): qty 2

## 2016-08-29 MED ORDER — AMLODIPINE BESYLATE 10 MG PO TABS
10.0000 mg | ORAL_TABLET | Freq: Every day | ORAL | Status: DC
  Administered 2016-08-29 – 2016-09-11 (×15): 10 mg via ORAL
  Filled 2016-08-29 (×15): qty 1

## 2016-08-29 MED ORDER — LEVOTHYROXINE SODIUM 100 MCG PO TABS
100.0000 ug | ORAL_TABLET | Freq: Every day | ORAL | Status: DC
  Administered 2016-08-30 – 2016-09-10 (×12): 100 ug via ORAL
  Filled 2016-08-29 (×13): qty 1

## 2016-08-29 MED ORDER — QUETIAPINE FUMARATE 100 MG PO TABS
200.0000 mg | ORAL_TABLET | Freq: Every day | ORAL | Status: DC
Start: 2016-08-29 — End: 2016-08-30
  Administered 2016-08-29: 200 mg via ORAL
  Filled 2016-08-29: qty 2

## 2016-08-29 NOTE — Progress Notes (Signed)
Patient ID: Mary Mcdonald, female   DOB: 03-20-1966, 51 y.o.   MRN: [id number]  [employer] PROGRESS NOTE  Liese Dizdarevic WUJ:[id number] DOB: 1965/12/29 DOA: 08/25/2016 PCP: No PCP Per Patient  HPI/Subjective:for OR today. Nothing by mouth now. Sitter at bedside because she is IVC. Objective: Vitals:   08/29/16 0446 08/29/16 0728  BP: (!) 180/89 (!) 141/77  Pulse: 79 89  Resp: 19 20  Temp: 97.3 F (36.3 C) 98.2 F (36.8 C)    Filed Weights   08/25/16 1417 08/25/16 2053  Weight: 72.6 kg (160 lb) 68.2 kg (150 lb 4.8 oz)    ROS: Review of Systems  Constitutional: Negative for chills and fever.  Eyes: Negative for blurred vision.  Respiratory: Negative for cough and shortness of breath.   Cardiovascular: Negative for chest pain.  Gastrointestinal: Negative for abdominal pain, constipation, diarrhea, nausea and vomiting.  Genitourinary: Negative for dysuria.  Musculoskeletal: Positive for joint pain.  Neurological: Negative for dizziness and headaches.    Exam: Physical Exam  HENT:  Nose: No mucosal edema.  Mouth/Throat: No oropharyngeal exudate.  Eyes: Conjunctivae, EOM and lids are normal. Pupils are equal, round, and reactive to light.  Neck: Carotid bruit is not present. No thyromegaly present.  Cardiovascular: Regular rhythm, S1 normal, S2 normal and normal heart sounds.   Respiratory: She has no decreased breath sounds. She has no wheezes. She has no rhonchi. She has no rales.  GI: Soft. Bowel sounds are normal. There is no tenderness.  Musculoskeletal:       Right ankle: She exhibits no swelling.       Left ankle: She exhibits decreased range of motion and swelling.  Neurological: She is alert. No cranial nerve deficit.  Skin: Skin is warm. Nails show no clubbing.  Both feet covered with dressings by podiatry  Psychiatric: She has a normal mood and affect. Her speech is not rapid and/or pressured.      Data Reviewed: Basic Metabolic Panel:  Recent Labs Lab  08/25/16 1436 08/26/16 0457  NA 140 140  K 3.3* 3.4*  CL 106 107  CO2 27 29  GLUCOSE 155* 121*  BUN 14 9  CREATININE 0.52 0.44  CALCIUM 9.3 8.9   Liver Function Tests:  Recent Labs Lab 08/25/16 1436  AST 26  ALT 33  ALKPHOS 91  BILITOT 0.8  PROT 7.4  ALBUMIN 3.8   CBC:  Recent Labs Lab 08/25/16 1436 08/26/16 0457  WBC 11.4* 7.3  HGB 11.4* 11.2*  HCT 35.4 34.4*  MCV 82.2 82.0  PLT 371 328     Recent Results (from the past 240 hour(s))  Blood Culture (routine x 2)     Status: None (Preliminary result)   Collection Time: 08/25/16  4:04 PM  Result Value Ref Range Status   Specimen Description BLOOD  L AC  Final   Special Requests BOTTLES DRAWN AEROBIC AND ANAEROBIC  BCAV  Final   Culture NO GROWTH 4 DAYS  Final   Report Status PENDING  Incomplete  Blood Culture (routine x 2)     Status: None (Preliminary result)   Collection Time: 08/25/16  4:04 PM  Result Value Ref Range Status   Specimen Description BLOOD R HAND  Final   Special Requests BOTTLES DRAWN AEROBIC AND ANAEROBIC  BCAV  Final   Culture NO GROWTH 4 DAYS  Final   Report Status PENDING  Incomplete  Urine culture     Status: Abnormal   Collection Time: 08/26/16 12:30 AM  Result Value Ref Range Status   Specimen Description URINE, RANDOM  Final   Special Requests NONE  Final   Culture (A)  Final    <10,000 COLONIES/mL INSIGNIFICANT GROWTH Performed at [facility], [address] [place], [place] [address]    Report Status 08/27/2016 FINAL  Final  Aerobic Culture (superficial specimen)     Status: None (Preliminary result)   Collection Time: 08/26/16  6:15 PM  Result Value Ref Range Status   Specimen Description FOOT LEFT  Final   Special Requests NONE  Final   Gram Stain   Final    FEW WBC PRESENT, PREDOMINANTLY PMN RARE GRAM POSITIVE COCCI IN PAIRS    Culture   Final    CULTURE REINCUBATED FOR BETTER GROWTH Performed at [facility], [address], [place], [place]  [address]    Report Status PENDING  Incomplete  Aerobic/Anaerobic Culture (surgical/deep wound)     Status: None (Preliminary result)   Collection Time: 08/28/16  1:03 PM  Result Value Ref Range Status   Specimen Description TOE  Final   Special Requests SECOND TOE LEFT FOOT BONE CULTURE  Final   Gram Stain   Final    MODERATE WBC PRESENT,BOTH PMN AND MONONUCLEAR NO ORGANISMS SEEN    Culture   Final    NO GROWTH < 24 HOURS Performed at [facility], [address], [place], [place] [address]    Report Status PENDING  Incomplete     Studies: No results found.  Scheduled Meds: . amLODipine  10 mg Oral Daily  . clonazePAM  2 mg Oral QHS  . enoxaparin (LOVENOX) injection  40 mg Subcutaneous Q24H  . insulin aspart  0-5 Units Subcutaneous QHS  . insulin aspart  0-9 Units Subcutaneous TID WC  . levothyroxine  25 mcg Oral Q0600  . lisinopril  5 mg Oral Daily  . nicotine  21 mg Transdermal Daily  . piperacillin-tazobactam (ZOSYN)  IV  3.375 g Intravenous Q8H  . QUEtiapine  100 mg Oral QHS  . vancomycin  1,250 mg Intravenous Q8H    Assessment/Plan:  1. 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 Jarrett Ables note. Once the cultures are available change antibiotics to oral and likely discharge of the weekend. 2.  3.  4. Type 2 diabetes mellitus. Good control hemoglobin A1c 6.7. On sliding scale. Hold Glucophage at this point 5. Essential hypertension on low-dose lisinopril, amlodipine. 6. Hypothyroidism unspecified on levothyroxine 7. Bipolar disorder. Patient on clonazepam daily at bedtime.  Psychiatry added seroquel. Continue IVC because of psychotic behavior when she came, sitter at bedside, Dr. Toni Amend following.  Code Status:     Code Status Orders        Start     Ordered   08/25/16 2057  Full code  Continuous      08/25/16 2056    Code Status History    Date Active Date Inactive Code Status Order ID Comments User Context   This patient has a current code status but no historical code status.      Disposition Plan: To be determined  Consultants:  Podiatry  Psychiatry  Antibiotics:  Vancomycin  Zosyn  Time spent: 22 minutes  [employer]

## 2016-08-29 NOTE — Anesthesia Postprocedure Evaluation (Signed)
Anesthesia Post Note  Patient: Mary Mcdonald  Procedure(s) Performed: Procedure(s) (LRB): AMPUTATION DIGIT 2nd toe (Left)  Patient location during evaluation: PACU Anesthesia Type: General Level of consciousness: awake and alert Pain management: pain level controlled Vital Signs Assessment: post-procedure vital signs reviewed and stable Respiratory status: spontaneous breathing, nonlabored ventilation, respiratory function stable and patient connected to nasal cannula oxygen Cardiovascular status: blood pressure returned to baseline and stable Postop Assessment: no signs of nausea or vomiting Anesthetic complications: no     Last Vitals:  Vitals:   08/29/16 0446 08/29/16 0728  BP: (!) 180/89 (!) 141/77  Pulse: 79 89  Resp: 19 20  Temp: 36.3 C 36.8 C    Last Pain:  Vitals:   08/29/16 0728  TempSrc: Oral  PainSc:                  [name] [name]

## 2016-08-29 NOTE — Consult Note (Signed)
[facility] Face-to-Face Psychiatry Consult   Reason for Consult:  Consult for 51 year old woman who presented to the emergency room last night allegedly for pain in her foot but who was clearly agitated psychotic and showing manic symptoms. Referring Physician:  Renae Gloss Patient Identification: Mary Mcdonald MRN:  [id number] Principal Diagnosis: Bipolar I disorder, most recent episode (or current) manic (HCC) Diagnosis:   Patient Active Problem List   Diagnosis Date Noted  . Bipolar I disorder, most recent episode (or current) manic (HCC) [F31.10] 08/26/2016  . Cellulitis [L03.90] 08/25/2016    Total Time spent with patient: 30 minutes  Subjective:   Mary Mcdonald is a 51 y.o. female patient admitted with "I just came from [place]".  Follow-up for Wednesday the 11th. Patient was awake and more interactive today. Talked to her for a longer time. She was able to give some information but also was pretty disorganized in her thinking. Still has a labile and often euphoric affect. Trying to pin her down on simple questions she gets irritable easily. She has not been aggressive in the hospital and is cooperative with her medical treatment. Today I spoke with her friend, Mary Mcdonald, whom she says she was living with. He tells me that he is her payee and that she has only been down here with him for a few days and that during that time she was very manic, distractive to property and that he is not able to handle her outside the hospital. This is at odds with what she is saying she expects to have happen when she leaves here. It sounds like being off her medicine as she has been as left her in a bad state and that she is not really understanding what her options are for stability outside the hospital. She has been taking her medicine since she is been here. I also spoke with [facility] and confirmed that the only prescription she has had filled locally work levothyroxine, lisinopril and  clonazepam.  HPI:  Patient interviewed. Chart reviewed. 51 year old woman presented to the emergency room last night. She was reportedly agitated disorganized and bizarre in her behavior even before getting it made it to the ER. Law enforcement reported that she had been urinating in public. Patient was complaining of pain in her left foot but then was only partially cooperative with evaluation of it. She was noted to be disorganized angry agitated and possibly threatening and had to be given forced medicines in the emergency room. On interview today the patient says that she just came here from [place] on Friday. Apparently she was planning to stay with someone in [place] but she says that they threw her out and told her to go to the shelter. Her story is rambling and disorganized and hard to make complete sense of. Patient indicates that she is not currently taking any psychiatric medicine. She is evasive about most specific psychiatric questions. Claims to not be having any particular problems with sleep or appetite. Denies suicidal or homicidal ideation. She says that she used to have hallucinations years ago but no longer does. She says the only psychiatric medicine she takes currently as clonazepam because it helps her to sleep. Denies that she is drinking or using any drugs although drug screen is positive for marijuana.  Social history: From what I can tell she is primarily located in [place]. She named several hospitals back there. At the same time she says that she was planning to relocate here  to [place]. Unclear exactly who she was planning to stay with.  Medical history: Has cellulitis. I'm unclear about the rest of her medical history. When I ask her about other illnesses she said yes to everything I ask. Couldn't name any specific medicines that she takes.  Substance abuse history: Denies use of alcohol says that she doesn't use any drugs denies any  history of substance abuse treatment. Drug screen positive for cannabis  Past Psychiatric History: Evasive about this and we don't have much in the way of any old records but she does indicate she's had bipolar disorder that has been diagnosed "all my life". She says she's had several hospitalizations in the past. Denies any history of suicide attempts. Admits to a history of violence. She says for years she took Depakote and Seroquel but doesn't do that anymore. She to lithium.  Risk to Self: Is patient at risk for suicide?:  (UTA at this time) Risk to Others:   Prior Inpatient Therapy:   Prior Outpatient Therapy:    Past Medical History:  Past Medical History:  Diagnosis Date  . Bipolar 1 disorder [facility])     Past Surgical History:  Procedure Laterality Date  . AMPUTATION Left 08/28/2016   Procedure: AMPUTATION DIGIT 2nd toe;  Surgeon: Linus Galas, DPM;  Location: [facility] ORS;  Service: Podiatry;  Laterality: Left;   Family History: History reviewed. No pertinent family history. Family Psychiatric  History: Does not know of any Social History:  History  Alcohol use Not on file     History  Drug use: Unknown    Social History   Social History  . Marital status: Widowed    Spouse name: N/A  . Number of children: N/A  . Years of education: N/A   Social History Main Topics  . Smoking status: Current Every Day Smoker  . Smokeless tobacco: Never Used  . Alcohol use None  . Drug use: Unknown  . Sexual activity: Not Asked   Other Topics Concern  . None   Social History Narrative  . None   Additional Social History:    Allergies:   Allergies  Allergen Reactions  . Lithium Rash    Labs:  Results for orders placed or performed during the hospital encounter of 08/25/16 (from the past 48 hour(s))  Glucose, capillary     Status: Abnormal   Collection Time: 08/27/16  5:00 PM  Result Value Ref Range   Glucose-Capillary 194 (H) 65 - 99 mg/dL  Rapid HIV screen (HIV 1/2 Ab+Ag)      Status: None   Collection Time: 08/27/16  7:36 PM  Result Value Ref Range   HIV-1 P24 Antigen - HIV24 NON REACTIVE NON REACTIVE   HIV 1/2 Antibodies NON REACTIVE NON REACTIVE   Interpretation (HIV Ag Ab)      A non reactive test result means that HIV 1 or HIV 2 antibodies and HIV 1 p24 antigen were not detected in the specimen.  C-reactive protein     Status: None   Collection Time: 08/27/16  7:36 PM  Result Value Ref Range   CRP <0.8 <1.0 mg/dL    Comment: Performed at [facility], [address] [place], [place] [address]  Glucose, capillary     Status: Abnormal   Collection Time: 08/27/16  9:38 PM  Result Value Ref Range   Glucose-Capillary 183 (H) 65 - 99 mg/dL   Comment 1 Notify RN   Vancomycin, trough     Status:  Abnormal   Collection Time: 08/28/16  4:59 AM  Result Value Ref Range   Vancomycin Tr 24 (HH) 15 - 20 ug/mL    Comment: CRITICAL RESULT CALLED TO, READ BACK BY AND VERIFIED WITH DAVID BESANTI AT 0606 ON 08/28/16 [facility].   Glucose, capillary     Status: Abnormal   Collection Time: 08/28/16  7:37 AM  Result Value Ref Range   Glucose-Capillary 106 (H) 65 - 99 mg/dL  Glucose, capillary     Status: None   Collection Time: 08/28/16 11:43 AM  Result Value Ref Range   Glucose-Capillary 83 65 - 99 mg/dL  Aerobic/Anaerobic Culture (surgical/deep wound)     Status: None (Preliminary result)   Collection Time: 08/28/16  1:03 PM  Result Value Ref Range   Specimen Description TOE    Special Requests SECOND TOE LEFT FOOT BONE CULTURE    Gram Stain      MODERATE WBC PRESENT,BOTH PMN AND MONONUCLEAR NO ORGANISMS SEEN    Culture      NO GROWTH < 24 HOURS Performed at [facility], [address], [place], [place] [address]    Report Status PENDING   Glucose, capillary     Status: None   Collection Time: 08/28/16  1:42 PM  Result Value Ref Range   Glucose-Capillary 85 65 - 99 mg/dL  Glucose, capillary     Status: Abnormal   Collection Time: 08/28/16  4:26  PM  Result Value Ref Range   Glucose-Capillary 219 (H) 65 - 99 mg/dL  Vancomycin, trough     Status: None   Collection Time: 08/28/16  5:26 PM  Result Value Ref Range   Vancomycin Tr 16 15 - 20 ug/mL  Glucose, capillary     Status: Abnormal   Collection Time: 08/28/16  9:25 PM  Result Value Ref Range   Glucose-Capillary 293 (H) 65 - 99 mg/dL  Glucose, capillary     Status: Abnormal   Collection Time: 08/29/16  7:28 AM  Result Value Ref Range   Glucose-Capillary 209 (H) 65 - 99 mg/dL  Glucose, capillary     Status: Abnormal   Collection Time: 08/29/16  7:52 AM  Result Value Ref Range   Glucose-Capillary 224 (H) 65 - 99 mg/dL  Glucose, capillary     Status: Abnormal   Collection Time: 08/29/16 11:20 AM  Result Value Ref Range   Glucose-Capillary 285 (H) 65 - 99 mg/dL    Current Facility-Administered Medications  Medication Dose Route Frequency Provider Last Rate Last Dose  . 0.9 %  sodium chloride infusion   Intravenous Continuous Rosaria Ferries, MD 50 mL/hr at 08/28/16 1226    . amLODipine (NORVASC) tablet 10 mg  10 mg Oral Daily Arnaldo Natal, MD   Stopped at 08/29/16 1000  . clonazePAM (KLONOPIN) tablet 1 mg  1 mg Oral QHS Audery Amel, MD      . enoxaparin (LOVENOX) injection 40 mg  40 mg Subcutaneous Q24H Debby Crosley, MD   40 mg at 08/28/16 2121  . HYDROcodone-acetaminophen (NORCO/VICODIN) 5-325 MG per tablet 1 tablet  1 tablet Oral Q4H PRN Oralia Manis, MD   1 tablet at 08/27/16 1542  . HYDROcodone-acetaminophen (NORCO/VICODIN) 5-325 MG per tablet 1-2 tablet  1-2 tablet Oral Q4H PRN Linus Galas, DPM      . insulin aspart (novoLOG) injection 0-5 Units  0-5 Units Subcutaneous QHS Alford Highland, MD   3 Units at 08/28/16 2132  . insulin aspart (novoLOG) injection 0-9 Units  0-9 Units Subcutaneous TID WC Alford Highland, MD   5 Units at 08/29/16 1136  . [START ON 08/30/2016] levothyroxine (SYNTHROID, LEVOTHROID) tablet 100 mcg  100 mcg Oral Q0600 Audery Amel, MD       . lisinopril (PRINIVIL,ZESTRIL) tablet 5 mg  5 mg Oral Daily Alford Highland, MD   5 mg at 08/29/16 0823  . LORazepam (ATIVAN) injection 2 mg  2 mg Intravenous Q6H PRN Debby Crosley, MD      . nicotine (NICODERM CQ - dosed in mg/24 hours) patch 21 mg  21 mg Transdermal Daily Houston Siren, MD   21 mg at 08/29/16 1610  . piperacillin-tazobactam (ZOSYN) IVPB 3.375 g  3.375 g Intravenous Q8H Rolm Baptise, RPH   3.375 g at 08/29/16 0513  . QUEtiapine (SEROQUEL) tablet 200 mg  200 mg Oral QHS Audery Amel, MD      . vancomycin (VANCOCIN) 1,250 mg in sodium chloride 0.9 % 250 mL IVPB  1,250 mg Intravenous Q8H Alford Highland, MD   1,250 mg at 08/29/16 9604    Musculoskeletal: Strength & Muscle Tone: within normal limits Gait & Station: normal Patient leans: N/A  Psychiatric Specialty Exam: Physical Exam  Nursing note and vitals reviewed. Constitutional: She appears well-developed and well-nourished.  HENT:  Head: Normocephalic and atraumatic.  Eyes: Conjunctivae are normal. Pupils are equal, round, and reactive to light.  Neck: Normal range of motion.  Cardiovascular: Regular rhythm and normal heart sounds.   Respiratory: Effort normal. No respiratory distress.  GI: Soft.  Musculoskeletal: Normal range of motion.       Feet:  Neurological: She is alert.  Skin: Skin is warm and dry.  Psychiatric: Her affect is labile and inappropriate. Her affect is not angry. Her speech is tangential. She is not agitated and not hyperactive. Thought content is paranoid. She expresses impulsivity. She expresses no homicidal and no suicidal ideation. She exhibits abnormal recent memory. She is inattentive.    Review of Systems  Constitutional: Negative.   HENT: Negative.   Eyes: Negative.   Respiratory: Negative.   Cardiovascular: Negative.   Gastrointestinal: Negative.   Musculoskeletal: Negative.   Skin: Negative.   Neurological: Negative.   Psychiatric/Behavioral: Positive for memory  loss. Negative for depression, hallucinations, substance abuse and suicidal ideas. The patient is not nervous/anxious and does not have insomnia.     Blood pressure (!) 141/77, pulse 89, temperature 98.2 F (36.8 C), temperature source Oral, resp. rate 20, height  (1.753 m), weight 68.2 kg (150 lb 4.8 oz), SpO2 99 %.Body mass index is 22.2 kg/m.  General Appearance: Casual  Eye Contact:  Fair  Speech:  Garbled and Pressured  Volume:  Increased  Mood:  Euphoric and Irritable  Affect:  Inappropriate and Labile  Thought Process:  Disorganized  Orientation:  Other:  She knew she was in a hospital and could tell me the name of it. She told me the year was 1967 but am not sure she really understood the question.  Thought Content:  Rumination  Suicidal Thoughts:  No  Homicidal Thoughts:  No  Memory:  Immediate;   Fair Recent;   Poor Remote;   Fair  Judgement:  Impaired  Insight:  Shallow  Psychomotor Activity:  Increased and Restlessness  Concentration:  Concentration: Poor  Recall:  Poor  Fund of Knowledge:  Poor  Language:  Fair  Akathisia:  No  Handed:  Right  AIMS (if indicated):     Assets:  Resilience  ADL's:  Intact  Cognition:  Impaired,  Mild  Sleep:        Treatment Plan Summary: Daily contact with patient to assess and evaluate symptoms and progress in treatment, Medication management and Plan Based on discussion with Mr. Susann Givens I think that it does seem like she is still not in the right frame of mind to be discharged and does not really have the kind of support she thinks she does. I will continue the involuntary commitment therefore and we will anticipate likely admission to the psychiatry ward once she is stable for discharge from medicine. I am going to increase her Seroquel up to 200 mg at night. Cut the Klonopin down to 1 mg which is closer to her previous dose. I have adjusted her Synthroid up to 100 g which was her previous dose. Spoke briefly with social  work and we will continue to follow-up with them since she is probably going to need significant social work assistance when she is ultimately ready for discharge.  Disposition: Recommend psychiatric Inpatient admission when medically cleared. Supportive therapy provided about ongoing stressors.  Mordecai Rasmussen, MD 08/29/2016 1:48 PM

## 2016-08-29 NOTE — Progress Notes (Signed)
Patient noncompliant with bed rest measures post op regardless of of 1:1 sitter. Patient education given.

## 2016-08-29 NOTE — Progress Notes (Signed)
1 Day Post-Op  Subjective: Patient seen. No real complaints of pain. States she has been up on the foot go to the bathroom.  Objective: Vital signs in last 24 hours: Temp:  [97.3 F (36.3 C)-98.2 F (36.8 C)] 98.2 F (36.8 C) (04/11 0728) Pulse Rate:  [67-90] 89 (04/11 0728) Resp:  [11-20] 20 (04/11 0728) BP: (132-194)/(66-95) 141/77 (04/11 0728) SpO2:  [98 %-100 %] 99 % (04/11 0728) Last BM Date: 08/28/16  Intake/Output from previous day: 04/10 0701 - 04/11 0700 In: 2120 [P.O.:720; I.V.:900; IV Piggyback:500] Out: 150 [Urine:150] Intake/Output this shift: Total I/O In: 240 [P.O.:240] Out: -   Bandage on the left foot has obviously been taken down and rewrapped. Patient states that the bandage fell off last night after a nurse came and looked at it and after walking to the bathroom and she had to try to wrap it back up. Upon removal edema and erythema in the left foot has significantly decreased. The incision at the left second toe amputation is well coapted with only minimal bleeding. No purulence. The wound beneath the left hallux was inspected and probed with a cotton-tipped applicator. Some serous drainage but no signs of purulence. Some obvious gross instability at the fracture on motion.  Lab Results:  No results for input(s): WBC, HGB, HCT, PLT in the last 72 hours. BMET No results for input(s): NA, K, CL, CO2, GLUCOSE, BUN, CREATININE, CALCIUM in the last 72 hours. PT/INR No results for input(s): LABPROT, INR in the last 72 hours. ABG No results for input(s): PHART, HCO3 in the last 72 hours.  Invalid input(s): PCO2, PO2  Studies/Results: No results found.  Anti-infectives: Anti-infectives    Start     Dose/Rate Route Frequency Ordered Stop   08/28/16 1230  piperacillin-tazobactam (ZOSYN) IVPB 3.375 g     3.375 g 100 mL/hr over 30 Minutes Intravenous  Once 08/28/16 1231 08/28/16 1302   08/27/16 0800  vancomycin (VANCOCIN) 1,250 mg in sodium chloride 0.9 % 250 mL  IVPB     1,250 mg 166.7 mL/hr over 90 Minutes Intravenous Every 8 hours 08/27/16 0751     08/26/16 0100  piperacillin-tazobactam (ZOSYN) IVPB 3.375 g     3.375 g 12.5 mL/hr over 240 Minutes Intravenous Every 8 hours 08/25/16 1652     08/25/16 2300  vancomycin (VANCOCIN) IVPB 1000 mg/200 mL premix  Status:  Discontinued     1,000 mg 200 mL/hr over 60 Minutes Intravenous Every 8 hours 08/25/16 2129 08/27/16 0751   08/25/16 2200  vancomycin (VANCOCIN) IVPB 1000 mg/200 mL premix  Status:  Discontinued     1,000 mg 200 mL/hr over 60 Minutes Intravenous Every 12 hours 08/25/16 1652 08/25/16 2056   08/25/16 1545  piperacillin-tazobactam (ZOSYN) IVPB 3.375 g     3.375 g 100 mL/hr over 30 Minutes Intravenous  Once 08/25/16 1530 08/25/16 1710   08/25/16 1545  vancomycin (VANCOCIN) IVPB 1000 mg/200 mL premix     1,000 mg 200 mL/hr over 60 Minutes Intravenous  Once 08/25/16 1530 08/25/16 1818      Assessment/Plan: s/p Procedure(s): AMPUTATION DIGIT 2nd toe (Left) Assessment: Stable status post amputation second toe.   Plan: Betadine and a sterile bandage applied at the amputation site. Gauze was packed into the plantar wound on the hallux. Discussed with the patient that we will have close eye on this and she is still at risk for amputation of the great toe if it shows any signs of worsening. Discussed with the patient that  she is not to take her bandage off at all. Reassess the wound either tomorrow or the next day.  LOS: 4 days    Ricci Barker 08/29/2016

## 2016-08-29 NOTE — Plan of Care (Signed)
Problem: Health Behavior/Discharge Planning: Goal: Ability to manage health-related needs will improve Outcome: Not Progressing Patient currently in manic phase, non compliant.

## 2016-08-29 NOTE — Plan of Care (Signed)
Problem: Pain Managment: Goal: General experience of comfort will improve Outcome: Not Progressing Patient without complaints of pain

## 2016-08-29 NOTE — Progress Notes (Signed)
[facility] INFECTI[facility] PROGRESS NOTE Date of Admission:  08/25/2016     ID: Mary Mcdonald is a 51 y.o. female with osteomyelitis L second toe, cellulitis Principal Problem:   Bipolar I disorder, most recent episode (or current) manic ([place]) Active Problems:   Cellulitis   Subjective: No fevers, had L toe amp 4/10. Was up walking on her foot and is mad that Dr [name] told her she could lose her foot if she walks on it.   ROS  Eleven systems are reviewed and negative except per hpi  Medications:  Antibiotics Given (last 72 hours)    Date/Time Action Medication Dose Rate   08/26/16 1549 Given   vancomycin (VANCOCIN) IVPB 1000 mg/200 mL premix 1,000 mg 200 mL/hr   08/26/16 2226 Given   piperacillin-tazobactam (ZOSYN) IVPB 3.375 g 3.375 g 12.5 mL/hr   08/26/16 2226 Given   vancomycin (VANCOCIN) IVPB 1000 mg/200 mL premix 1,000 mg 200 mL/hr   08/27/16 0942 Given  [pt was at MRI]   vancomycin (VANCOCIN) 1,250 mg in sodium chloride 0.9 % 250 mL IVPB 1,250 mg 166.7 mL/hr   08/27/16 1424 Given   piperacillin-tazobactam (ZOSYN) IVPB 3.375 g 3.375 g 12.5 mL/hr   08/27/16 1720 Given   vancomycin (VANCOCIN) 1,250 mg in sodium chloride 0.9 % 250 mL IVPB 1,250 mg 166.7 mL/hr   08/27/16 2148 Given   piperacillin-tazobactam (ZOSYN) IVPB 3.375 g 3.375 g 12.5 mL/hr   08/28/16 0205 Given  [iv traffic]   vancomycin (VANCOCIN) 1,250 mg in sodium chloride 0.9 % 250 mL IVPB 1,250 mg 166.7 mL/hr   08/28/16 0524 Given   piperacillin-tazobactam (ZOSYN) IVPB 3.375 g 3.375 g 12.5 mL/hr   08/28/16 0928 Given   vancomycin (VANCOCIN) 1,250 mg in sodium chloride 0.9 % 250 mL IVPB 1,250 mg 166.7 mL/hr   08/28/16 1232 Given   piperacillin-tazobactam (ZOSYN) IVPB 3.375 g 3.375 g 100 mL/hr   08/28/16 1246 Given   piperacillin-tazobactam (ZOSYN) IVPB 3.375 g 3.375 g    08/28/16 1739 Given   vancomycin (VANCOCIN) 1,250 mg in sodium chloride 0.9 % 250 mL IVPB 1,250 mg 166.7 mL/hr   08/28/16 2121 Given    piperacillin-tazobactam (ZOSYN) IVPB 3.375 g 3.375 g 12.5 mL/hr   08/29/16 0251 Given   vancomycin (VANCOCIN) 1,250 mg in sodium chloride 0.9 % 250 mL IVPB 1,250 mg 166.7 mL/hr   08/29/16 0513 Given   piperacillin-tazobactam (ZOSYN) IVPB 3.375 g 3.375 g 12.5 mL/hr   08/29/16 0939 Given   vancomycin (VANCOCIN) 1,250 mg in sodium chloride 0.9 % 250 mL IVPB 1,250 mg 166.7 mL/hr     . amLODipine  10 mg Oral Daily  . clonazePAM  1 mg Oral QHS  . enoxaparin (LOVENOX) injection  40 mg Subcutaneous Q24H  . insulin aspart  0-5 Units Subcutaneous QHS  . insulin aspart  0-9 Units Subcutaneous TID WC  . [START ON 08/30/2016] levothyroxine  100 mcg Oral Q0600  . lisinopril  5 mg Oral Daily  . nicotine  21 mg Transdermal Daily  . piperacillin-tazobactam (ZOSYN)  IV  3.375 g Intravenous Q8H  . QUEtiapine  200 mg Oral QHS  . vancomycin  1,250 mg Intravenous Q8H    Objective: Vital signs in last 24 hours: Temp:  [97.3 F (36.3 C)-98.2 F (36.8 C)] 98.2 F (36.8 C) (04/11 0728) Pulse Rate:  [77-90] 89 (04/11 0728) Resp:  [16-20] 20 (04/11 0728) BP: (132-180)/(66-89) 141/77 (04/11 0728) SpO2:  [98 %-100 %] 99 % (04/11 0728) Constitutional:  lying in bed,  dishelved HENT: [place]/AT, PERRLA, no scleral icterus Mouth/Throat: Oropharynx is clear and moist. No oropharyngeal exudate.  Cardiovascular: Normal rate, regular rhythm and normal heart sounds. Pulmonary/Chest: Effort normal and breath sounds normal. No respiratory distress.  has no wheezes.  Neck = supple, no nuchal rigidity Abdominal: Soft. Bowel sounds are normal.  exhibits no distension. There is no tenderness.  Lymphadenopathy: no cervical adenopathy. No axillary adenopathy Neurological: alert and oriented to person, place, and time.  Ext 2+ edema LLE to mid shin Skin: L foot wrapped after surgery  Psychiatric: agitated  Lab Results No results for input(s): WBC, HGB, HCT, NA, K, CL, CO2, BUN, CREATININE, GLU in the last 72  hours.  Invalid input(s): PLATELETS Lab Results  Component Value Date   ESRSEDRATE 21 08/26/2016   Lab Results  Component Value Date   CRP <0.8 08/27/2016   Microbiology: Results for orders placed or performed during the hospital encounter of 08/25/16  Blood Culture (routine x 2)     Status: None (Preliminary result)   Collection Time: 08/25/16  4:04 PM  Result Value Ref Range Status   Specimen Description BLOOD  L AC  Final   Special Requests BOTTLES DRAWN AEROBIC AND ANAEROBIC  BCAV  Final   Culture NO GROWTH 4 DAYS  Final   Report Status PENDING  Incomplete  Blood Culture (routine x 2)     Status: None (Preliminary result)   Collection Time: 08/25/16  4:04 PM  Result Value Ref Range Status   Specimen Description BLOOD R HAND  Final   Special Requests BOTTLES DRAWN AEROBIC AND ANAEROBIC  BCAV  Final   Culture NO GROWTH 4 DAYS  Final   Report Status PENDING  Incomplete  Urine culture     Status: Abnormal   Collection Time: 08/26/16 12:30 AM  Result Value Ref Range Status   Specimen Description URINE, RANDOM  Final   Special Requests NONE  Final   Culture (A)  Final    <10,000 COLONIES/mL INSIGNIFICANT GROWTH Performed at [facility], [address] [place], [place] [address]    Report Status 08/27/2016 FINAL  Final  Aerobic Culture (superficial specimen)     Status: None (Preliminary result)   Collection Time: 08/26/16  6:15 PM  Result Value Ref Range Status   Specimen Description FOOT LEFT  Final   Special Requests NONE  Final   Gram Stain   Final    FEW WBC PRESENT, PREDOMINANTLY PMN RARE GRAM POSITIVE COCCI IN PAIRS    Culture   Final    CULTURE REINCUBATED FOR BETTER GROWTH Performed at [facility], [place] [address], [place], [place] [address]    Report Status PENDING  Incomplete  Aerobic/Anaerobic Culture (surgical/deep wound)     Status: None (Preliminary result)   Collection Time: 08/28/16  1:03 PM  Result Value Ref Range Status   Specimen  Description TOE  Final   Special Requests SECOND TOE LEFT FOOT BONE CULTURE  Final   Gram Stain   Final    MODERATE WBC PRESENT,BOTH PMN AND MONONUCLEAR NO ORGANISMS SEEN    Culture   Final    NO GROWTH < 24 HOURS Performed at [facility], [place] [address] [place], [place] [address]    Report Status PENDING  Incomplete    Studies/Results: No results found.  Assessment/Plan: Mary Mcdonald is a 51 y.o. female with cellulitis of L foot as well as osteomyelitis of 2nd toe and  possibly 1st toe Cultures pending. S/Mcdonald surgery 4/10 with removal of 2nd toe.  ESR only 21, CRP <0.8. HIV negative She has well controlled DM (A1c 6.7) but seems to have PN as I can examine the infected toe without pain.  I am at this point somewhat underwhelmed given that the inflammatory markers are so low and there does not seem to be much residual infected bone remaining after surgery   Recommendations  I think we can avoid IV abx at this point if podiatry agrees. She would be a poor picc candidate given her psych illness.  Continue IV abx at this point until cultures available.  Would suggest 2-4 weeks oral abx therapy with abx selection depending on culture results and duration depending on how the site is progressing at 2 weeks.   Thank you very much for the consult. Will follow with you.  Mary Mcdonald   08/29/2016, 2:39 PM

## 2016-08-30 LAB — AEROBIC CULTURE W GRAM STAIN (SUPERFICIAL SPECIMEN)

## 2016-08-30 LAB — GLUCOSE, CAPILLARY
GLUCOSE-CAPILLARY: 130 mg/dL — AB (ref 65–99)
Glucose-Capillary: 159 mg/dL — ABNORMAL HIGH (ref 65–99)
Glucose-Capillary: 214 mg/dL — ABNORMAL HIGH (ref 65–99)
Glucose-Capillary: 238 mg/dL — ABNORMAL HIGH (ref 65–99)

## 2016-08-30 LAB — BASIC METABOLIC PANEL
Anion gap: 4 — ABNORMAL LOW (ref 5–15)
BUN: 15 mg/dL (ref 6–20)
CHLORIDE: 109 mmol/L (ref 101–111)
CO2: 26 mmol/L (ref 22–32)
Calcium: 9 mg/dL (ref 8.9–10.3)
Creatinine, Ser: 0.76 mg/dL (ref 0.44–1.00)
GFR calc Af Amer: >60 mL/min (ref >60)
GFR calc non Af Amer: >60 mL/min (ref >60)
GLUCOSE: 231 mg/dL — AB (ref 65–99)
POTASSIUM: 3.3 mmol/L — AB (ref 3.5–5.1)
Sodium: 139 mmol/L (ref 135–145)

## 2016-08-30 LAB — CULTURE, BLOOD (ROUTINE X 2)
CULTURE: NO GROWTH
Culture: NO GROWTH

## 2016-08-30 LAB — VANCOMYCIN, TROUGH
VANCOMYCIN TR: 26 ug/mL — AB (ref 15–20)
Vancomycin Tr: 23 ug/mL (ref 15–20)
Vancomycin Tr: 15 ug/mL (ref 15–20)

## 2016-08-30 LAB — SURGICAL PATHOLOGY

## 2016-08-30 LAB — AEROBIC CULTURE  (SUPERFICIAL SPECIMEN)

## 2016-08-30 MED ORDER — AMOXICILLIN-POT CLAVULANATE 875-125 MG PO TABS: 1.0000 | ORAL_TABLET | Freq: Two times a day (BID) | ORAL | 0 refills | Status: DC

## 2016-08-30 MED ORDER — CLONAZEPAM 1 MG PO TABS: 1.0000 mg | ORAL_TABLET | Freq: Every day | ORAL | 0 refills | Status: DC

## 2016-08-30 MED ORDER — VANCOMYCIN HCL IN DEXTROSE 750-5 MG/150ML-% IV SOLN
750.0000 mg | Freq: Three times a day (TID) | INTRAVENOUS | Status: DC
  Administered 2016-08-30 – 2016-09-02 (×8): 750 mg via INTRAVENOUS
  Filled 2016-08-30 (×12): qty 150

## 2016-08-30 MED ORDER — LEVOTHYROXINE SODIUM 100 MCG PO TABS: 100.0000 ug | ORAL_TABLET | Freq: Every day | ORAL | 0 refills | Status: DC

## 2016-08-30 MED ORDER — HYDROCODONE-ACETAMINOPHEN 5-325 MG PO TABS: 1.0000 | ORAL_TABLET | ORAL | 0 refills | Status: DC | PRN

## 2016-08-30 MED ORDER — AMLODIPINE BESYLATE 10 MG PO TABS: 10.0000 mg | ORAL_TABLET | Freq: Every day | ORAL | 0 refills | Status: DC

## 2016-08-30 MED ORDER — VANCOMYCIN HCL 10 G IV SOLR
1250.0000 mg | Freq: Three times a day (TID) | INTRAVENOUS | Status: DC
  Filled 2016-08-30 (×2): qty 1250

## 2016-08-30 MED ORDER — QUETIAPINE FUMARATE 200 MG PO TABS: 200.0000 mg | ORAL_TABLET | Freq: Every day | ORAL | 0 refills | Status: DC

## 2016-08-30 MED ORDER — LISINOPRIL 5 MG PO TABS: 5.0000 mg | ORAL_TABLET | Freq: Every day | ORAL | 0 refills | Status: DC

## 2016-08-30 MED ORDER — QUETIAPINE FUMARATE 300 MG PO TABS
300.0000 mg | ORAL_TABLET | Freq: Every day | ORAL | Status: DC
  Administered 2016-08-30 – 2016-09-01 (×3): 300 mg via ORAL
  Filled 2016-08-30 (×4): qty 1

## 2016-08-30 MED ORDER — DOXYCYCLINE HYCLATE 50 MG PO CAPS: 100.0000 mg | ORAL_CAPSULE | Freq: Two times a day (BID) | ORAL | 0 refills | Status: DC

## 2016-08-30 NOTE — Progress Notes (Signed)
ANTIBIOTIC CONSULT NOTE - INITIAL  Pharmacy Consult for Vancomycin, Zosyn  Indication: cellulitis, possible osteomyelitis      Allergies  Allergen Reactions  . Lithium Rash    Patient Measurements: Height:  (175.3 cm) Weight: 150 lb 4.8 oz (68.2 kg) IBW/kg (Calculated) : 66.2 Adjusted Body Weight: 67 kg   Vital Signs: Temp: 98.8 F (37.1 C) (04/08 2354) Temp Source: Oral (04/08 2354) BP: 148/72 (04/08 2354) Pulse Rate: 78 (04/08 2354) Intake/Output from previous day: 04/08 0701 - 04/09 0700 In: 840 [P.O.:840] Out: -  Intake/Output from this shift: No intake/output data recorded.  Labs:  Recent Labs (last 2 labs)    Recent Labs  08/25/16 1436 08/26/16 0457  WBC 11.4* 7.3  HGB 11.4* 11.2*  PLT 371 328  CREATININE 0.52 0.44     Estimated Creatinine Clearance: 87.9 mL/min (by C-G formula based on SCr of 0.44 mg/dL).  Recent Labs (last 2 labs)    Recent Labs  08/27/16 0631  VANCOTROUGH 13*      Microbiology:       Recent Results (from the past 720 hour(s))  Blood Culture (routine x 2) Status: None (Preliminary result)   Collection Time: 08/25/16 4:04 PM  Result Value Ref Range Status   Specimen Description BLOOD L AC  Final   Special Requests BOTTLES DRAWN AEROBIC AND ANAEROBIC BCAV  Final   Culture NO GROWTH < 24 HOURS  Final   Report Status PENDING  Incomplete  Blood Culture (routine x 2) Status: None (Preliminary result)   Collection Time: 08/25/16 4:04 PM  Result Value Ref Range Status   Specimen Description BLOOD R HAND  Final   Special Requests BOTTLES DRAWN AEROBIC AND ANAEROBIC BCAV  Final   Culture NO GROWTH < 24 HOURS  Final   Report Status PENDING  Incomplete    Medical History:     Past Medical History:  Diagnosis Date  . Bipolar 1 disorder (HCC)     Medications:  No prescriptions prior to admission.   Assessment: CrCl = 110 ml/min Ke = 0.096 hr-1 T1/2 =  7.2 hrs Vd = 47.7 L   Goal of Therapy:  Vancomycin trough level 15-20 mcg/ml  Plan: Expected duration 7 days with resolution of temperature and/or normalization of WBC  Zosyn 3.375 gm IV X 1 given on 4/7 @ 16:40. Zosyn 3.375 gm IV Q8H EI ordered to start on 4/8 @ 22:00.  Vancomycin 1 gm IV X 1 given on 4/7 @ 17:00.  Vancomycin 1 mg IV Q8H ordered to start on 4/8 @ 0100, 6 hrs after 1st dose (stacked dosing). This pt will reach Css by 4/9 @ 0500. Will draw 1st trough on 4/9 @ 0630, which will be at Css.   Vancomycin trough 13 mcg/mL before sixth dose. MRI today for OM eval - will increase dose to 1.25 gm IV Q8H and recheck trough tomorrow morning. If MRI shows cellulitis we can likely stay at 1 gm IV Q8H but until OM r/o will increase to 1.25 gm IV Q8H.   4/10 @ 0500 VT 24 was drawn 5 hours too early. Will redraw VT today @ 1500 to reassess.  4/10 @ 1726 16. Will continue with current dose of Vancomycin  IV every 8 hours. Will redraw VT after 5 doses to monitor.   4/12 @ 0130 VT 23. Level was drawn probably as the dose was infusing, will redraw another trough @ 0900 and reassess. No BMP in 4 days, will recheck BMP w/  am labs to assess renal function, UOP not documented.  4/13: VT @ ~08:58 was 26, likely drawn while drug infusing. Will recheck trough level .  Thank you for this consult. Demetrius Charity, PharmD  Clinical Pharmacist 08/30/2016

## 2016-08-30 NOTE — Progress Notes (Signed)
Student scanned vanc for am dose to be given, not infused due to order discontinued r/t vanc trough at 26. Received call from [facility] in Rx to make sure administration correction to be noted. MAR reflects no administration. Vanc trough redrawn.

## 2016-08-30 NOTE — Progress Notes (Signed)
2 Days Post-Op  Subjective: Patient seen. Not really having any pain with the left foot.  Objective: Vital signs in last 24 hours: Temp:  [97.9 F (36.6 C)-98.2 F (36.8 C)] 97.9 F (36.6 C) (04/12 0756) Pulse Rate:  [77-82] 77 (04/12 0756) Resp:  [18] 18 (04/12 0756) BP: (144-178)/(67-90) 178/90 (04/12 0756) SpO2:  [98 %-100 %] 100 % (04/12 0756) Last BM Date: 08/29/16  Intake/Output from previous day: 04/11 0701 - 04/12 0700 In: 2300 [P.O.:1200; IV Piggyback:1100] Out: -  Intake/Output this shift: Total I/O In: 240 [P.O.:240] Out: -   The bandages dry and intact on the left foot. Left in place. Remove bandage on the right foot and the previous ulcer blistered area has completely dried up. No significant drainage noted on the bandaging.  Lab Results:  No results for input(s): WBC, HGB, HCT, PLT in the last 72 hours. BMET  Recent Labs  08/30/16 0508  NA 139  K 3.3*  CL 109  CO2 26  GLUCOSE 231*  BUN 15  CREATININE 0.76  CALCIUM 9.0   PT/INR No results for input(s): LABPROT, INR in the last 72 hours. ABG No results for input(s): PHART, HCO3 in the last 72 hours.  Invalid input(s): PCO2, PO2  Studies/Results: No results found.  Anti-infectives: Anti-infectives    Start     Dose/Rate Route Frequency Ordered Stop   08/30/16 0000  amoxicillin-clavulanate (AUGMENTIN) 875-125 MG tablet     1 tablet Oral 2 times daily 08/30/16 1044     08/30/16 0000  doxycycline (VIBRAMYCIN) 50 MG capsule     100 mg Oral 2 times daily 08/30/16 1044     08/28/16 1230  piperacillin-tazobactam (ZOSYN) IVPB 3.375 g     3.375 g 100 mL/hr over 30 Minutes Intravenous  Once 08/28/16 1231 08/28/16 1302   08/27/16 0800  vancomycin (VANCOCIN) 1,250 mg in sodium chloride 0.9 % 250 mL IVPB  Status:  Discontinued     1,250 mg 166.7 mL/hr over 90 Minutes Intravenous Every 8 hours 08/27/16 0751 08/30/16 1000   08/26/16 0100  piperacillin-tazobactam (ZOSYN) IVPB 3.375 g     3.375 g 12.5  mL/hr over 240 Minutes Intravenous Every 8 hours 08/25/16 1652     08/25/16 2300  vancomycin (VANCOCIN) IVPB 1000 mg/200 mL premix  Status:  Discontinued     1,000 mg 200 mL/hr over 60 Minutes Intravenous Every 8 hours 08/25/16 2129 08/27/16 0751   08/25/16 2200  vancomycin (VANCOCIN) IVPB 1000 mg/200 mL premix  Status:  Discontinued     1,000 mg 200 mL/hr over 60 Minutes Intravenous Every 12 hours 08/25/16 1652 08/25/16 2056   08/25/16 1545  piperacillin-tazobactam (ZOSYN) IVPB 3.375 g     3.375 g 100 mL/hr over 30 Minutes Intravenous  Once 08/25/16 1530 08/25/16 1710   08/25/16 1545  vancomycin (VANCOCIN) IVPB 1000 mg/200 mL premix     1,000 mg 200 mL/hr over 60 Minutes Intravenous  Once 08/25/16 1530 08/25/16 1818      Assessment/Plan: s/p Procedure(s): AMPUTATION DIGIT 2nd toe (Left) Assessment: Stable status post amputation left second toe. Fracture second metatarsal bilateral and left hallux.   Plan: Dressing was left intact on the left foot. At this point she may go without a bandage on the right foot. She will wear surgical shoes on both feet. As far as the amputation there were good clean margins upon resection of the toe and she should be stable on just oral antibiotics. I will continue to periodically monitor the  amputation site and ulcerative area on the left hallux as she will be going down to the behavioral unit down stairs.  LOS: 5 days    Ricci Barker 08/30/2016

## 2016-08-30 NOTE — Discharge Summary (Signed)
Mary Mcdonald, is a 51 y.o. female  DOB Apr 27, 1966  MRN [id number].  Admission date:  08/25/2016  Admitting Physician  Mary Pray, MD  Discharge Date:  08/30/2016   Primary MD  No PCP Per Patient  Recommendations for primary care physician for things to follow:   Follow-up with Dr. Sampson Mcdonald in 3 weeks Follow-up with Dr. Linus Mcdonald from  Podiatry in 2 weeks   Admission Diagnosis  Swelling [R60.9] Closed displaced fracture of proximal phalanx of left great toe, initial encounter [S92.412A] Cellulitis of lower extremity, unspecified laterality [L03.119] Psychosis, unspecified psychosis type [F29]   Discharge Diagnosis  Swelling [R60.9] Closed displaced fracture of proximal phalanx of left great toe, initial encounter [S92.412A] Cellulitis of lower extremity, unspecified laterality [L03.119] Psychosis, unspecified psychosis type [F29]    Principal Problem:   Bipolar I disorder, most recent episode (or current) manic (HCC) Active Problems:   Cellulitis      Past Medical History:  Diagnosis Date  . Bipolar 1 disorder [place])     Past Surgical History:  Procedure Laterality Date  . AMPUTATION Left 08/28/2016   Procedure: AMPUTATION DIGIT 2nd toe;  Surgeon: Mary Mcdonald, DPM;  Location: [facility] ORS;  Service: Podiatry;  Laterality: Left;       History of present illness and  Hospital Course:     Kindly see H&P for history of present illness and admission details, please review complete Labs, Consult reports and Test reports for all details in brief  HPI  from the history and physical done on the day of admission 51year-old female patient with bipolar disorder initially on the psych side noted to have ulcer on the left second toe with some purulence, admitted to medicine for cellulitis of the left foot.  Hospital  Course   #1 left foot ileitis, osteomyelitis of the left second toe: Seen by ID, pulmonary artery, initially was on IV antibiotics, patient is taken to the Operating  room on April 10, patient had amputation of left second toe for osteomyelitis, continue IV antibiotics, with vanco.,, Zosyn till today, and operative cultures from left second toe showed no infection, spoke with Dr. Sampson Mcdonald recommended Augmentin 875 mg by mouth twice a day for 2 weeks, doxycycline 100 mg by mouth twice a day 2 weeks starting from today. Patient is medically stable for transfer to [facility] and arrangements are made.  2 agitation, psychosis, manic episodes, history of bipolar disorder, patient is made IVC, seen by psychiatry DR.Clapacs. He recommended inpatient psychiatric admission. Patient medically clear. Patient is on Klonopin, Seroquel continue them.  #3 hypothyroidism continue Synthroid 100 g daily   Discharge Condition: Stable patient is stable for transfer to [facility] when arrangements made. According to psych note patient is going to inpatient psych admission   Follow UP      Discharge Instructions  and  Discharge Medications      Allergies as of 08/30/2016      Reactions   Lithium Rash      Medication List    TAKE these medications   amLODipine 10 MG tablet Commonly known as:  NORVASC Take 1 tablet (10 mg total) by mouth daily. Start taking on:  08/31/2016   amoxicillin-clavulanate 875-125 MG tablet Commonly known as:  AUGMENTIN Take 1 tablet by mouth 2 (two) times daily.   clonazePAM 1 MG tablet Commonly known as:  KLONOPIN Take 1 tablet (1 mg total) by mouth at bedtime.   doxycycline 50 MG capsule Commonly known as:  VIBRAMYCIN Take 2  capsules (100 mg total) by mouth 2 (two) times daily.   HYDROcodone-acetaminophen 5-325 MG tablet Commonly known as:  NORCO/VICODIN Take 1 tablet by mouth every 4 (four) hours as needed for moderate pain.   levothyroxine 100 MCG tablet Commonly known  as:  SYNTHROID, LEVOTHROID Take 1 tablet (100 mcg total) by mouth daily at 6 (six) AM. Start taking on:  08/31/2016   lisinopril 5 MG tablet Commonly known as:  PRINIVIL,ZESTRIL Take 1 tablet (5 mg total) by mouth daily. Start taking on:  08/31/2016   QUEtiapine 200 MG tablet Commonly known as:  SEROQUEL Take 1 tablet (200 mg total) by mouth at bedtime.         Diet and Activity recommendation: See Discharge Instructions above   Consults obtained -ID,PSYCHIATRY   Major procedures and Radiology Reports - PLEASE review detailed and final reports for all details, in brief -     Mr Foot Left Wo Contrast  Result Date: 08/27/2016 CLINICAL DATA:  Left foot wounds with an ulceration on the second toe. Redness, pain and swelling of the left foot. Elevated white blood cell count. EXAM: MRI OF THE LEFT FOOT WITHOUT CONTRAST TECHNIQUE: Multiplanar, multisequence MR imaging of the left foot Was performed. No intravenous contrast was administered. COMPARISON:  Plain films left foot 08/25/2016. FINDINGS: Bones/Joint/Cartilage Fracture of the head of the proximal phalanx of the great toe is identified as seen on the prior plain films. The fracture is transverse in orientation through the distal metaphysis with a longitudinal component through the central aspect the articular surface. The articular surface is divided into 2 fragments. The distal fracture fragments are superiorly displaced 1 shaft width. There is little to no marrow edema about the fracture most consistent with chronic injury. Marked marrow edema is present throughout the distal phalanx of the great toe. There is also marked marrow edema throughout the middle and distal phalanges of the second toe. A milder degree of marrow edema is seen in the head of the second metatarsal with flattening of the articular surface consistent with Freiberg's infraction. Mildly increased T2 signal in all other imaged bones is identified without focal  abnormality may be due to stress change or related to technical factors on the exam. Ligaments Intact. Muscles and Tendons Intact. Soft tissues Intense subcutaneous edema is present over the dorsum of the foot. Fluid is seen between the heads of the first and second metatarsals. Small second and third MTP joint effusions are noted. IMPRESSION: Marked marrow edema in the distal phalanx of the great toe and middle and distal phalanges of the second toe is most consistent osteomyelitis. Fracture of the head of the proximal phalanx of the great toe appears remote. 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 Intense subcutaneous edema over the dorsum of the foot compatible with cellulitis. No abscess is identified. Fluid between the heads of the first and second metatarsals consistent with intermetatarsal bursitis. Electronically Signed   By: Drusilla Kanner M.D.   On: 08/27/2016 09:39   Dg Foot Complete Left  Result Date: 08/25/2016 CLINICAL DATA:  Left first toe tenderness and bruising.  No injury. EXAM: LEFT FOOT - COMPLETE 3+ VIEW COMPARISON:  None. FINDINGS: Findings suggest the fracture along the lateral aspect of the head of the first proximal phalanx. This is only seen on the AP view. Mild degenerate change of the midfoot. Minimal spurring over the posterior and inferior calcaneus. IMPRESSION: Minimally displaced fracture along the lateral  aspect of the head of the first proximal phalanx. Electronically Signed   By: Elberta Fortis M.D.   On: 08/25/2016 17:19   Dg Foot Complete Right  Result Date: 08/25/2016 CLINICAL DATA:  Right foot swelling.  No injury. EXAM: RIGHT FOOT COMPLETE - 3+ VIEW COMPARISON:  None. FINDINGS: Exam demonstrates amputation distal to the head of the second proximal phalanx as well as amputation distal to the of the third middle phalanx. There is mild irregularity with small bony fragment adjacent the distal aspect  of the third middle phalanx likely postsurgical, although cannot exclude a small chip fracture. Irregularity over the head of the second metatarsal on the AP view as cannot exclude a subtle fracture. Minimal degenerate change of the first MTP joint. Minimal spurring over the posterior inferior calcaneus. Mild degenerate change over the midfoot. IMPRESSION: Minimal irregularity over the head of the second metatarsal as cannot exclude a subtle fracture. 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 Mild irregularity with small bony fragment adjacent the distal aspect of the third middle phalanx likely postsurgical, although cannot exclude chip fracture. Electronically Signed   By: Elberta Fortis M.D.   On: 08/25/2016 17:16    Micro Results     Recent Results (from the past 240 hour(s))  Blood Culture (routine x 2)     Status: None   Collection Time: 08/25/16  4:04 PM  Result Value Ref Range Status   Specimen Description BLOOD  L AC  Final   Special Requests BOTTLES DRAWN AEROBIC AND ANAEROBIC  BCAV  Final   Culture NO GROWTH 5 DAYS  Final   Report Status 08/30/2016 FINAL  Final  Blood Culture (routine x 2)     Status: None   Collection Time: 08/25/16  4:04 PM  Result Value Ref Range Status   Specimen Description BLOOD R HAND  Final   Special Requests BOTTLES DRAWN AEROBIC AND ANAEROBIC  BCAV  Final   Culture NO GROWTH 5 DAYS  Final   Report Status 08/30/2016 FINAL  Final  Urine culture     Status: Abnormal   Collection Time: 08/26/16 12:30 AM  Result Value Ref Range Status   Specimen Description URINE, RANDOM  Final   Special Requests NONE  Final   Culture (A)  Final    <10,000 COLONIES/mL INSIGNIFICANT GROWTH Performed at [facility], [address] [place], [place] [address]    Report Status 08/27/2016 FINAL  Final  Aerobic Culture (superficial specimen)     Status: None (Preliminary result)   Collection Time: 08/26/16  6:15 PM  Result Value  Ref Range Status   Specimen Description FOOT LEFT  Final   Special Requests NONE  Final   Gram Stain   Final    FEW WBC PRESENT, PREDOMINANTLY PMN RARE GRAM POSITIVE COCCI IN PAIRS Performed at [facility], [address] [place], [place] [address]    Culture   Final    RARE STAPHYLOCOCCUS AUREUS MULTIPLE ORGANISMS PRESENT, NONE PREDOMINANT    Report Status PENDING  Incomplete  Aerobic/Anaerobic Culture (surgical/deep wound)     Status: None (Preliminary result)   Collection Time: 08/28/16  1:03 PM  Result Value Ref Range Status   Specimen Description TOE  Final   Special Requests SECOND TOE LEFT FOOT BONE CULTURE  Final   Gram Stain   Final    MODERATE WBC PRESENT,BOTH PMN AND MONONUCLEAR NO ORGANISMS SEEN    Culture   Final  NO GROWTH < 24 HOURS Performed at [facility], [address], [place], [place] [address]    Report Status PENDING  Incomplete       Today   Subjective:   Lysha Schrade today stableFor discharge. Objective:   Blood pressure (!) 178/90, pulse 77, temperature 97.9 F (36.6 C), temperature source Oral, resp. rate 18, height  (1.753 m), weight 68.2 kg (150 lb 4.8 oz), SpO2 100 %.   Intake/Output Summary (Last 24 hours) at 08/30/16 1044 Last data filed at 08/30/16 1032  Gross per 24 hour  Intake             2300 ml  Output                0 ml  Net             2300 ml    Exam Awake Alert, Oriented x 3, No new F.N deficits, Normal affect [place].AT,PERRAL Supple Neck,No JVD, No cervical lymphadenopathy appriciated.  Symmetrical Chest wall movement, Good air movement bilaterally, CTAB RRR,No Gallops,Rubs or new Murmurs, No Parasternal Heave +ve B.Sounds, Abd Soft, Non tender, No organomegaly appriciated, No rebound -guarding or rigidity. dressing present for the right leg. Data Review   CBC w Diff:  Lab Results  Component Value Date   WBC 7.3 08/26/2016   HGB 11.2 (L) 08/26/2016   HCT 34.4 (L) 08/26/2016   PLT 328  08/26/2016    CMP:  Lab Results  Component Value Date   NA 139 08/30/2016   K 3.3 (L) 08/30/2016   CL 109 08/30/2016   CO2 26 08/30/2016   BUN 15 08/30/2016   CREATININE 0.76 08/30/2016   PROT 7.4 08/25/2016   ALBUMIN 3.8 08/25/2016   BILITOT 0.8 08/25/2016   ALKPHOS 91 08/25/2016   AST 26 08/25/2016   ALT 33 08/25/2016  .   Total Time in preparing paper work, data evaluation and todays exam - 35 minutes  Apoorva Bugay M.D on 08/30/2016 at 10:44 AM    Note: This dictation was prepared with Dragon dictation along with smaller phrase technology. Any transcriptional errors that result from this process are unintentional.

## 2016-08-30 NOTE — Progress Notes (Signed)
ANTIBIOTIC CONSULT NOTE - INITIAL  Pharmacy Consult for Vancomycin, Zosyn  Indication: cellulitis, possible osteomyelitis      Allergies  Allergen Reactions  . Lithium Rash    Patient Measurements: Height:  (175.3 cm) Weight: 150 lb 4.8 oz (68.2 kg) IBW/kg (Calculated) : 66.2 Adjusted Body Weight: 67 kg   Vital Signs: Temp: 98.8 F (37.1 C) (04/08 2354) Temp Source: Oral (04/08 2354) BP: 148/72 (04/08 2354) Pulse Rate: 78 (04/08 2354) Intake/Output from previous day: 04/08 0701 - 04/09 0700 In: 840 [P.O.:840] Out: -  Intake/Output from this shift: No intake/output data recorded.  Labs:  Recent Labs (last 2 labs)    Recent Labs  08/25/16 1436 08/26/16 0457  WBC 11.4* 7.3  HGB 11.4* 11.2*  PLT 371 328  CREATININE 0.52 0.44     Estimated Creatinine Clearance: 87.9 mL/min (by C-G formula based on SCr of 0.44 mg/dL).  Recent Labs (last 2 labs)    Recent Labs  08/27/16 0631  VANCOTROUGH 13*      Microbiology:       Recent Results (from the past 720 hour(s))  Blood Culture (routine x 2) Status: None (Preliminary result)   Collection Time: 08/25/16 4:04 PM  Result Value Ref Range Status   Specimen Description BLOOD L AC  Final   Special Requests BOTTLES DRAWN AEROBIC AND ANAEROBIC BCAV  Final   Culture NO GROWTH < 24 HOURS  Final   Report Status PENDING  Incomplete  Blood Culture (routine x 2) Status: None (Preliminary result)   Collection Time: 08/25/16 4:04 PM  Result Value Ref Range Status   Specimen Description BLOOD R HAND  Final   Special Requests BOTTLES DRAWN AEROBIC AND ANAEROBIC BCAV  Final   Culture NO GROWTH < 24 HOURS  Final   Report Status PENDING  Incomplete    Medical History:     Past Medical History:  Diagnosis Date  . Bipolar 1 disorder (HCC)     Medications:  No prescriptions prior to admission.   Assessment: CrCl = 110 ml/min Ke = 0.096 hr-1 T1/2 =  7.2 hrs Vd = 47.7 L   Goal of Therapy:  Vancomycin trough level 15-20 mcg/ml  Plan: Expected duration 7 days with resolution of temperature and/or normalization of WBC  Zosyn 3.375 gm IV X 1 given on 4/7 @ 16:40. Zosyn 3.375 gm IV Q8H EI ordered to start on 4/8 @ 22:00.  Vancomycin 1 gm IV X 1 given on 4/7 @ 17:00.  Vancomycin 1 mg IV Q8H ordered to start on 4/8 @ 0100, 6 hrs after 1st dose (stacked dosing). This pt will reach Css by 4/9 @ 0500. Will draw 1st trough on 4/9 @ 0630, which will be at Css.   Vancomycin trough 13 mcg/mL before sixth dose. MRI today for OM eval - will increase dose to 1.25 gm IV Q8H and recheck trough tomorrow morning. If MRI shows cellulitis we can likely stay at 1 gm IV Q8H but until OM r/o will increase to 1.25 gm IV Q8H.   4/10 @ 0500 VT 24 was drawn 5 hours too early. Will redraw VT today @ 1500 to reassess.  4/10 @ 1726 16. Will continue with current dose of Vancomycin  IV every 8 hours. Will redraw VT after 5 doses to monitor.   4/12 @ 0130 VT 23. Level was drawn probably as the dose was infusing, will redraw another trough @ 0900 and reassess. No BMP in 4 days, will recheck BMP w/  am labs to assess renal function, UOP not documented.  Thank you for this consult.  Thomasene Ripple, PharmD, BCPS Clinical Pharmacist 08/30/2016

## 2016-08-30 NOTE — Progress Notes (Addendum)
ANTIBIOTIC CONSULT NOTE - INITIAL  Pharmacy Consult for Vancomycin, Zosyn  Indication: cellulitis, possible osteomyelitis      Allergies  Allergen Reactions  . Lithium Rash    Patient Measurements: Height:  (175.3 cm) Weight: 150 lb 4.8 oz (68.2 kg) IBW/kg (Calculated) : 66.2 Adjusted Body Weight: 67 kg   Vital Signs: Temp: 98.8 F (37.1 C) (04/08 2354) Temp Source: Oral (04/08 2354) BP: 148/72 (04/08 2354) Pulse Rate: 78 (04/08 2354) Intake/Output from previous day: 04/08 0701 - 04/09 0700 In: 840 [P.O.:840] Out: -  Intake/Output from this shift: No intake/output data recorded.  Labs:  Recent Labs (last 2 labs)    Recent Labs  08/25/16 1436 08/26/16 0457  WBC 11.4* 7.3  HGB 11.4* 11.2*  PLT 371 328  CREATININE 0.52 0.44     Estimated Creatinine Clearance: 87.9 mL/min (by C-G formula based on SCr of 0.44 mg/dL).  Recent Labs (last 2 labs)    Recent Labs  08/27/16 0631  VANCOTROUGH 13*      Microbiology:       Recent Results (from the past 720 hour(s))  Blood Culture (routine x 2) Status: None (Preliminary result)   Collection Time: 08/25/16 4:04 PM  Result Value Ref Range Status   Specimen Description BLOOD L AC  Final   Special Requests BOTTLES DRAWN AEROBIC AND ANAEROBIC BCAV  Final   Culture NO GROWTH < 24 HOURS  Final   Report Status PENDING  Incomplete  Blood Culture (routine x 2) Status: None (Preliminary result)   Collection Time: 08/25/16 4:04 PM  Result Value Ref Range Status   Specimen Description BLOOD R HAND  Final   Special Requests BOTTLES DRAWN AEROBIC AND ANAEROBIC BCAV  Final   Culture NO GROWTH < 24 HOURS  Final   Report Status PENDING  Incomplete    Medical History:     Past Medical History:  Diagnosis Date  . Bipolar 1 disorder (HCC)     Medications:  No prescriptions prior to admission.   Assessment: CrCl = 110 ml/min Ke = 0.096 hr-1 T1/2 =  7.2 hrs Vd = 47.7 L   Goal of Therapy:  Vancomycin trough level 15-20 mcg/ml  Plan: Expected duration 7 days with resolution of temperature and/or normalization of WBC  Zosyn 3.375 gm IV X 1 given on 4/7 @ 16:40. Zosyn 3.375 gm IV Q8H EI ordered to start on 4/8 @ 22:00.  Vancomycin 1 gm IV X 1 given on 4/7 @ 17:00.  Vancomycin 1 mg IV Q8H ordered to start on 4/8 @ 0100, 6 hrs after 1st dose (stacked dosing). This pt will reach Css by 4/9 @ 0500. Will draw 1st trough on 4/9 @ 0630, which will be at Css.   Vancomycin trough 13 mcg/mL before sixth dose. MRI today for OM eval - will increase dose to 1.25 gm IV Q8H and recheck trough tomorrow morning. If MRI shows cellulitis we can likely stay at 1 gm IV Q8H but until OM r/o will increase to 1.25 gm IV Q8H.   4/10 @ 0500 VT 24 was drawn 5 hours too early. Will redraw VT today @ 1500 to reassess.  4/10 @ 1726 16. Will continue with current dose of Vancomycin  IV every 8 hours. Will redraw VT after 5 doses to monitor.   4/12 @ 0130 VT 23. Level was drawn probably as the dose was infusing, will redraw another trough @ 0900 and reassess. No BMP in 4 days, will recheck BMP w/  am labs to assess renal function, UOP not documented.  4/12: VT @ ~08:58 was 26, likely drawn while drug infusing. Will recheck trough level .  4/12 Vanc level 15 . According to nurse patient never received Am dose even though MAR record show trough was drawn after start of AM vanc dose.  Based on two levels drawn today, new Ke: 0.092  and t1/2 ~8 hours. Will adjust dose to Vancomycin  IV every 8 hours. Will recheck trough level 4/13 prior to 4th dose.  Gardner Candle, PharmD, BCPS Clinical Pharmacist 08/30/2016 3:56 PM

## 2016-08-30 NOTE — [facility] Counselor (Signed)
This Clinical research associate spoke with Kamrar, RN Investment banker, corporate) who reports this pt will not be able to be transferred to the [facility] until tomorrow due to needing antibiotics (which she states she was told by Verita Schneiders).

## 2016-08-30 NOTE — Consult Note (Signed)
[facility] Face-to-Face Psychiatry Consult   Reason for Consult:  Consult for 51 year old woman who presented to the emergency room last night allegedly for pain in her foot but who was clearly agitated psychotic and showing manic symptoms. Referring Physician:  Leslye Peer Patient Identification: Colin Norment MRN:  [id number] Principal Diagnosis: Bipolar I disorder, most recent episode (or current) manic ([place]) Diagnosis:   Patient Active Problem List   Diagnosis Date Noted  . Bipolar I disorder, most recent episode (or current) manic ([place]) [F31.10] 08/26/2016  . Cellulitis [L03.90] 08/25/2016    Total Time spent with patient: 20 minutes  Subjective:   Annjeanette Sarwar is a 51 y.o. female patient admitted with "I just came from [place]".  Follow-up for Thursday the 12th. Patient seen. Spoke with hospitalist and nursing. Chart reviewed. 51 year old woman with a history of bipolar disorder who presented initially for agitation but was then found to have gangrenous feet and require surgery. On interview today the patient was reasonably pleasant but is still clearly having manic symptoms. Flight of ideas euphoric and labile mood. Tangential thinking. Not really able to articulate a clear and comprehensible plan for the future. As I mentioned yesterday for local payee feels that her mental state is far more than he can manage her right now. Patient is still under commitment. She is tolerating her medication which I had restarted. I did not tell her today that we were planning to transfer her downstairs to avoid making her more angry and agitated and causing practical problems.  HPI:  Patient interviewed. Chart reviewed. 51 year old woman presented to the emergency room last night. She was reportedly agitated disorganized and bizarre in her behavior even before getting it made it to the ER. Law enforcement reported that she had been urinating in public. Patient was complaining of pain in her left foot but then was  only partially cooperative with evaluation of it. She was noted to be disorganized angry agitated and possibly threatening and had to be given forced medicines in the emergency room. On interview today the patient says that she just came here from [place] on Friday. Apparently she was planning to stay with someone in [place] but she says that they threw her out and told her to go to the shelter. Her story is rambling and disorganized and hard to make complete sense of. Patient indicates that she is not currently taking any psychiatric medicine. She is evasive about most specific psychiatric questions. Claims to not be having any particular problems with sleep or appetite. Denies suicidal or homicidal ideation. She says that she used to have hallucinations years ago but no longer does. She says the only psychiatric medicine she takes currently as clonazepam because it helps her to sleep. Denies that she is drinking or using any drugs although drug screen is positive for marijuana.  Social history: From what I can tell she is primarily located in [place]. She named several hospitals back there. At the same time she says that she was planning to relocate here to [facility]. Unclear exactly who she was planning to stay with.  Medical history: Has cellulitis. I'm unclear about the rest of her medical history. When I ask her about other illnesses she said yes to everything I ask. Couldn't name any specific medicines that she takes.  Substance abuse history: Denies use of alcohol says that she doesn't use any drugs denies any history of substance abuse treatment. Drug screen positive for cannabis  Past Psychiatric History:  Evasive about this and we don't have much in the way of any old records but she does indicate she's had bipolar disorder that has been diagnosed "all my life". She says she's had several hospitalizations in the past. Denies any history of suicide attempts.  Admits to a history of violence. She says for years she took Depakote and Seroquel but doesn't do that anymore. She to lithium.  Risk to Self: Is patient at risk for suicide?:  (UTA at this time) Risk to Others:   Prior Inpatient Therapy:   Prior Outpatient Therapy:    Past Medical History:  Past Medical History:  Diagnosis Date  . Bipolar 1 disorder [facility])     Past Surgical History:  Procedure Laterality Date  . 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 No pertinent family history. Family Psychiatric  History: Does not know of any Social History:  History  Alcohol use Not on file     History  Drug use: Unknown    Social History   Social History  . Marital status: Widowed    Spouse name: N/A  . Number of children: N/A  . Years of education: N/A   Social History Main Topics  . Smoking status: Current Every Day Smoker  . Smokeless tobacco: Never Used  . Alcohol use None  . Drug use: Unknown  . Sexual activity: Not Asked   Other Topics Concern  . None   Social History Narrative  . None   Additional Social History:    Allergies:   Allergies  Allergen Reactions  . Lithium Rash    Labs:  Results for orders placed or performed during the hospital encounter of 08/25/16 (from the past 48 hour(s))  Vancomycin, trough     Status: None   Collection Time: 08/28/16  5:26 PM  Result Value Ref Range   Vancomycin Tr 16 15 - 20 ug/mL  Glucose, capillary     Status: Abnormal   Collection Time: 08/28/16  9:25 PM  Result Value Ref Range   Glucose-Capillary 293 (H) 65 - 99 mg/dL  Glucose, capillary     Status: Abnormal   Collection Time: 08/29/16  7:28 AM  Result Value Ref Range   Glucose-Capillary 209 (H) 65 - 99 mg/dL  Glucose, capillary     Status: Abnormal   Collection Time: 08/29/16  7:52 AM  Result Value Ref Range    Glucose-Capillary 224 (H) 65 - 99 mg/dL  Glucose, capillary     Status: Abnormal   Collection Time: 08/29/16 11:20 AM  Result Value Ref Range   Glucose-Capillary 285 (H) 65 - 99 mg/dL  Glucose, capillary     Status: Abnormal   Collection Time: 08/29/16  4:23 PM  Result Value Ref Range   Glucose-Capillary 214 (H) 65 - 99 mg/dL  Glucose, capillary     Status: Abnormal   Collection Time: 08/29/16  8:37 PM  Result Value Ref Range   Glucose-Capillary 171 (H) 65 - 99 mg/dL  Vancomycin, trough     Status: Abnormal   Collection Time: 08/30/16  1:33 AM  Result Value Ref Range   Vancomycin Tr 23 (HH) 15 - 20 ug/mL    Comment: CRITICAL RESULT CALLED TO, READ BACK BY AND VERIFIED WITH DAVID BESANTI ON 08/30/16 AT 0213 BY TLB   Basic metabolic panel     Status: Abnormal  Collection Time: 08/30/16  5:08 AM  Result Value Ref Range   Sodium 139 135 - 145 mmol/L   Potassium 3.3 (L) 3.5 - 5.1 mmol/L   Chloride 109 101 - 111 mmol/L   CO2 26 22 - 32 mmol/L   Glucose, Bld 231 (H) 65 - 99 mg/dL   BUN 15 6 - 20 mg/dL   Creatinine, Ser 0.76 0.44 - 1.00 mg/dL   Calcium 9.0 8.9 - 10.3 mg/dL   GFR calc non Af Amer >60 >60 mL/min   GFR calc Af Amer >60 >60 mL/min    Comment: (NOTE) The eGFR has been calculated using the CKD EPI equation. This calculation has not been validated in all clinical situations. eGFR's persistently <60 mL/min signify possible Chronic Kidney Disease.    Anion gap 4 (L) 5 - 15  Glucose, capillary     Status: Abnormal   Collection Time: 08/30/16  7:59 AM  Result Value Ref Range   Glucose-Capillary 159 (H) 65 - 99 mg/dL  Vancomycin, trough     Status: Abnormal   Collection Time: 08/30/16  8:58 AM  Result Value Ref Range   Vancomycin Tr 26 (HH) 15 - 20 ug/mL    Comment: CRITICAL RESULT CALLED TO, READ BACK BY AND VERIFIED WITH  CHRISTINE KATSOUDAS AT 3149 08/30/16 SDR   Glucose, capillary     Status: Abnormal   Collection Time: 08/30/16 11:40 AM  Result Value Ref Range    Glucose-Capillary 214 (H) 65 - 99 mg/dL  Vancomycin, trough     Status: None   Collection Time: 08/30/16  3:18 PM  Result Value Ref Range   Vancomycin Tr 15 15 - 20 ug/mL  Glucose, capillary     Status: Abnormal   Collection Time: 08/30/16  4:24 PM  Result Value Ref Range   Glucose-Capillary 130 (H) 65 - 99 mg/dL    Current Facility-Administered Medications  Medication Dose Route Frequency Provider Last Rate Last Dose  . 0.9 %  sodium chloride infusion   Intravenous Continuous Andria Frames, MD 50 mL/hr at 08/28/16 1226    . amLODipine (NORVASC) tablet 10 mg  10 mg Oral Daily Harrie Foreman, MD   10 mg at 08/30/16 0818  . clonazePAM (KLONOPIN) tablet 1 mg  1 mg Oral QHS Gonzella Lex, MD   1 mg at 08/29/16 2117  . enoxaparin (LOVENOX) injection 40 mg  40 mg Subcutaneous Q24H Debby Crosley, MD   40 mg at 08/29/16 2117  . HYDROcodone-acetaminophen (NORCO/VICODIN) 5-325 MG per tablet 1 tablet  1 tablet Oral Q4H PRN Lance Coon, MD   1 tablet at 08/30/16 0204  . HYDROcodone-acetaminophen (NORCO/VICODIN) 5-325 MG per tablet 1-2 tablet  1-2 tablet Oral Q4H PRN Sharlotte Alamo, DPM   2 tablet at 08/30/16 1354  . insulin aspart (novoLOG) injection 0-5 Units  0-5 Units Subcutaneous QHS Loletha Grayer, MD   3 Units at 08/28/16 2132  . insulin aspart (novoLOG) injection 0-9 Units  0-9 Units Subcutaneous TID WC Loletha Grayer, MD   3 Units at 08/30/16 1215  . levothyroxine (SYNTHROID, LEVOTHROID) tablet 100 mcg  100 mcg Oral Q0600 Gonzella Lex, MD   100 mcg at 08/30/16 0509  . lisinopril (PRINIVIL,ZESTRIL) tablet 5 mg  5 mg Oral Daily Loletha Grayer, MD   5 mg at 08/30/16 0818  . LORazepam (ATIVAN) injection 2 mg  2 mg Intravenous Q6H PRN Debby Crosley, MD      . nicotine (NICODERM CQ - dosed in  mg/24 hours) patch 21 mg  21 mg Transdermal Daily Henreitta Leber, MD   21 mg at 08/30/16 0820  . piperacillin-tazobactam (ZOSYN) IVPB 3.375 g  3.375 g Intravenous Q8H Merilyn Baba, RPH   3.375 g  at 08/30/16 1335  . QUEtiapine (SEROQUEL) tablet 200 mg  200 mg Oral QHS Gonzella Lex, MD   200 mg at 08/29/16 2117  . vancomycin (VANCOCIN) IVPB 750 mg/150 ml premix  750 mg Intravenous Q8H Sheema M Lenard Forth, RPH        Musculoskeletal: Strength & Muscle Tone: within normal limits Gait & Station: normal Patient leans: N/A  Psychiatric Specialty Exam: Physical Exam  Nursing note and vitals reviewed. Constitutional: She appears well-developed and well-nourished.  HENT:  Head: Normocephalic and atraumatic.  Eyes: Conjunctivae are normal. Pupils are equal, round, and reactive to light.  Neck: Normal range of motion.  Cardiovascular: Regular rhythm and normal heart sounds.   Respiratory: Effort normal. No respiratory distress.  GI: Soft.  Musculoskeletal: Normal range of motion.       Feet:  Neurological: She is alert.  Skin: Skin is warm and dry.  Psychiatric: Her affect is labile and inappropriate. Her affect is not angry. Her speech is tangential. She is not agitated and not hyperactive. Thought content is paranoid. She expresses impulsivity. She expresses no homicidal and no suicidal ideation. She exhibits abnormal recent memory. She is inattentive.    Review of Systems  Constitutional: Negative.   HENT: Negative.   Eyes: Negative.   Respiratory: Negative.   Cardiovascular: Negative.   Gastrointestinal: Negative.   Musculoskeletal: Negative.   Skin: Negative.   Neurological: Negative.   Psychiatric/Behavioral: Positive for memory loss. Negative for depression, hallucinations, substance abuse and suicidal ideas. The patient is not nervous/anxious and does not have insomnia.     Blood pressure 136/70, pulse 82, temperature 98.1 F (36.7 C), temperature source Oral, resp. rate 18, height 5' 9"  (1.753 m), weight 68.2 kg (150 lb 4.8 oz), SpO2 99 %.Body mass index is 22.2 kg/m.  General Appearance: Casual  Eye Contact:  Fair  Speech:  Garbled and Pressured  Volume:  Increased   Mood:  Euphoric and Irritable  Affect:  Inappropriate and Labile  Thought Process:  Disorganized  Orientation:  Other:  She knew she was in a hospital and could tell me the name of it. She told me the year was 1967 but am not sure she really understood the question.  Thought Content:  Rumination  Suicidal Thoughts:  No  Homicidal Thoughts:  No  Memory:  Immediate;   Fair Recent;   Poor Remote;   Fair  Judgement:  Impaired  Insight:  Shallow  Psychomotor Activity:  Increased and Restlessness  Concentration:  Concentration: Poor  Recall:  Poor  Fund of Knowledge:  Poor  Language:  Fair  Akathisia:  No  Handed:  Right  AIMS (if indicated):     Assets:  Resilience  ADL's:  Intact  Cognition:  Impaired,  Mild  Sleep:        Treatment Plan Summary: Daily contact with patient to assess and evaluate symptoms and progress in treatment, Medication management and Plan Slightly improved I think but still manic with disorganized thinking. Still under commitment. By tomorrow she should be off of the intravenous antibiotics. She has her left foot in a surgical boot. She is able to ambulate a little bit although she has been instructed to stay mostly off her feet. I would like to  plan for transfer to the psychiatric ward tomorrow. Will follow-up at that time. Meanwhile I'm going to increase her Seroquel up by another step tonight.  Disposition: Recommend psychiatric Inpatient admission when medically cleared. Supportive therapy provided about ongoing stressors.  Alethia Berthold, MD 08/30/2016 4:49 PM

## 2016-08-31 ENCOUNTER — Inpatient Hospital Stay: Admission: AD | Admit: 2016-08-31 | Payer: Medicaid Other | Source: Intra-hospital | Admitting: Psychiatry

## 2016-08-31 LAB — GLUCOSE, CAPILLARY
Glucose-Capillary: 104 mg/dL — ABNORMAL HIGH (ref 65–99)
Glucose-Capillary: 222 mg/dL — ABNORMAL HIGH (ref 65–99)
Glucose-Capillary: 150 mg/dL — ABNORMAL HIGH (ref 65–99)
Glucose-Capillary: 170 mg/dL — ABNORMAL HIGH (ref 65–99)

## 2016-08-31 LAB — VANCOMYCIN, TROUGH: Vancomycin Tr: 17 ug/mL (ref 15–20)

## 2016-08-31 MED ORDER — AMOXICILLIN-POT CLAVULANATE 875-125 MG PO TABS
1.0000 | ORAL_TABLET | Freq: Two times a day (BID) | ORAL | Status: DC
  Administered 2016-08-31: 1 via ORAL
  Filled 2016-08-31: qty 1

## 2016-08-31 MED ORDER — QUETIAPINE FUMARATE 300 MG PO TABS: 300.0000 mg | ORAL_TABLET | Freq: Every day | ORAL | 0 refills | Status: DC

## 2016-08-31 MED ORDER — AMOXICILLIN-POT CLAVULANATE 875-125 MG PO TABS: 1.0000 | ORAL_TABLET | Freq: Two times a day (BID) | ORAL | 0 refills | Status: DC

## 2016-08-31 MED ORDER — POTASSIUM CHLORIDE CRYS ER 20 MEQ PO TBCR
40.0000 meq | EXTENDED_RELEASE_TABLET | Freq: Once | ORAL | Status: AC
  Administered 2016-08-31: 40 meq via ORAL
  Filled 2016-08-31: qty 2

## 2016-08-31 NOTE — Progress Notes (Signed)
Pts blood culture positive for MRSA. Pt placed on contact precautions. MD Alberteen Spindle and MD Luberta Mutter notified. Orders received by MD Luberta Mutter to cancel discharge. Order placed.

## 2016-08-31 NOTE — ED Notes (Addendum)
Patient is to be admitted to [facility] by Dr. Toni Amend.  Attending Physician will be Dr. Jennet Maduro.   Patient has been assigned to room 314, by [facility] Charge Nurse [place].   Intake Paper Work has been signed and placed on patient chart.  ER staff is aware of the admission Tobi Bastos Patient's Nurse & [place]  Patient Access).  TTS has consulted with pt nurse who states that the pt potassium is still low 3.3 and the doctor will be called to place an order for the potassium. She also states that the pt is non weight paring and will require a wheel chair. This information has been conveyed to the [facility] charge nurse

## 2016-08-31 NOTE — Discharge Summary (Addendum)
Mary Mcdonald, is a 51 y.o. female  DOB Oct 13, 1965  MRN [id number].  Admission date:  08/25/2016  Admitting Physician  Gery Pray, MD  Discharge Date:  08/31/2016   Primary MD  No PCP Per Patient  Recommendations for primary care physician for things to follow:   Follow-up with Dr. Sampson Goon in 3 weeks Follow-up with Dr. Linus Galas from  Podiatry in 2 weeks   Admission Diagnosis  Swelling [R60.9] Closed displaced fracture of proximal phalanx of left great toe, initial encounter [S92.412A] Cellulitis of lower extremity, unspecified laterality [L03.119] Psychosis, unspecified psychosis type [F29]   Discharge Diagnosis  Swelling [R60.9] Closed displaced fracture of proximal phalanx of left great toe, initial encounter [S92.412A] Cellulitis of lower extremity, unspecified laterality [L03.119] Psychosis, unspecified psychosis type [F29]    Principal Problem:   Bipolar I disorder, most recent episode (or current) manic (HCC) Active Problems:   Cellulitis      Past Medical History:  Diagnosis Date  . Bipolar 1 disorder [facility])     Past Surgical History:  Procedure Laterality Date  . AMPUTATION Left 08/28/2016   Procedure: AMPUTATION DIGIT 2nd toe;  Surgeon: Linus Galas, DPM;  Location: [facility] ORS;  Service: Podiatry;  Laterality: Left;       History of present illness and  Hospital Course:     Kindly see H&P for history of present illness and admission details, please review complete Labs, Consult reports and Test reports for all details in brief  HPI  from the history and physical done on the day of admission 51year-old female patient with bipolar disorder initially on the psych side noted to have ulcer on the left second toe with some purulence, admitted to medicine for cellulitis of the left foot.  Hospital  Course   #1 left foot ileitis, osteomyelitis of the left second toe: Seen by ID, pulmonary artery, initially was on IV antibiotics, patient is taken to the Operating  room on April 10, patient had amputation of left second toe for osteomyelitis, continue IV antibiotics, with vanco.,, Zosyn till today, and operative cultures from left second toe showed no infection, spoke with Dr. Sampson Goon recommended Augmentin 875 mg by mouth twice a day for 2 weeks, doxycycline 100 mg by mouth twice a day 2 weeks starting from today. Patient is medically stable for transfer to [facility] and arrangements are made.  2 agitation, psychosis, manic episodes, history of bipolar disorder, patient is made IVC, seen by psychiatry DR.Clapacs. He recommended inpatient psychiatric admission. Patient medically clear. Patient is on Klonopin, Seroquel continue them. Seroquel dose has been increased to 300 at  she is on Klonopin 1 mg at  Bedtime,  #3 hypothyroidism continue Synthroid 100 g daily   Discharge Condition: Stable  patient is stable for transfer to [facility] when arrangements made. According to psych note patient is going to inpatient psych admission   Follow UP      Discharge Instructions  and  Discharge Medications      Allergies as of 08/31/2016      Reactions   Lithium Rash      Medication List    TAKE these medications   amLODipine 10 MG tablet Commonly known as:  NORVASC Take 1 tablet (10 mg total) by mouth daily.   amoxicillin-clavulanate 875-125 MG tablet Commonly known as:  AUGMENTIN Take 1 tablet by mouth every 12 (twelve) hours.   clonazePAM 1 MG tablet Commonly known as:  KLONOPIN Take 1 tablet (1 mg total) by mouth at  bedtime. What changed:  medication strength  how much to take  when to take this   doxycycline 50 MG capsule Commonly known as:  VIBRAMYCIN Take 2 capsules (100 mg total) by mouth 2 (two) times daily.   HYDROcodone-acetaminophen 5-325 MG tablet Commonly known as:   NORCO/VICODIN Take 1 tablet by mouth every 4 (four) hours as needed for moderate pain.   levothyroxine 100 MCG tablet Commonly known as:  SYNTHROID, LEVOTHROID Take 100 mcg by mouth daily before breakfast.   lisinopril 30 MG tablet Commonly known as:  PRINIVIL,ZESTRIL Take 30 mg by mouth daily.   QUEtiapine 300 MG tablet Commonly known as:  SEROQUEL Take 1 tablet (300 mg total) by mouth at bedtime.         Diet and Activity recommendation: See Discharge Instructions above   Consults obtained -ID,PSYCHIATRY   Major procedures and Radiology Reports - PLEASE review detailed and final reports for all details, in brief -     Mr Foot Left Wo Contrast  Result Date: 08/27/2016 CLINICAL DATA:  Left foot wounds with an ulceration on the second toe. Redness, pain and swelling of the left foot. Elevated white blood cell count. EXAM: MRI OF THE LEFT FOOT WITHOUT CONTRAST TECHNIQUE: Multiplanar, multisequence MR imaging of the left foot Was performed. No intravenous contrast was administered. COMPARISON:  Plain films left foot 08/25/2016. FINDINGS: Bones/Joint/Cartilage Fracture of the head of the proximal phalanx of the great toe is identified as seen on the prior plain films. The fracture is transverse in orientation through the distal metaphysis with a longitudinal component through the central aspect the articular surface. The articular surface is divided into 2 fragments. The distal fracture fragments are superiorly displaced 1 shaft width. There is little to no marrow edema about the fracture most consistent with chronic injury. Marked marrow edema is present throughout the distal phalanx of the great toe. There is also marked marrow edema throughout the middle and distal phalanges of the second toe. A milder degree of marrow edema is seen in the head of the second metatarsal with flattening of the articular surface consistent with Freiberg's infraction. Mildly increased T2 signal in all other  imaged bones is identified without focal abnormality may be due to stress change or related to technical factors on the exam. Ligaments Intact. Muscles and Tendons Intact. Soft tissues Intense subcutaneous edema is present over the dorsum of the foot. Fluid is seen between the heads of the first and second metatarsals. Small second and third MTP joint effusions are noted. IMPRESSION: Marked marrow edema in the distal phalanx of the great toe and middle and distal phalanges of the second toe is most consistent osteomyelitis. Fracture of the head of the proximal phalanx of the great toe appears remote. 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 Intense subcutaneous edema over the dorsum of the foot compatible with cellulitis. No abscess is identified. Fluid between the heads of the first and second metatarsals consistent with intermetatarsal bursitis. Electronically Signed   By: Drusilla Kanner M.D.   On: 08/27/2016 09:39   Dg Foot Complete Left  Result Date: 08/25/2016 CLINICAL DATA:  Left first toe tenderness and bruising.  No injury. EXAM: LEFT FOOT - COMPLETE 3+ VIEW COMPARISON:  None. FINDINGS: Findings suggest the fracture along the lateral aspect of the head of the first proximal phalanx. This is only seen on the AP view. Mild degenerate change of the midfoot. Minimal spurring over the  posterior and inferior calcaneus. IMPRESSION: Minimally displaced fracture along the lateral aspect of the head of the first proximal phalanx. Electronically Signed   By: Daniel  Boyle M.D.   On: 08/25/2016 17:19   Dg Foot Complete Right Elberta Fortiste: 08/25/2016 CLINICAL DATA:  Right foot swelling.  No injury. EXAM: RIGHT FOOT COMPLETE - 3+ VIEW COMPARISON:  None. FINDINGS: Exam demonstrates amputation distal to the head of the second proximal phalanx as well as amputation distal to the of the third middle phalanx. There is mild irregularity with small  bony fragment adjacent the distal aspect of the third middle phalanx likely postsurgical, although cannot exclude a small chip fracture. Irregularity over the head of the second metatarsal on the AP view as cannot exclude a subtle fracture. Minimal degenerate change of the first MTP joint. Minimal spurring over the posterior inferior calcaneus. Mild degenerate change over the midfoot. IMPRESSION: Minimal irregularity over the head of the second metatarsal as cannot exclude a subtle fracture. 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 Mild irregularity with small bony fragment adjacent the distal aspect of the third middle phalanx likely postsurgical, although cannot exclude chip fracture. Electronically Signed   By: Elberta Fortis M.D.   On: 08/25/2016 17:16    Micro Results     Recent Results (from the past 240 hour(s))  Blood Culture (routine x 2)     Status: None   Collection Time: 08/25/16  4:04 PM  Result Value Ref Range Status   Specimen Description BLOOD  L AC  Final   Special Requests BOTTLES DRAWN AEROBIC AND ANAEROBIC  BCAV  Final   Culture NO GROWTH 5 DAYS  Final   Report Status 08/30/2016 FINAL  Final  Blood Culture (routine x 2)     Status: None   Collection Time: 08/25/16  4:04 PM  Result Value Ref Range Status   Specimen Description BLOOD R HAND  Final   Special Requests BOTTLES DRAWN AEROBIC AND ANAEROBIC  BCAV  Final   Culture NO GROWTH 5 DAYS  Final   Report Status 08/30/2016 FINAL  Final  Urine culture     Status: Abnormal   Collection Time: 08/26/16 12:30 AM  Result Value Ref Range Status   Specimen Description URINE, RANDOM  Final   Special Requests NONE  Final   Culture (A)  Final    <10,000 COLONIES/mL INSIGNIFICANT GROWTH Performed at [facility], [address] [place], [place] [address]    Report Status 08/27/2016 FINAL  Final  Aerobic Culture (superficial specimen)     Status: None   Collection Time: 08/26/16   6:15 PM  Result Value Ref Range Status   Specimen Description FOOT LEFT  Final   Special Requests NONE  Final   Gram Stain   Final    FEW WBC PRESENT, PREDOMINANTLY PMN RARE GRAM POSITIVE COCCI IN PAIRS Performed at [facility], [address] [place], [place] [address]    Culture   Final    RARE METHICILLIN RESISTANT STAPHYLOCOCCUS AUREUS MULTIPLE ORGANISMS PRESENT, NONE PREDOMINANT    Report Status 08/30/2016 FINAL  Final   Organism ID, Bacteria METHICILLIN RESISTANT STAPHYLOCOCCUS AUREUS  Final      Susceptibility   Methicillin resistant staphylococcus aureus - MIC*    CIPROFLOXACIN >=8 RESISTANT Resistant     ERYTHROMYCIN >=8 RESISTANT Resistant     GENTAMICIN <=0.5 SENSITIVE Sensitive     OXACILLIN >=4 RESISTANT Resistant     TETRACYCLINE <=  1 SENSITIVE Sensitive     VANCOMYCIN 1 SENSITIVE Sensitive     TRIMETH/SULFA <=10 SENSITIVE Sensitive     CLINDAMYCIN <=0.25 SENSITIVE Sensitive     RIFAMPIN <=0.5 SENSITIVE Sensitive     Inducible Clindamycin NEGATIVE Sensitive     * RARE METHICILLIN RESISTANT STAPHYLOCOCCUS AUREUS  Aerobic/Anaerobic Culture (surgical/deep wound)     Status: None (Preliminary result)   Collection Time: 08/28/16  1:03 PM  Result Value Ref Range Status   Specimen Description TOE  Final   Special Requests SECOND TOE LEFT FOOT BONE CULTURE  Final   Gram Stain   Final    MODERATE WBC PRESENT,BOTH PMN AND MONONUCLEAR NO ORGANISMS SEEN    Culture   Final    NO GROWTH 3 DAYS Performed at [facility], [address], [place], [place] [address]    Report Status PENDING  Incomplete       Today   Subjective:   Mary Mcdonald today stableFor discharge. Objective:   Blood pressure (!) 160/86, pulse 87, temperature 99 F (37.2 C), temperature source Oral, resp. rate 18, height  (1.753 m), weight 68.2 kg (150 lb 4.8 oz), SpO2 100 %.   Intake/Output Summary (Last 24 hours) at 08/31/16 1320 Last data filed at 08/31/16 0956  Gross  per 24 hour  Intake          3799.17 ml  Output                0 ml  Net          3799.17 ml    Exam Awake Alert, Oriented x 3, No new F.N deficits, Normal affect [place].AT,PERRAL Supple Neck,No JVD, No cervical lymphadenopathy appriciated.  Symmetrical Chest wall movement, Good air movement bilaterally, CTAB RRR,No Gallops,Rubs or new Murmurs, No Parasternal Heave +ve B.Sounds, Abd Soft, Non tender, No organomegaly appriciated, No rebound -guarding or rigidity. dressing present for the right leg. Data Review   CBC w Diff:  Lab Results  Component Value Date   WBC 7.3 08/26/2016   HGB 11.2 (L) 08/26/2016   HCT 34.4 (L) 08/26/2016   PLT 328 08/26/2016    CMP:  Lab Results  Component Value Date   NA 139 08/30/2016   K 3.3 (L) 08/30/2016   CL 109 08/30/2016   CO2 26 08/30/2016   BUN 15 08/30/2016   CREATININE 0.76 08/30/2016   PROT 7.4 08/25/2016   ALBUMIN 3.8 08/25/2016   BILITOT 0.8 08/25/2016   ALKPHOS 91 08/25/2016   AST 26 08/25/2016   ALT 33 08/25/2016  .   Total Time in preparing paper work, data evaluation and todays exam - 35 minutes  Immaculate Crutcher M.D on 08/31/2016 at 1:20 PM    Note: This dictation was prepared with Dragon dictation along with smaller phrase technology. Any transcriptional errors that result from this process are unintentional.

## 2016-08-31 NOTE — Consult Note (Signed)
Psychiatry: Everything was in place to transfer this patient with bipolar disorder to the psychiatric service but I am now told by nurses on the medical ward that the patient is being refused for transfer. I am told that this is because she has MRSA positive in one of her cultures. This is a decision that is beyond my authority to intervene and.  This of course puts [place] in a difficult spot. Patient remains manic and confused and I have not seen evidence that she has a safe discharge plan. I attempted to reach her sister by telephone twice today and was only able to leave a voicemail message. Patient's commitment is scheduled to run out tomorrow but I am going to renew it today. If there is no change in the decision about psychiatric admission it may be the case that she will be stuck on the medical service until she is well enough to go home. I will sign this out over the weekend and follow-up after the weekend.

## 2016-08-31 NOTE — Care Management (Signed)
Reviewed CSW note. Patient has Medicaid. There are no additional resources available to patient with Medicaid.. Her prescriptions are $3 per prescription. Please consult RNCM if further needs arise

## 2016-08-31 NOTE — Progress Notes (Signed)
Mary Mcdonald Date of Admission:  08/25/2016     ID: Mary Mcdonald is a 51 y.o. female with osteomyelitis L second toe, cellulitis Principal Problem:   Bipolar I disorder, most recent episode (or current) manic ([place]) Active Problems:   Cellulitis   Subjective: No fevers, cx with MRSA so not dced to psych.   ROS  Eleven systems are reviewed and negative except per hpi  Medications:  Antibiotics Given (last 72 hours)    Date/Time Action Medication Dose Rate   08/28/16 1739 Given   vancomycin (VANCOCIN) 1,250 mg in sodium chloride 0.9 % 250 mL IVPB 1,250 mg 166.7 mL/hr   08/28/16 2121 Given   piperacillin-tazobactam (ZOSYN) IVPB 3.375 g 3.375 g 12.5 mL/hr   08/29/16 0251 Given   vancomycin (VANCOCIN) 1,250 mg in sodium chloride 0.9 % 250 mL IVPB 1,250 mg 166.7 mL/hr   08/29/16 0513 Given   piperacillin-tazobactam (ZOSYN) IVPB 3.375 g 3.375 g 12.5 mL/hr   08/29/16 1533 Given   piperacillin-tazobactam (ZOSYN) IVPB 3.375 g 3.375 g 12.5 mL/hr   08/29/16 1727 Given   vancomycin (VANCOCIN) 1,250 mg in sodium chloride 0.9 % 250 mL IVPB 1,250 mg 166.7 mL/hr   08/29/16 2117 Given   piperacillin-tazobactam (ZOSYN) IVPB 3.375 g 3.375 g 12.5 mL/hr   08/30/16 0048 Given   vancomycin (VANCOCIN) 1,250 mg in sodium chloride 0.9 % 250 mL IVPB 1,250 mg 166.7 mL/hr   08/30/16 0509 Given   piperacillin-tazobactam (ZOSYN) IVPB 3.375 g 3.375 g 12.5 mL/hr   08/30/16 0819 Given   vancomycin (VANCOCIN) 1,250 mg in sodium chloride 0.9 % 250 mL IVPB 1,250 mg 166.7 mL/hr   08/30/16 1335 Given   piperacillin-tazobactam (ZOSYN) IVPB 3.375 g 3.375 g 12.5 mL/hr   08/30/16 1722 Given   vancomycin (VANCOCIN) IVPB 750 mg/150 ml premix 750 mg 150 mL/hr   08/30/16 2159 Given   piperacillin-tazobactam (ZOSYN) IVPB 3.375 g 3.375 g 12.5 mL/hr   08/31/16 0133 Given   vancomycin (VANCOCIN) IVPB 750 mg/150 ml premix 750 mg 150 mL/hr   08/31/16 0500 Given   piperacillin-tazobactam  (ZOSYN) IVPB 3.375 g 3.375 g 12.5 mL/hr   08/31/16 1057 Given   vancomycin (VANCOCIN) IVPB 750 mg/150 ml premix 750 mg 150 mL/hr   08/31/16 1424 Given   piperacillin-tazobactam (ZOSYN) IVPB 3.375 g 3.375 g 12.5 mL/hr   08/31/16 1424 Given   amoxicillin-clavulanate (AUGMENTIN) 875-125 MG per tablet 1 tablet 1 tablet      . amLODipine  10 mg Oral Daily  . amoxicillin-clavulanate  1 tablet Oral Q12H  . clonazePAM  1 mg Oral QHS  . enoxaparin (LOVENOX) injection  40 mg Subcutaneous Q24H  . insulin aspart  0-5 Units Subcutaneous QHS  . insulin aspart  0-9 Units Subcutaneous TID WC  . levothyroxine  100 mcg Oral Q0600  . lisinopril  5 mg Oral Daily  . nicotine  21 mg Transdermal Daily  . piperacillin-tazobactam (ZOSYN)  IV  3.375 g Intravenous Q8H  . QUEtiapine  300 mg Oral QHS  . vancomycin  750 mg Intravenous Q8H    Objective: Vital signs in last 24 hours: Temp:  [98.1 F (36.7 C)-99 F (37.2 C)] 99 F (37.2 C) (04/13 0841) Pulse Rate:  [74-87] 87 (04/13 0841) Resp:  [18-20] 18 (04/13 0841) BP: (122-160)/(53-86) 160/86 (04/13 0841) SpO2:  [98 %-100 %] 100 % (04/13 0841) Constitutional:  lying in her chair, dressed in street clothes dishelved HENT: [place]/AT, PERRLA, no scleral icterus Mouth/Throat: Oropharynx  is clear and moist. No oropharyngeal exudate.  Cardiovascular: Normal rate, regular rhythm and normal heart sounds.  Pulmonary/Chest: Effort normal and breath sounds normal. No respiratory distress.  has no wheezes.  Neck = supple, no nuchal rigidity Abdominal: Soft. Bowel sounds are normal.  exhibits no distension. There is no tenderness.  Lymphadenopathy: no cervical adenopathy. No axillary adenopathy Neurological: alert and oriented to person, place, and time.  Ext 2+ edema LLE to mid shin Skin: L foot wrapped after surgery  Psychiatric: sleepy, a little lethargic  Lab Results  Recent Labs  08/30/16 0508  NA 139  K 3.3*  CL 109  CO2 26  BUN 15  CREATININE 0.76    Lab Results  Component Value Date   ESRSEDRATE 21 08/26/2016   Lab Results  Component Value Date   CRP <0.8 08/27/2016   Microbiology: Results for orders placed or performed during the hospital encounter of 08/25/16  Blood Culture (routine x 2)     Status: None   Collection Time: 08/25/16  4:04 PM  Result Value Ref Range Status   Specimen Description BLOOD  L AC  Final   Special Requests BOTTLES DRAWN AEROBIC AND ANAEROBIC  BCAV  Final   Culture NO GROWTH 5 DAYS  Final   Report Status 08/30/2016 FINAL  Final  Blood Culture (routine x 2)     Status: None   Collection Time: 08/25/16  4:04 PM  Result Value Ref Range Status   Specimen Description BLOOD R HAND  Final   Special Requests BOTTLES DRAWN AEROBIC AND ANAEROBIC  BCAV  Final   Culture NO GROWTH 5 DAYS  Final   Report Status 08/30/2016 FINAL  Final  Urine culture     Status: Abnormal   Collection Time: 08/26/16 12:30 AM  Result Value Ref Range Status   Specimen Description URINE, RANDOM  Final   Special Requests NONE  Final   Culture (A)  Final    <10,000 COLONIES/mL INSIGNIFICANT GROWTH Performed at [facility], [address], [place], [place] [address]    Report Status 08/27/2016 FINAL  Final  Aerobic Culture (superficial specimen)     Status: None   Collection Time: 08/26/16  6:15 PM  Result Value Ref Range Status   Specimen Description FOOT LEFT  Final   Special Requests NONE  Final   Gram Stain   Final    FEW WBC PRESENT, PREDOMINANTLY PMN RARE GRAM POSITIVE COCCI IN PAIRS Performed at [facility], [place] [address], [place], [place] [address]    Culture   Final    RARE METHICILLIN RESISTANT STAPHYLOCOCCUS AUREUS MULTIPLE ORGANISMS PRESENT, NONE PREDOMINANT    Report Status 08/30/2016 FINAL  Final   Organism ID, Bacteria METHICILLIN RESISTANT STAPHYLOCOCCUS AUREUS  Final      Susceptibility   Methicillin resistant staphylococcus aureus - MIC*    CIPROFLOXACIN >=8 RESISTANT Resistant      ERYTHROMYCIN >=8 RESISTANT Resistant     GENTAMICIN <=0.5 SENSITIVE Sensitive     OXACILLIN >=4 RESISTANT Resistant     TETRACYCLINE <=1 SENSITIVE Sensitive     VANCOMYCIN 1 SENSITIVE Sensitive     TRIMETH/SULFA <=10 SENSITIVE Sensitive     CLINDAMYCIN <=0.25 SENSITIVE Sensitive     RIFAMPIN <=0.5 SENSITIVE Sensitive     Inducible Clindamycin NEGATIVE Sensitive     * RARE METHICILLIN RESISTANT STAPHYLOCOCCUS AUREUS  Aerobic/Anaerobic Culture (surgical/deep wound)     Status: None (Preliminary result)   Collection Time: 08/28/16  1:03  PM  Result Value Ref Range Status   Specimen Description TOE  Final   Special Requests SECOND TOE LEFT FOOT BONE CULTURE  Final   Gram Stain   Final    MODERATE WBC PRESENT,BOTH PMN AND MONONUCLEAR NO ORGANISMS SEEN    Culture   Final    NO GROWTH 3 DAYS Performed at [facility], [address], [place], [place] [address]    Report Status PENDING  Incomplete    Studies/Results: No results found.  Assessment/Plan: Mary Mcdonald is a 51 y.o. female with cellulitis of L foot as well as osteomyelitis of 2nd toe and possibly 1st toe Cultures with MRSA. S/Mcdonald surgery 4/10 with removal of 2nd toe.  ESR only 21, CRP <0.8. HIV negative She has well controlled DM (A1c 6.7) but seems to have PN as I can examine the infected toe without pain.  I am at this point somewhat underwhelmed given that the inflammatory markers are so low and there does not seem to be much residual infected bone remaining after surgery   Recommendations  Will rec continue IV abx while inpatient but at DC can send on oral augmentin 875 bid and doxycycline 100 bid for 3 weeks from surgery time- stop date 5/1 She would be a poor picc candidate given her psych illness and I think we can avoid IV abx at dc  I can see in 2 weeks after dc- can try to coordinate follow up so I can see her the same day as Podiatry. Thank you very much for the consult. Will follow with you.  [place],  Mary Mcdonald   08/31/2016, 2:46 PM

## 2016-08-31 NOTE — Progress Notes (Signed)
Clinical Social Worker (CSW) received consult for medication needs. RN case manager aware of above. Please reconsult if future social work needs arise. CSW signing off.   Westlee Devita, LCSW [phone] 

## 2016-08-31 NOTE — Consult Note (Signed)
[facility] Face-to-Face Psychiatry Consult   Reason for Consult:  Consult for 51 year old woman who presented to the emergency room last night allegedly for pain in her foot but who was clearly agitated psychotic and showing manic symptoms. Referring Physician:  Leslye Peer Patient Identification: Mary Mcdonald MRN:  [id number] Principal Diagnosis: Bipolar I disorder, most recent episode (or current) manic ([place]) Diagnosis:   Patient Active Problem List   Diagnosis Date Noted  . Bipolar I disorder, most recent episode (or current) manic ([place]) [F31.10] 08/26/2016  . Cellulitis [L03.90] 08/25/2016    Total Time spent with patient: 15 minutes  Subjective:   Mary Mcdonald is a 51 y.o. female patient admitted with "I just came from [place]".  Follow-up for Friday the 13th. Patient is medically cleared. She is ready to switch over to all oral medicine for antibiotics. She is cleared to ambulate in the orthopedic dressings she is wearing. Not aggressive or violent. Patient still disorganized in her thinking. Reviewed situation with TTS and with nursing on the floor. I was given a telephone number for the patient's sister and attempted to call and was only able to reach voicemail.  HPI:  Patient interviewed. Chart reviewed. 51 year old woman presented to the emergency room last night. She was reportedly agitated disorganized and bizarre in her behavior even before getting it made it to the ER. Law enforcement reported that she had been urinating in public. Patient was complaining of pain in her left foot but then was only partially cooperative with evaluation of it. She was noted to be disorganized angry agitated and possibly threatening and had to be given forced medicines in the emergency room. On interview today the patient says that she just came here from [place] on Friday. Apparently she was planning to stay with someone in [facility] but she says that they threw her out and told her to go to  the shelter. Her story is rambling and disorganized and hard to make complete sense of. Patient indicates that she is not currently taking any psychiatric medicine. She is evasive about most specific psychiatric questions. Claims to not be having any particular problems with sleep or appetite. Denies suicidal or homicidal ideation. She says that she used to have hallucinations years ago but no longer does. She says the only psychiatric medicine she takes currently as clonazepam because it helps her to sleep. Denies that she is drinking or using any drugs although drug screen is positive for marijuana.  Social history: From what I can tell she is primarily located in [place]. She named several hospitals back there. At the same time she says that she was planning to relocate here to [facility]. Unclear exactly who she was planning to stay with.  Medical history: Has cellulitis. I'm unclear about the rest of her medical history. When I ask her about other illnesses she said yes to everything I ask. Couldn't name any specific medicines that she takes.  Substance abuse history: Denies use of alcohol says that she doesn't use any drugs denies any history of substance abuse treatment. Drug screen positive for cannabis  Past Psychiatric History: Evasive about this and we don't have much in the way of any old records but she does indicate she's had bipolar disorder that has been diagnosed "all my life". She says she's had several hospitalizations in the past. Denies any history of suicide attempts. Admits to a history of violence. She says for years she took Depakote and Seroquel but doesn't do  that anymore. She to lithium.  Risk to Self: Is patient at risk for suicide?: No (IVC at this time with 1:1 sitter) Risk to Others:   Prior Inpatient Therapy:   Prior Outpatient Therapy:    Past Medical History:  Past Medical History:  Diagnosis Date  . Bipolar 1 disorder [facility])     Past Surgical  History:  Procedure Laterality Date  . 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 No pertinent family history. Family Psychiatric  History: Does not know of any Social History:  History  Alcohol use Not on file     History  Drug use: Unknown    Social History   Social History  . Marital status: Widowed    Spouse name: N/A  . Number of children: N/A  . Years of education: N/A   Social History Main Topics  . Smoking status: Current Every Day Smoker  . Smokeless tobacco: Never Used  . Alcohol use None  . Drug use: Unknown  . Sexual activity: Not Asked   Other Topics Concern  . None   Social History Narrative  . None   Additional Social History:    Allergies:   Allergies  Allergen Reactions  . Lithium Rash    Labs:  Results for orders placed or performed during the hospital encounter of 08/25/16 (from the past 48 hour(s))  Glucose, capillary     Status: Abnormal   Collection Time: 08/29/16  4:23 PM  Result Value Ref Range   Glucose-Capillary 214 (H) 65 - 99 mg/dL  Glucose, capillary     Status: Abnormal   Collection Time: 08/29/16  8:37 PM  Result Value Ref Range   Glucose-Capillary 171 (H) 65 - 99 mg/dL  Vancomycin, trough     Status: Abnormal   Collection Time: 08/30/16  1:33 AM  Result Value Ref Range   Vancomycin Tr 23 (HH) 15 - 20 ug/mL    Comment: CRITICAL RESULT CALLED TO, READ BACK BY AND VERIFIED WITH DAVID BESANTI ON 08/30/16 AT 0213 BY TLB   Basic metabolic panel     Status: Abnormal   Collection Time: 08/30/16  5:08 AM  Result Value Ref Range   Sodium 139 135 - 145 mmol/L   Potassium 3.3 (L) 3.5 - 5.1 mmol/L   Chloride 109 101 - 111 mmol/L   CO2 26 22 - 32 mmol/L   Glucose, Bld 231 (H) 65 - 99 mg/dL   BUN 15 6 - 20 mg/dL   Creatinine, Ser 0.76 0.44 - 1.00 mg/dL   Calcium 9.0 8.9 - 10.3 mg/dL    GFR calc non Af Amer >60 >60 mL/min   GFR calc Af Amer >60 >60 mL/min    Comment: (NOTE) The eGFR has been calculated using the CKD EPI equation. This calculation has not been validated in all clinical situations. eGFR's persistently <60 mL/min signify possible Chronic Kidney Disease.    Anion gap 4 (L) 5 - 15  Glucose, capillary     Status: Abnormal   Collection Time: 08/30/16  7:59 AM  Result Value Ref Range   Glucose-Capillary 159 (H) 65 - 99 mg/dL  Vancomycin, trough     Status: Abnormal   Collection Time: 08/30/16  8:58 AM  Result Value Ref Range   Vancomycin Tr 26 (HH) 15 - 20 ug/mL    Comment: CRITICAL RESULT CALLED TO,  READ BACK BY AND VERIFIED WITH  CHRISTINE KATSOUDAS AT 6314 08/30/16 SDR   Glucose, capillary     Status: Abnormal   Collection Time: 08/30/16 11:40 AM  Result Value Ref Range   Glucose-Capillary 214 (H) 65 - 99 mg/dL  Vancomycin, trough     Status: None   Collection Time: 08/30/16  3:18 PM  Result Value Ref Range   Vancomycin Tr 15 15 - 20 ug/mL  Glucose, capillary     Status: Abnormal   Collection Time: 08/30/16  4:24 PM  Result Value Ref Range   Glucose-Capillary 130 (H) 65 - 99 mg/dL  Glucose, capillary     Status: Abnormal   Collection Time: 08/30/16  9:35 PM  Result Value Ref Range   Glucose-Capillary 238 (H) 65 - 99 mg/dL   Comment 1 Notify RN   Glucose, capillary     Status: Abnormal   Collection Time: 08/31/16  7:55 AM  Result Value Ref Range   Glucose-Capillary 104 (H) 65 - 99 mg/dL  Glucose, capillary     Status: Abnormal   Collection Time: 08/31/16 11:30 AM  Result Value Ref Range   Glucose-Capillary 222 (H) 65 - 99 mg/dL    Current Facility-Administered Medications  Medication Dose Route Frequency Provider Last Rate Last Dose  . 0.9 %  sodium chloride infusion   Intravenous Continuous Andria Frames, MD 50 mL/hr at 08/30/16 2158    . amLODipine (NORVASC) tablet 10 mg  10 mg Oral Daily Harrie Foreman, MD   10 mg at 08/31/16  0844  . clonazePAM (KLONOPIN) tablet 1 mg  1 mg Oral QHS Gonzella Lex, MD   1 mg at 08/30/16 2200  . enoxaparin (LOVENOX) injection 40 mg  40 mg Subcutaneous Q24H Debby Crosley, MD   40 mg at 08/30/16 2200  . HYDROcodone-acetaminophen (NORCO/VICODIN) 5-325 MG per tablet 1 tablet  1 tablet Oral Q4H PRN Lance Coon, MD   1 tablet at 08/30/16 0204  . HYDROcodone-acetaminophen (NORCO/VICODIN) 5-325 MG per tablet 1-2 tablet  1-2 tablet Oral Q4H PRN Sharlotte Alamo, DPM   2 tablet at 08/31/16 0843  . insulin aspart (novoLOG) injection 0-5 Units  0-5 Units Subcutaneous QHS Loletha Grayer, MD   2 Units at 08/30/16 2200  . insulin aspart (novoLOG) injection 0-9 Units  0-9 Units Subcutaneous TID WC Loletha Grayer, MD   3 Units at 08/31/16 1225  . levothyroxine (SYNTHROID, LEVOTHROID) tablet 100 mcg  100 mcg Oral Q0600 Gonzella Lex, MD   100 mcg at 08/31/16 0500  . lisinopril (PRINIVIL,ZESTRIL) tablet 5 mg  5 mg Oral Daily Loletha Grayer, MD   5 mg at 08/31/16 0844  . LORazepam (ATIVAN) injection 2 mg  2 mg Intravenous Q6H PRN Debby Crosley, MD      . nicotine (NICODERM CQ - dosed in mg/24 hours) patch 21 mg  21 mg Transdermal Daily Henreitta Leber, MD   21 mg at 08/31/16 0844  . piperacillin-tazobactam (ZOSYN) IVPB 3.375 g  3.375 g Intravenous Q8H Merilyn Baba, RPH   3.375 g at 08/31/16 0500  . QUEtiapine (SEROQUEL) tablet 300 mg  300 mg Oral QHS Gonzella Lex, MD   300 mg at 08/30/16 2302  . vancomycin (VANCOCIN) IVPB 750 mg/150 ml premix  750 mg Intravenous Q8H Sheema M Hallaji, RPH   750 mg at 08/31/16 1057    Musculoskeletal: Strength & Muscle Tone: within normal limits Gait & Station: normal Patient leans: N/A  Psychiatric Specialty Exam:  Physical Exam  Nursing note and vitals reviewed. Constitutional: She appears well-developed and well-nourished.  HENT:  Head: Normocephalic and atraumatic.  Eyes: Conjunctivae are normal. Pupils are equal, round, and reactive to light.  Neck: Normal  range of motion.  Cardiovascular: Regular rhythm and normal heart sounds.   Respiratory: Effort normal. No respiratory distress.  GI: Soft.  Musculoskeletal: Normal range of motion.       Feet:  Neurological: She is alert.  Skin: Skin is warm and dry.  Psychiatric: Her affect is labile and inappropriate. Her affect is not angry. Her speech is tangential. She is not agitated and not hyperactive. Thought content is paranoid. She expresses impulsivity. She expresses no homicidal and no suicidal ideation. She exhibits abnormal recent memory. She is inattentive.    Review of Systems  Constitutional: Negative.   HENT: Negative.   Eyes: Negative.   Respiratory: Negative.   Cardiovascular: Negative.   Gastrointestinal: Negative.   Musculoskeletal: Negative.   Skin: Negative.   Neurological: Negative.   Psychiatric/Behavioral: Positive for memory loss. Negative for depression, hallucinations, substance abuse and suicidal ideas. The patient is not nervous/anxious and does not have insomnia.     Blood pressure (!) 160/86, pulse 87, temperature 99 F (37.2 C), temperature source Oral, resp. rate 18, height 5' 9"  (1.753 m), weight 68.2 kg (150 lb 4.8 oz), SpO2 100 %.Body mass index is 22.2 kg/m.  General Appearance: Casual  Eye Contact:  Fair  Speech:  Garbled and Pressured  Volume:  Increased  Mood:  Euphoric and Irritable  Affect:  Inappropriate and Labile  Thought Process:  Disorganized  Orientation:  Other:  She knew she was in a hospital and could tell me the name of it. She told me the year was 1967 but am not sure she really understood the question.  Thought Content:  Rumination  Suicidal Thoughts:  No  Homicidal Thoughts:  No  Memory:  Immediate;   Fair Recent;   Poor Remote;   Fair  Judgement:  Impaired  Insight:  Shallow  Psychomotor Activity:  Increased and Restlessness  Concentration:  Concentration: Poor  Recall:  Poor  Fund of Knowledge:  Poor  Language:  Fair    Akathisia:  No  Handed:  Right  AIMS (if indicated):     Assets:  Resilience  ADL's:  Intact  Cognition:  Impaired,  Mild  Sleep:        Treatment Plan Summary: Daily contact with patient to assess and evaluate symptoms and progress in treatment, Medication management and Plan Patient is cleared for transfer to the psychiatric unit and it. Her commitment is still in place and valid although I can renew it today if needed. Spoke with TTS and with the nursing supervisor downstairs. Hopefully we will be able to organize transfer as soon as possible.  Disposition: Recommend psychiatric Inpatient admission when medically cleared. Supportive therapy provided about ongoing stressors.  Alethia Berthold, MD 08/31/2016 1:03 PM

## 2016-08-31 NOTE — Consult Note (Signed)
Psychiatry: I have renewed the involuntary commitment papers. They are being process through the emergency room.

## 2016-08-31 NOTE — Progress Notes (Signed)
3 Days Post-Op  Subjective: Patient seen. No complaints of pain.  Objective: Vital signs in last 24 hours: Temp:  [98.1 F (36.7 C)-99 F (37.2 C)] 99 F (37.2 C) (04/13 0841) Pulse Rate:  [74-87] 87 (04/13 0841) Resp:  [18-20] 18 (04/13 0841) BP: (122-160)/(53-86) 160/86 (04/13 0841) SpO2:  [98 %-100 %] 100 % (04/13 0841) Last BM Date: 08/29/16  Intake/Output from previous day: 04/12 0701 - 04/13 0700 In: 3559.2 [P.O.:720; I.V.:2439.2; IV Piggyback:400] Out: -  Intake/Output this shift: Total I/O In: 480 [P.O.:480] Out: -   Bandages dry and intact on the left foot. Upon removal the incision at the amputation site of the second toe is well coapted with no drainage. There is still a full-thickness small wound on the plantar aspect of the left hallux. Some serous type drainage is expressed. A culture was taken for sensitivities today. Mild drainage was noted on the packing that was removed from the hallux.  Lab Results:  No results for input(s): WBC, HGB, HCT, PLT in the last 72 hours. BMET  Recent Labs  08/30/16 0508  NA 139  K 3.3*  CL 109  CO2 26  GLUCOSE 231*  BUN 15  CREATININE 0.76  CALCIUM 9.0   PT/INR No results for input(s): LABPROT, INR in the last 72 hours. ABG No results for input(s): PHART, HCO3 in the last 72 hours.  Invalid input(s): PCO2, PO2  Studies/Results: No results found.  Anti-infectives: Anti-infectives    Start     Dose/Rate Route Frequency Ordered Stop   08/30/16 1800  vancomycin (VANCOCIN) IVPB 750 mg/150 ml premix     750 mg 150 mL/hr over 60 Minutes Intravenous Every 8 hours 08/30/16 1615     08/30/16 1600  vancomycin (VANCOCIN) 1,250 mg in sodium chloride 0.9 % 250 mL IVPB  Status:  Discontinued     1,250 mg 166.7 mL/hr over 90 Minutes Intravenous Every 8 hours 08/30/16 1555 08/30/16 1603   08/30/16 0000  amoxicillin-clavulanate (AUGMENTIN) 875-125 MG tablet     1 tablet Oral 2 times daily 08/30/16 1044     08/30/16 0000   doxycycline (VIBRAMYCIN) 50 MG capsule     100 mg Oral 2 times daily 08/30/16 1044     08/28/16 1230  piperacillin-tazobactam (ZOSYN) IVPB 3.375 g     3.375 g 100 mL/hr over 30 Minutes Intravenous  Once 08/28/16 1231 08/28/16 1302   08/27/16 0800  vancomycin (VANCOCIN) 1,250 mg in sodium chloride 0.9 % 250 mL IVPB  Status:  Discontinued     1,250 mg 166.7 mL/hr over 90 Minutes Intravenous Every 8 hours 08/27/16 0751 08/30/16 1000   08/26/16 0100  piperacillin-tazobactam (ZOSYN) IVPB 3.375 g     3.375 g 12.5 mL/hr over 240 Minutes Intravenous Every 8 hours 08/25/16 1652     08/25/16 2300  vancomycin (VANCOCIN) IVPB 1000 mg/200 mL premix  Status:  Discontinued     1,000 mg 200 mL/hr over 60 Minutes Intravenous Every 8 hours 08/25/16 2129 08/27/16 0751   08/25/16 2200  vancomycin (VANCOCIN) IVPB 1000 mg/200 mL premix  Status:  Discontinued     1,000 mg 200 mL/hr over 60 Minutes Intravenous Every 12 hours 08/25/16 1652 08/25/16 2056   08/25/16 1545  piperacillin-tazobactam (ZOSYN) IVPB 3.375 g     3.375 g 100 mL/hr over 30 Minutes Intravenous  Once 08/25/16 1530 08/25/16 1710   08/25/16 1545  vancomycin (VANCOCIN) IVPB 1000 mg/200 mL premix     1,000 mg 200 mL/hr over 60  Minutes Intravenous  Once 08/25/16 1530 08/25/16 1818      Assessment/Plan: s/p Procedure(s): AMPUTATION DIGIT 2nd toe (Left) Assessment: 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 Continued full-thickness ulceration on the hallux   Plan: A culture was taken of the fluid from the ulceration on the great toe. A sterile saline gauze wet-to-dry packing was placed back into the wound. Betadine applied to the incision site. Sterile dressing applied. Overall everything looks stable and we will keep her bandage on over the weekend and then reassess next week.  LOS: 6 days    Ricci Barker 08/31/2016

## 2016-08-31 NOTE — Progress Notes (Addendum)
ANTIBIOTIC CONSULT NOTE - INITIAL  Pharmacy Consult for Vancomycin, Zosyn  Indication: cellulitis, possible osteomyelitis      Allergies  Allergen Reactions  . Lithium Rash    Patient Measurements: Height:  (175.3 cm) Weight: 150 lb 4.8 oz (68.2 kg) IBW/kg (Calculated) : 66.2 Adjusted Body Weight: 67 kg   Vital Signs: Temp: 98.8 F (37.1 C) (04/08 2354) Temp Source: Oral (04/08 2354) BP: 148/72 (04/08 2354) Pulse Rate: 78 (04/08 2354) Intake/Output from previous day: 04/08 0701 - 04/09 0700 In: 840 [P.O.:840] Out: -  Intake/Output from this shift: No intake/output data recorded.  Labs:  Recent Labs (last 2 labs)    Recent Labs  08/25/16 1436 08/26/16 0457  WBC 11.4* 7.3  HGB 11.4* 11.2*  PLT 371 328  CREATININE 0.52 0.44     Estimated Creatinine Clearance: 87.9 mL/min (by C-G formula based on SCr of 0.44 mg/dL).  Recent Labs (last 2 labs)    Recent Labs  08/27/16 0631  VANCOTROUGH 13*      Microbiology:       Recent Results (from the past 720 hour(s))  Blood Culture (routine x 2) Status: None (Preliminary result)   Collection Time: 08/25/16 4:04 PM  Result Value Ref Range Status   Specimen Description BLOOD L AC  Final   Special Requests BOTTLES DRAWN AEROBIC AND ANAEROBIC BCAV  Final   Culture NO GROWTH < 24 HOURS  Final   Report Status PENDING  Incomplete  Blood Culture (routine x 2) Status: None (Preliminary result)   Collection Time: 08/25/16 4:04 PM  Result Value Ref Range Status   Specimen Description BLOOD R HAND  Final   Special Requests BOTTLES DRAWN AEROBIC AND ANAEROBIC BCAV  Final   Culture NO GROWTH < 24 HOURS  Final   Report Status PENDING  Incomplete    Medical History:     Past Medical History:  Diagnosis Date  . Bipolar 1 disorder ([facility])     Medications:  No prescriptions prior to admission.   Assessment: CrCl = 110 ml/min Ke = 0.096 hr-1 T1/2 =  7.2 hrs Vd = 47.7 L   Goal of Therapy:  Vancomycin trough level 15-20 mcg/ml  Plan: Expected duration 7 days with resolution of temperature and/or normalization of WBC  Zosyn 3.375 gm IV X 1 given on 4/7 @ 16:40. Zosyn 3.375 gm IV Q8H EI ordered to start on 4/8 @ 22:00.  Vancomycin 1 gm IV X 1 given on 4/7 @ 17:00.  Vancomycin 1 mg IV Q8H ordered to start on 4/8 @ 0100, 6 hrs after 1st dose (stacked dosing). This pt will reach Css by 4/9 @ 0500. Will draw 1st trough on 4/9 @ 0630, which will be at Css.   Vancomycin trough 13 mcg/mL before sixth dose. MRI today for OM eval - will increase dose to 1.25 gm IV Q8H and recheck trough tomorrow morning. If MRI shows cellulitis we can likely stay at 1 gm IV Q8H but until OM r/o will increase to 1.25 gm IV Q8H.   4/10 @ 0500 VT 24 was drawn 5 hours too early. Will redraw VT today @ 1500 to reassess.  4/10 @ 1726 16. Will continue with current dose of Vancomycin  IV every 8 hours. Will redraw VT after 5 doses to monitor.   4/12 @ 0130 VT 23. Level was drawn probably as the dose was infusing, will redraw another trough @ 0900 and reassess. No BMP in 4 days, will recheck BMP w/  am labs to assess renal function, UOP not documented.  4/12: VT @ ~08:58 was 26, likely drawn while drug infusing. Will recheck trough level .  4/12 Vanc level 15 . According to nurse patient never received Am dose even though MAR record show trough was drawn after start of AM vanc dose.  Based on two levels drawn today, new Ke: 0.092  and t1/2 ~8 hours. Will adjust dose to Vancomycin  IV every 8 hours. Will recheck trough level 4/13 prior to 4th dose.  4/13 VT 17 . Will continue current regimen of  IV every 8 hours. Patient scheduled for D/C on oral Doxy and Augmentin. Will F/U renal function in two days if not D/Cd  Gardner Candle, PharmD, BCPS Clinical Pharmacist 08/31/2016 6:09 PM

## 2016-09-01 LAB — GLUCOSE, CAPILLARY
GLUCOSE-CAPILLARY: 132 mg/dL — AB (ref 65–99)
Glucose-Capillary: 230 mg/dL — ABNORMAL HIGH (ref 65–99)
Glucose-Capillary: 175 mg/dL — ABNORMAL HIGH (ref 65–99)
Glucose-Capillary: 200 mg/dL — ABNORMAL HIGH (ref 65–99)

## 2016-09-01 LAB — CREATININE, SERUM
CREATININE: 0.71 mg/dL (ref 0.44–1.00)
GFR calc non Af Amer: >60 mL/min (ref >60)

## 2016-09-01 MED ORDER — DIVALPROEX SODIUM 500 MG PO DR TAB
500.0000 mg | DELAYED_RELEASE_TABLET | Freq: Two times a day (BID) | ORAL | Status: DC
  Administered 2016-09-01 – 2016-09-05 (×8): 500 mg via ORAL
  Filled 2016-09-01 (×8): qty 1

## 2016-09-01 NOTE — Progress Notes (Signed)
Patient ID: Mary Mcdonald, female   DOB: 24-Jun-1965, 51 y.o.   MRN: [id number]  [employer] PROGRESS NOTE  Mary Mcdonald WUJ:[id number] DOB: May 04, 1966 DOA: 08/25/2016 PCP: No PCP Per Patient  HPI/Subjective: wound culture showed MRSA from  Left foot, so she did not go to psych unit.  Objective: Vitals:   09/01/16 0525 09/01/16 0757  BP: (!) 155/67 (!) 150/77  Pulse: 73 66  Resp: 18 18  Temp: 98.5 F (36.9 C) 98.2 F (36.8 C)    Filed Weights   08/25/16 1417 08/25/16 2053  Weight: 72.6 kg (160 lb) 68.2 kg (150 lb 4.8 oz)    ROS: Review of Systems  Constitutional: Negative for chills and fever.  Eyes: Negative for blurred vision.  Respiratory: Negative for cough and shortness of breath.   Cardiovascular: Negative for chest pain.  Gastrointestinal: Negative for abdominal pain, constipation, diarrhea, nausea and vomiting.  Genitourinary: Negative for dysuria.  Musculoskeletal: Positive for joint pain.  Neurological: Negative for dizziness and headaches.    Exam: Physical Exam  HENT:  Nose: No mucosal edema.  Mouth/Throat: No oropharyngeal exudate.  Eyes: Conjunctivae, EOM and lids are normal. Pupils are equal, round, and reactive to light.  Neck: Carotid bruit is not present. No thyromegaly present.  Cardiovascular: Regular rhythm, S1 normal, S2 normal and normal heart sounds.   Respiratory: She has no decreased breath sounds. She has no wheezes. She has no rhonchi. She has no rales.  GI: Soft. Bowel sounds are normal. There is no tenderness.  Musculoskeletal:       Right ankle: She exhibits no swelling.       Left ankle: She exhibits decreased range of motion and swelling.  Neurological: She is alert. No cranial nerve deficit.  Skin: Skin is warm. Nails show no clubbing.  Both feet covered with dressings by podiatry  Psychiatric: She has a normal mood and affect. Her speech is not rapid and/or pressured.      Data Reviewed: Basic Metabolic Panel:  Recent  Labs Lab 08/25/16 1436 08/26/16 0457 08/30/16 0508 09/01/16 0431  NA 140 140 139  --   K 3.3* 3.4* 3.3*  --   CL 106 107 109  --   CO2 --   GLUCOSE 155* 121* 231*  --   BUN --   CREATININE 0.52 0.44 0.76 0.71  CALCIUM 9.3 8.9 9.0  --    Liver Function Tests:  Recent Labs Lab 08/25/16 1436  AST 26  ALT 33  ALKPHOS 91  BILITOT 0.8  PROT 7.4  ALBUMIN 3.8   CBC:  Recent Labs Lab 08/25/16 1436 08/26/16 0457  WBC 11.4* 7.3  HGB 11.4* 11.2*  HCT 35.4 34.4*  MCV 82.2 82.0  PLT 371 328     Recent Results (from the past 240 hour(s))  Blood Culture (routine x 2)     Status: None   Collection Time: 08/25/16  4:04 PM  Result Value Ref Range Status   Specimen Description BLOOD  L AC  Final   Special Requests BOTTLES DRAWN AEROBIC AND ANAEROBIC  BCAV  Final   Culture NO GROWTH 5 DAYS  Final   Report Status 08/30/2016 FINAL  Final  Blood Culture (routine x 2)     Status: None   Collection Time: 08/25/16  4:04 PM  Result Value Ref Range Status   Specimen Description BLOOD R HAND  Final   Special Requests BOTTLES DRAWN AEROBIC AND ANAEROBIC  BCAV  Final   Culture NO GROWTH 5 DAYS  Final   Report Status 08/30/2016 FINAL  Final  Urine culture     Status: Abnormal   Collection Time: 08/26/16 12:30 AM  Result Value Ref Range Status   Specimen Description URINE, RANDOM  Final   Special Requests NONE  Final   Culture (A)  Final    <10,000 COLONIES/mL INSIGNIFICANT GROWTH Performed at [facility], [address] [place], [place] [address]    Report Status 08/27/2016 FINAL  Final  Aerobic Culture (superficial specimen)     Status: None   Collection Time: 08/26/16  6:15 PM  Result Value Ref Range Status   Specimen Description FOOT LEFT  Final   Special Requests NONE  Final   Gram Stain   Final    FEW WBC PRESENT, PREDOMINANTLY PMN RARE GRAM POSITIVE COCCI IN PAIRS Performed at [facility], [address], [place], [place] [address]     Culture   Final    RARE METHICILLIN RESISTANT STAPHYLOCOCCUS AUREUS MULTIPLE ORGANISMS PRESENT, NONE PREDOMINANT    Report Status 08/30/2016 FINAL  Final   Organism ID, Bacteria METHICILLIN RESISTANT STAPHYLOCOCCUS AUREUS  Final      Susceptibility   Methicillin resistant staphylococcus aureus - MIC*    CIPROFLOXACIN >=8 RESISTANT Resistant     ERYTHROMYCIN >=8 RESISTANT Resistant     GENTAMICIN <=0.5 SENSITIVE Sensitive     OXACILLIN >=4 RESISTANT Resistant     TETRACYCLINE <=1 SENSITIVE Sensitive     VANCOMYCIN 1 SENSITIVE Sensitive     TRIMETH/SULFA <=10 SENSITIVE Sensitive     CLINDAMYCIN <=0.25 SENSITIVE Sensitive     RIFAMPIN <=0.5 SENSITIVE Sensitive     Inducible Clindamycin NEGATIVE Sensitive     * RARE METHICILLIN RESISTANT STAPHYLOCOCCUS AUREUS  Aerobic/Anaerobic Culture (surgical/deep wound)     Status: None (Preliminary result)   Collection Time: 08/28/16  1:03 PM  Result Value Ref Range Status   Specimen Description TOE  Final   Special Requests SECOND TOE LEFT FOOT BONE CULTURE  Final   Gram Stain   Final    MODERATE WBC PRESENT,BOTH PMN AND MONONUCLEAR NO ORGANISMS SEEN    Culture   Final    NO GROWTH 3 DAYS Performed at [facility], [address], [place], [place] [address]    Report Status PENDING  Incomplete  Aerobic Culture (superficial specimen)     Status: None (Preliminary result)   Collection Time: 08/31/16 12:22 PM  Result Value Ref Range Status   Specimen Description ULCER TOE left great  Final   Special Requests NONE  Final   Gram Stain   Final    FEW WBC PRESENT, PREDOMINANTLY PMN NO ORGANISMS SEEN    Culture   Final    NO GROWTH 1 DAY Performed at [facility], [address], [place], [place] [address]    Report Status PENDING  Incomplete     Studies: No results found.  Scheduled Meds: . amLODipine  10 mg Oral Daily  . clonazePAM  1 mg Oral QHS  . enoxaparin (LOVENOX) injection  40 mg Subcutaneous Q24H  . insulin  aspart  0-5 Units Subcutaneous QHS  . insulin aspart  0-9 Units Subcutaneous TID WC  . levothyroxine  100 mcg Oral Q0600  . lisinopril  5 mg Oral Daily  . nicotine  21 mg Transdermal Daily  . piperacillin-tazobactam (ZOSYN)  IV  3.375 g Intravenous Q8H  . QUEtiapine  300  mg Oral QHS  . vancomycin  750 mg Intravenous Q8H    Assessment/Plan:  1. 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 MRSA:from wound cultures ; continue antibiotics with vancomycin, continue isolation, can not be  transferred to [facility] because of MRSA and isolation.  3.  4. Type 2 diabetes mellitus. Good control hemoglobin A1c 6.7. On sliding scale.  5.  6. Essential hypertension on low-dose lisinopril, amlodipine. 7.  8. Hypothyroidism unspecified on levothyroxine 9.  Bipolar disorder; continue  Klonopin, Seroquel, psych is following, continue IVC.  Code Status:     Code Status Orders        Start     Ordered   08/25/16 2057  Full code  Continuous     08/25/16 2056    Code Status History    Date Active Date Inactive Code Status Order ID Comments User Context   This patient has a current code status but no historical code status.      Disposition Plan;  Consultants:  Podiatry  Psychiatry  Antibiotics:  Vancomycin  Zosyn  Time spent: 22 minutes  [employer]

## 2016-09-01 NOTE — Progress Notes (Signed)
[facility] MD Progress Note  09/01/2016 5:15 PM Mary Mcdonald  MRN:  [id number]  Subjective:     Subjective:   Mary Mcdonald is a 51 y.o. female patient admitted with "I just came from [place]". She does appear to be manic and has a diagnosis of Bipolar Disorder:  The patient remains mildly manic although appears to be calmer compared to prior records. She does have pressured speech and is hyperverbal. The patient is mildly agitated as well and was argumentative with this Probation officer. She was not wanting to answer a lot of questions from this writer and was not cooperative with the interview. She however was not violent or aggressive. She has remained with a one-to-one sitter. She is denying any auditory or visual hallucinations. She says that she is not suicidal although she did not answer the question directly. She says that the only medication she wanted was Klonopin. She denies any problems with her appetite. She denies any problems with insomnia.  Social history: From what I can tell she is primarily located in [place]. She named several hospitals back there. At the same time she says that she was planning to relocate here to [facility]. Unclear exactly who she was planning to stay with.  Medical history: Has cellulitis. I'm unclear about the rest of her medical history. When I ask her about other illnesses she said yes to everything I ask. Couldn't name any specific medicines that she takes.  Substance abuse history: Denies use of alcohol says that she doesn't use any drugs denies any history of substance abuse treatment. Drug screen positive for cannabis  Past Psychiatric History: Evasive about this and we don't have much in the way of any old records but she does indicate she's had bipolar disorder that has been diagnosed "all my life". She says she's had several hospitalizations in the past. Denies any history of suicide attempts. Admits to a history of violence. She says for  years she took Depakote and Seroquel but doesn't do that anymore. She to lithium.  Principal Problem: Bipolar I disorder, most recent episode (or current) manic ([place]) Diagnosis:   Patient Active Problem List   Diagnosis Date Noted  . Bipolar I disorder, most recent episode (or current) manic ([place]) [F31.10] 08/26/2016  . Cellulitis [L03.90] 08/25/2016   Total Time spent with patient: 20 minutes    Past Medical History:  Past Medical History:  Diagnosis Date  . Bipolar 1 disorder [facility])     Past Surgical History:  Procedure Laterality Date  . 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 No pertinent family history.  Social History:  History  Alcohol use Not on file     History  Drug use: Unknown    Social History   Social History  . Marital status: Widowed    Spouse name: N/A  . Number of children: N/A  . Years of education: N/A   Social History Main Topics  . Smoking status: Current Every Day Smoker  . Smokeless tobacco: Never Used  . Alcohol use None  . Drug use: Unknown  . Sexual activity: Not Asked   Other Topics Concern  . None   Social History Narrative  . None   Additional Social History:  Sleep: No good report  Appetite:  Good  Current Medications: Current Facility-Administered Medications  Medication Dose Route Frequency Provider Last Rate Last Dose  . amLODipine (NORVASC) tablet 10 mg  10 mg Oral Daily Harrie Foreman, MD   10 mg at 09/01/16 1044  . clonazePAM (KLONOPIN) tablet 1 mg  1 mg Oral QHS Gonzella Lex, MD   1 mg at 08/31/16 2155  . divalproex (DEPAKOTE) DR tablet 500 mg  500 mg Oral BID Chauncey Mann, MD      . enoxaparin (LOVENOX) injection 40 mg  40 mg Subcutaneous Q24H Debby Crosley, MD   40 mg at 08/31/16 2156  . HYDROcodone-acetaminophen  (NORCO/VICODIN) 5-325 MG per tablet 1 tablet  1 tablet Oral Q4H PRN Lance Coon, MD   1 tablet at 08/30/16 0204  . HYDROcodone-acetaminophen (NORCO/VICODIN) 5-325 MG per tablet 1-2 tablet  1-2 tablet Oral Q4H PRN Sharlotte Alamo, DPM   2 tablet at 09/01/16 1450  . insulin aspart (novoLOG) injection 0-5 Units  0-5 Units Subcutaneous QHS Loletha Grayer, MD   2 Units at 08/30/16 2200  . insulin aspart (novoLOG) injection 0-9 Units  0-9 Units Subcutaneous TID WC Loletha Grayer, MD   3 Units at 09/01/16 1308  . levothyroxine (SYNTHROID, LEVOTHROID) tablet 100 mcg  100 mcg Oral Q0600 Gonzella Lex, MD   100 mcg at 09/01/16 0529  . lisinopril (PRINIVIL,ZESTRIL) tablet 5 mg  5 mg Oral Daily Loletha Grayer, MD   5 mg at 09/01/16 1044  . LORazepam (ATIVAN) injection 2 mg  2 mg Intravenous Q6H PRN Debby Crosley, MD      . nicotine (NICODERM CQ - dosed in mg/24 hours) patch 21 mg  21 mg Transdermal Daily Henreitta Leber, MD   21 mg at 09/01/16 1047  . piperacillin-tazobactam (ZOSYN) IVPB 3.375 g  3.375 g Intravenous Q8H Merilyn Baba, RPH   3.375 g at 09/01/16 1450  . QUEtiapine (SEROQUEL) tablet 300 mg  300 mg Oral QHS Gonzella Lex, MD   300 mg at 08/31/16 2155  . vancomycin (VANCOCIN) IVPB 750 mg/150 ml premix  750 mg Intravenous Q8H Sheema M Hallaji, RPH   750 mg at 09/01/16 1047    Lab Results:  Results for orders placed or performed during the hospital encounter of 08/25/16 (from the past 48 hour(s))  Glucose, capillary     Status: Abnormal   Collection Time: 08/30/16  9:35 PM  Result Value Ref Range   Glucose-Capillary 238 (H) 65 - 99 mg/dL   Comment 1 Notify RN   Glucose, capillary     Status: Abnormal   Collection Time: 08/31/16  7:55 AM  Result Value Ref Range   Glucose-Capillary 104 (H) 65 - 99 mg/dL  Glucose, capillary     Status: Abnormal   Collection Time: 08/31/16 11:30 AM  Result Value Ref Range   Glucose-Capillary 222 (H) 65 - 99 mg/dL  Aerobic Culture (superficial specimen)      Status: None (Preliminary result)   Collection Time: 08/31/16 12:22 PM  Result Value Ref Range   Specimen Description ULCER TOE left great    Special Requests NONE    Gram Stain      FEW WBC PRESENT, PREDOMINANTLY PMN NO ORGANISMS SEEN    Culture      NO GROWTH 1 DAY Performed at [facility], [address] [place], [place] [address]    Report Status PENDING   Glucose, capillary     Status:  Abnormal   Collection Time: 08/31/16  5:05 PM  Result Value Ref Range   Glucose-Capillary 150 (H) 65 - 99 mg/dL  Vancomycin, trough     Status: None   Collection Time: 08/31/16  5:31 PM  Result Value Ref Range   Vancomycin Tr 17 15 - 20 ug/mL  Glucose, capillary     Status: Abnormal   Collection Time: 08/31/16  9:38 PM  Result Value Ref Range   Glucose-Capillary 170 (H) 65 - 99 mg/dL   Comment 1 Notify RN   Creatinine, serum     Status: None   Collection Time: 09/01/16  4:31 AM  Result Value Ref Range   Creatinine, Ser 0.71 0.44 - 1.00 mg/dL   GFR calc non Af Amer >60 >60 mL/min   GFR calc Af Amer >60 >60 mL/min    Comment: (NOTE) The eGFR has been calculated using the CKD EPI equation. This calculation has not been validated in all clinical situations. eGFR's persistently <60 mL/min signify possible Chronic Kidney Disease.   Glucose, capillary     Status: Abnormal   Collection Time: 09/01/16  7:30 AM  Result Value Ref Range   Glucose-Capillary 132 (H) 65 - 99 mg/dL  Glucose, capillary     Status: Abnormal   Collection Time: 09/01/16 11:37 AM  Result Value Ref Range   Glucose-Capillary 230 (H) 65 - 99 mg/dL   Comment 1 Notify RN    Comment 2 Document in Chart   Glucose, capillary     Status: Abnormal   Collection Time: 09/01/16  4:24 PM  Result Value Ref Range   Glucose-Capillary 175 (H) 65 - 99 mg/dL    Blood Alcohol level:  Lab Results  Component Value Date   ETH <5 89/38/1017    Metabolic Disorder Labs: Lab Results  Component Value Date   HGBA1C 6.7 (H)  08/26/2016   MPG 146 08/26/2016   No results found for: PROLACTIN No results found for: CHOL, TRIG, HDL, CHOLHDL, VLDL, LDLCALC   Musculoskeletal: Strength & Muscle Tone: Unable to fully assess Gait & Station: unsteady Patient leans: Right and N/A  Psychiatric Specialty Exam: Physical Exam  Review of Systems  Unable to perform ROS: Psychiatric disorder    Blood pressure (!) 122/92, pulse 86, temperature 97.5 F (36.4 C), temperature source Oral, resp. rate 18, height 5' 9"  (1.753 m), weight 68.2 kg (150 lb 4.8 oz), SpO2 100 %.Body mass index is 22.2 kg/m.  General Appearance: Disheveled  Eye Contact:  Fair  Speech:  Pressured  Volume:  Increased  Mood:  Euphoric  Affect:  Labile  Thought Process:  Disorganized  Orientation:  Other:  She did not answer questions  Thought Content:  Illogical, Delusions and Paranoid Ideation  Suicidal Thoughts:  No  Homicidal Thoughts:  No  Memory:  Immediate;   Poor Recent;   Poor Remote;   Poor  Judgement:  Poor  Insight:  Lacking  Psychomotor Activity:  Increased  Concentration:  Concentration: Poor and Attention Span: Poor  Recall:  Poor  Fund of Knowledge:  Poor  Language:  Good  Akathisia:  No  Handed:  Right  AIMS (if indicated):     Assets:  Others:  Not clear at this time  ADL's:  Impaired  Cognition:  Impaired,  Severe  Sleep:        Treatment Plan Summary: Mary Mcdonald is a 51 year old female with bipolar disorder from [place] who is admitted to the medicine service currently with an infected foot.  She is clearly exhibiting manic symptoms.  Bipolar disorder: Most recent episode manic: The patient was placed on Seroquel 300 mg by mouth nightly by Dr. Carlena Hurl. Depakote 500 mg by mouth twice a day will be added for mood stabilization. Will have to check Depakote level in one week's time as well as liver enzymes. Will continue Klonopin 78m po nightly for now but eventually benzos need to be discontinued. No prolongation of QTC  interval on EKG. We'll need to check lipid panel, prolactin level and hemoglobin A1c.  We'll keep the patient with a one-to-one sitter for now for safety.  The patient will most likely need transfer to the inpatient psychiatry service once medically cleared. She currently has a MRSA infection in her foot and is not appropriate for admission to the inpatient psychiatry service currently.   Daily contact with patient to assess and evaluate symptoms and progress in treatment and Medication management  KJay Schlichter MD 09/01/2016, 5:15 PM

## 2016-09-02 LAB — LIPID PANEL
CHOL/HDL RATIO: 2.5 ratio
CHOLESTEROL: 114 mg/dL (ref 0–200)
HDL: 46 mg/dL (ref >40)
LDL Cholesterol: 51 mg/dL (ref 0–99)
Triglycerides: 84 mg/dL (ref <150)
VLDL: 17 mg/dL (ref 0–40)

## 2016-09-02 LAB — GLUCOSE, CAPILLARY
GLUCOSE-CAPILLARY: 181 mg/dL — AB (ref 65–99)
Glucose-Capillary: 135 mg/dL — ABNORMAL HIGH (ref 65–99)
Glucose-Capillary: 151 mg/dL — ABNORMAL HIGH (ref 65–99)
Glucose-Capillary: 208 mg/dL — ABNORMAL HIGH (ref 65–99)

## 2016-09-02 LAB — VANCOMYCIN, TROUGH: Vancomycin Tr: 21 ug/mL (ref 15–20)

## 2016-09-02 LAB — CREATININE, SERUM
CREATININE: 0.81 mg/dL (ref 0.44–1.00)
GFR calc Af Amer: >60 mL/min (ref >60)
GFR calc non Af Amer: >60 mL/min (ref >60)

## 2016-09-02 MED ORDER — QUETIAPINE FUMARATE 300 MG PO TABS
400.0000 mg | ORAL_TABLET | Freq: Every day | ORAL | Status: DC
  Administered 2016-09-02 – 2016-09-10 (×9): 400 mg via ORAL
  Filled 2016-09-02 (×10): qty 1

## 2016-09-02 MED ORDER — VANCOMYCIN HCL IN DEXTROSE 1-5 GM/200ML-% IV SOLN
1000.0000 mg | Freq: Two times a day (BID) | INTRAVENOUS | Status: DC
  Administered 2016-09-02 – 2016-09-04 (×5): 1000 mg via INTRAVENOUS
  Filled 2016-09-02 (×7): qty 200

## 2016-09-02 NOTE — Progress Notes (Signed)
[facility] MD Progress Note  09/02/2016 8:22 PM Mary Mcdonald  MRN:  [id number]  Subjective:     Subjective:   Mary Mcdonald is a 51 y.o. female patient admitted with "I just came from [place]". She does appear to be manic and has a diagnosis of Bipolar Disorder:  Overall the patient is much calmer. She was still irritable and argumentative but was able to rest in bed and was not pacing. Speech is not as pressured and she was able to be interrupted.  Per nursing staff there is improvement over yesterday. She has been compliant with Depakote and Seroquel but is mores interested in [place]. She was not wanting to answer a lot of questions from this writer and was not cooperative with the interview. She however was not violent or aggressive. She has remained with a one-to-one sitter. She is denying any auditory or visual hallucinations. She says that she is not suicidal although she did not answer the question directly. She says that the only medication she wanted was Klonopin. She denies any problems with her appetite. She denies any problems with insomnia.  Social history: From what I can tell she is primarily located in [place]. She named several hospitals back there. At the same time she says that she was planning to relocate here to [facility]. Unclear exactly who she was planning to stay with.  Medical history: Has cellulitis. I'm unclear about the rest of her medical history. When I ask her about other illnesses she said yes to everything I ask. Couldn't name any specific medicines that she takes.  Substance abuse history: Denies use of alcohol says that she doesn't use any drugs denies any history of substance abuse treatment. Drug screen positive for cannabis. She seems to really like Klonopin.  Past Psychiatric History: Evasive about this and we don't have much in the way of any old records but she does indicate she's had bipolar disorder that has been diagnosed "all my  life". She says she's had several hospitalizations in the past. Denies any history of suicide attempts. Admits to a history of violence. She says for years she took Depakote and Seroquel but doesn't do that anymore. She to lithium.  Principal Problem: Bipolar I disorder, most recent episode (or current) manic ([place]) Diagnosis:   Patient Active Problem List   Diagnosis Date Noted  . Bipolar I disorder, most recent episode (or current) manic ([place]) [F31.10] 08/26/2016  . Cellulitis [L03.90] 08/25/2016   Total Time spent with patient: 20 minutes    Past Medical History:  Past Medical History:  Diagnosis Date  . Bipolar 1 disorder [facility])     Past Surgical History:  Procedure Laterality Date  . 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 No pertinent family history.  Social History:  History  Alcohol use Not on file     History  Drug use: Unknown    Social History   Social History  . Marital status: Widowed    Spouse name: N/A  . Number of children: N/A  . Years of education: N/A   Social History Main Topics  . Smoking status: Current Every Day Smoker  . Smokeless tobacco: Never Used  . Alcohol use None  . Drug use: Unknown  . Sexual activity: Not Asked   Other Topics Concern  . None   Social History Narrative  .  None   Additional Social History:                         Sleep: No good report  Appetite:  Good  Current Medications: Current Facility-Administered Medications  Medication Dose Route Frequency Provider Last Rate Last Dose  . amLODipine (NORVASC) tablet 10 mg  10 mg Oral Daily Harrie Foreman, MD   10 mg at 09/02/16 0803  . clonazePAM (KLONOPIN) tablet 1 mg  1 mg Oral QHS Gonzella Lex, MD   1 mg at 09/01/16 2158  . divalproex (DEPAKOTE) DR tablet 500 mg  500 mg Oral BID Chauncey Mann, MD   500  mg at 09/02/16 0803  . enoxaparin (LOVENOX) injection 40 mg  40 mg Subcutaneous Q24H Debby Crosley, MD   40 mg at 09/01/16 2157  . HYDROcodone-acetaminophen (NORCO/VICODIN) 5-325 MG per tablet 1 tablet  1 tablet Oral Q4H PRN Lance Coon, MD   1 tablet at 08/30/16 0204  . HYDROcodone-acetaminophen (NORCO/VICODIN) 5-325 MG per tablet 1-2 tablet  1-2 tablet Oral Q4H PRN Sharlotte Alamo, DPM   2 tablet at 09/02/16 1928  . insulin aspart (novoLOG) injection 0-5 Units  0-5 Units Subcutaneous QHS Loletha Grayer, MD   2 Units at 08/30/16 2200  . insulin aspart (novoLOG) injection 0-9 Units  0-9 Units Subcutaneous TID WC Loletha Grayer, MD   2 Units at 09/02/16 1720  . levothyroxine (SYNTHROID, LEVOTHROID) tablet 100 mcg  100 mcg Oral Q0600 Gonzella Lex, MD   100 mcg at 09/02/16 0522  . lisinopril (PRINIVIL,ZESTRIL) tablet 5 mg  5 mg Oral Daily Loletha Grayer, MD   5 mg at 09/02/16 0803  . LORazepam (ATIVAN) injection 2 mg  2 mg Intravenous Q6H PRN Debby Crosley, MD      . nicotine (NICODERM CQ - dosed in mg/24 hours) patch 21 mg  21 mg Transdermal Daily Henreitta Leber, MD   21 mg at 09/02/16 0804  . piperacillin-tazobactam (ZOSYN) IVPB 3.375 g  3.375 g Intravenous Q8H Merilyn Baba, RPH   3.375 g at 09/02/16 1452  . QUEtiapine (SEROQUEL) tablet 300 mg  300 mg Oral QHS Gonzella Lex, MD   300 mg at 09/01/16 2158  . vancomycin (VANCOCIN) IVPB 1000 mg/200 mL premix  1,000 mg Intravenous Q12H Debby Crosley, MD   1,000 mg at 09/02/16 1209    Lab Results:  Results for orders placed or performed during the hospital encounter of 08/25/16 (from the past 48 hour(s))  Glucose, capillary     Status: Abnormal   Collection Time: 08/31/16  9:38 PM  Result Value Ref Range   Glucose-Capillary 170 (H) 65 - 99 mg/dL   Comment 1 Notify RN   Creatinine, serum     Status: None   Collection Time: 09/01/16  4:31 AM  Result Value Ref Range   Creatinine, Ser 0.71 0.44 - 1.00 mg/dL   GFR calc non Af Amer >60 >60 mL/min    GFR calc Af Amer >60 >60 mL/min    Comment: (NOTE) The eGFR has been calculated using the CKD EPI equation. This calculation has not been validated in all clinical situations. eGFR's persistently <60 mL/min signify possible Chronic Kidney Disease.   Glucose, capillary     Status: Abnormal   Collection Time: 09/01/16  7:30 AM  Result Value Ref Range   Glucose-Capillary 132 (H) 65 - 99 mg/dL  Glucose, capillary     Status: Abnormal  Collection Time: 09/01/16 11:37 AM  Result Value Ref Range   Glucose-Capillary 230 (H) 65 - 99 mg/dL   Comment 1 Notify RN    Comment 2 Document in Chart   Glucose, capillary     Status: Abnormal   Collection Time: 09/01/16  4:24 PM  Result Value Ref Range   Glucose-Capillary 175 (H) 65 - 99 mg/dL  Glucose, capillary     Status: Abnormal   Collection Time: 09/01/16  9:11 PM  Result Value Ref Range   Glucose-Capillary 200 (H) 65 - 99 mg/dL  Lipid panel     Status: None   Collection Time: 09/02/16  4:45 AM  Result Value Ref Range   Cholesterol 114 0 - 200 mg/dL   Triglycerides 84 <150 mg/dL   HDL 46 >40 mg/dL   Total CHOL/HDL Ratio 2.5 RATIO   VLDL 17 0 - 40 mg/dL   LDL Cholesterol 51 0 - 99 mg/dL    Comment:        Total Cholesterol/HDL:CHD Risk Coronary Heart Disease Risk Table                     Men   Women  1/2 Average Risk   3.4   3.3  Average Risk       5.0   4.4  2 X Average Risk   9.6   7.1  3 X Average Risk  23.4   11.0        Use the calculated Patient Ratio above and the CHD Risk Table to determine the patient's CHD Risk.        ATP III CLASSIFICATION (LDL):  <100     mg/dL   Optimal  100-129  mg/dL   Near or Above                    Optimal  130-159  mg/dL   Borderline  160-189  mg/dL   High  >190     mg/dL   Very High   Glucose, capillary     Status: Abnormal   Collection Time: 09/02/16  7:41 AM  Result Value Ref Range   Glucose-Capillary 135 (H) 65 - 99 mg/dL  Vancomycin, trough     Status: Abnormal   Collection  Time: 09/02/16  9:21 AM  Result Value Ref Range   Vancomycin Tr 21 (HH) 15 - 20 ug/mL    Comment: CRITICAL RESULT CALLED TO, READ BACK BY AND VERIFIED WITH KAREN HAYS@1010  ON 09/02/16 BY HKP   Creatinine, serum     Status: None   Collection Time: 09/02/16  9:23 AM  Result Value Ref Range   Creatinine, Ser 0.81 0.44 - 1.00 mg/dL   GFR calc non Af Amer >60 >60 mL/min   GFR calc Af Amer >60 >60 mL/min    Comment: (NOTE) The eGFR has been calculated using the CKD EPI equation. This calculation has not been validated in all clinical situations. eGFR's persistently <60 mL/min signify possible Chronic Kidney Disease.   Glucose, capillary     Status: Abnormal   Collection Time: 09/02/16 11:04 AM  Result Value Ref Range   Glucose-Capillary 151 (H) 65 - 99 mg/dL   Comment 1 Notify RN    Comment 2 Document in Chart   Glucose, capillary     Status: Abnormal   Collection Time: 09/02/16  4:19 PM  Result Value Ref Range   Glucose-Capillary 181 (H) 65 - 99 mg/dL   Comment 1 Notify  RN    Comment 2 Document in Chart     Blood Alcohol level:  Lab Results  Component Value Date   ETH <5 27/25/3664    Metabolic Disorder Labs: Lab Results  Component Value Date   HGBA1C 6.7 (H) 08/26/2016   MPG 146 08/26/2016   No results found for: PROLACTIN Lab Results  Component Value Date   CHOL 114 09/02/2016   TRIG 84 09/02/2016   HDL 46 09/02/2016   CHOLHDL 2.5 09/02/2016   VLDL 17 09/02/2016   LDLCALC 51 09/02/2016     Musculoskeletal: Strength & Muscle Tone: Unable to fully assess Gait & Station: unsteady Patient leans: Right and N/A  Psychiatric Specialty Exam: Physical Exam   Review of Systems  Unable to perform ROS: Psychiatric disorder    Blood pressure (!) 155/82, pulse 86, temperature 98.1 F (36.7 C), temperature source Oral, resp. rate 18, height 5' 9"  (1.753 m), weight 68.2 kg (150 lb 4.8 oz), SpO2 100 %.Body mass index is 22.2 kg/m.  General Appearance: Disheveled   Eye Contact:  Fair  Speech:  Pressured  Volume:  Increased  Mood:  "How the hell do you think I am?"  Affect:  Irritable  Thought Process:  Disorganized  Orientation:  Other:  She did not answer questions  Thought Content:  Illogical, Delusions and Paranoid Ideation  Suicidal Thoughts:  No  Homicidal Thoughts:  No  Memory:  Immediate;   Poor Recent;   Poor Remote;   Poor  Judgement:  Poor  Insight:  Lacking  Psychomotor Activity:  Increased  Concentration:  Concentration: Poor and Attention Span: Poor  Recall:  Poor  Fund of Knowledge:  Poor  Language:  Good  Akathisia:  No  Handed:  Right  AIMS (if indicated):     Assets:  Others:  Not clear at this time  ADL's:  Impaired  Cognition:  Impaired,  Severe  Sleep:        Treatment Plan Summary: Ms. Mary Mcdonald is a 51 year old female with bipolar disorder from [place] who is admitted to the medicine service currently with an infected foot. She is clearly exhibiting manic symptoms.  Bipolar disorder: Most recent episode manic: The patient was placed on Seroquel 300 mg by mouth nightly by Dr. Aldona Bar and will increase to 461m po nightly today. She was also started on Depakote 500 mg by mouth twice a day for added for mood stabilization. Will have to check Depakote level in one week's time as well as liver enzymes. Will continue Klonopin 176mpo nightly for now but eventually benzos need to be discontinued. No prolongation of QTC interval on EKG. We'll need to check lipid panel, prolactin level and hemoglobin A1c.  We'll keep the patient with a one-to-one sitter for now for safety.  The patient will most likely need transfer to the inpatient psychiatry service once medically cleared. She currently has a MRSA infection in her foot and is not appropriate for admission to the inpatient psychiatry service currently.   Daily contact with patient to assess and evaluate symptoms and progress in treatment and Medication  management  KAJay SchlichterMD 09/02/2016, 8:22 PM

## 2016-09-02 NOTE — Progress Notes (Signed)
ANTIBIOTIC CONSULT NOTE   Pharmacy Consult for Vancomycin, Zosyn  Indication: cellulitis, possible osteomyelitis      Allergies  Allergen Reactions  . Lithium Rash    Patient Measurements: Height:  (175.3 cm) Weight: 150 lb 4.8 oz (68.2 kg) IBW/kg (Calculated) : 66.2 Adjusted Body Weight: 67 kg   Vital Signs: Temp: 98.8 F (37.1 C) (04/08 2354) Temp Source: Oral (04/08 2354) BP: 148/72 (04/08 2354) Pulse Rate: 78 (04/08 2354) Intake/Output from previous day: 04/08 0701 - 04/09 0700 In: 840 [P.O.:840] Out: -  Intake/Output from this shift: No intake/output data recorded.  Labs:  Recent Labs (last 2 labs)    Recent Labs  08/25/16 1436 08/26/16 0457  WBC 11.4* 7.3  HGB 11.4* 11.2*  PLT 371 328  CREATININE 0.52 0.44     Estimated Creatinine Clearance: 87.9 mL/min (by C-G formula based on SCr of 0.44 mg/dL).  Recent Labs (last 2 labs)    Recent Labs  08/27/16 0631  VANCOTROUGH 13*      Microbiology:       Recent Results (from the past 720 hour(s))  Blood Culture (routine x 2) Status: None (Preliminary result)   Collection Time: 08/25/16 4:04 PM  Result Value Ref Range Status   Specimen Description BLOOD L AC  Final   Special Requests BOTTLES DRAWN AEROBIC AND ANAEROBIC BCAV  Final   Culture NO GROWTH < 24 HOURS  Final   Report Status PENDING  Incomplete  Blood Culture (routine x 2) Status: None (Preliminary result)   Collection Time: 08/25/16 4:04 PM  Result Value Ref Range Status   Specimen Description BLOOD R HAND  Final   Special Requests BOTTLES DRAWN AEROBIC AND ANAEROBIC BCAV  Final   Culture NO GROWTH < 24 HOURS  Final   Report Status PENDING  Incomplete    Medical History:     Past Medical History:  Diagnosis Date  . Bipolar 1 disorder (HCC)     Medications:  No prescriptions prior to admission.   Assessment: CrCl = 110 ml/min Ke = 0.096 hr-1 T1/2 = 7.2 hrs Vd  = 47.7 L   Goal of Therapy:  Vancomycin trough level 15-20 mcg/ml  Plan: Expected duration 7 days with resolution of temperature and/or normalization of WBC  Zosyn 3.375 gm IV X 1 given on 4/7 @ 16:40. Zosyn 3.375 gm IV Q8H EI ordered to start on 4/8 @ 22:00.  Vancomycin 1 gm IV X 1 given on 4/7 @ 17:00.  Vancomycin 1 mg IV Q8H ordered to start on 4/8 @ 0100, 6 hrs after 1st dose (stacked dosing). This pt will reach Css by 4/9 @ 0500. Will draw 1st trough on 4/9 @ 0630, which will be at Css.   Vancomycin trough 13 mcg/mL before sixth dose. MRI today for OM eval - will increase dose to 1.25 gm IV Q8H and recheck trough tomorrow morning. If MRI shows cellulitis we can likely stay at 1 gm IV Q8H but until OM r/o will increase to 1.25 gm IV Q8H.   4/10 @ 0500 VT 24 was drawn 5 hours too early. Will redraw VT today @ 1500 to reassess.  4/10 @ 1726 16. Will continue with current dose of Vancomycin  IV every 8 hours. Will redraw VT after 5 doses to monitor.   4/12 @ 0130 VT 23. Level was drawn probably as the dose was infusing, will redraw another trough @ 0900 and reassess. No BMP in 4 days, will recheck BMP w/ am  labs to assess renal function, UOP not documented.  4/12: VT @ ~08:58 was 26, likely drawn while drug infusing. Will recheck trough level .  4/12 Vanc level 15 . According to nurse patient never received Am dose even though MAR record show trough was drawn after start of AM vanc dose.  Based on two levels drawn today, new Ke: 0.092  and t1/2 ~8 hours. Will adjust dose to Vancomycin  IV every 8 hours. Will recheck trough level 4/13 prior to 4th dose.  4/13 VT 17 . Will continue current regimen of  IV every 8 hours. Patient scheduled for D/C on oral Doxy and Augmentin. Will F/U renal function in two days if not D/Cd  4/15: Vanc Trough= 21 mcg/ml at 0921. Will transition to Vancomycin 1 gram IV q12h.  Cellulitis of L foot as well as osteomyelitis  of 2nd toe and possibly 1st toe per ID note. CX= MRSA. See ID note 08/31/16. f/u renal fxn.  Angelique Blonder, PharmD, BCPS Clinical Pharmacist 09/02/2016 10:30 AM

## 2016-09-02 NOTE — Progress Notes (Signed)
Spoke with Nate in pharmacy, verified results of Vancomycin Trough, ok to administer 1000 dose, will adjust next dose accordingly.

## 2016-09-02 NOTE — Progress Notes (Signed)
Patient ID: Mary Mcdonald, female   DOB: 02-12-1966, 51 y.o.   MRN: [id number]  [employer] PROGRESS NOTE  Mary Mcdonald XLK:[id number] DOB: [birth year] DOA: 08/25/2016 PCP: No PCP Per Patient  HPI/Subjective: wound culture showed MRSA from  Left foot, so she did not go to psych unit.  Patient says that foot pain is well controlled.  Objective: Vitals:   09/02/16 0628 09/02/16 0743  BP: (!) 142/75 (!) 156/79  Pulse: 72 71  Resp: 18 18  Temp: 98 F (36.7 C) 98 F (36.7 C)    Filed Weights   08/25/16 1417 08/25/16 2053  Weight: 72.6 kg (160 lb) 68.2 kg (150 lb 4.8 oz)    ROS: Review of Systems  Constitutional: Negative for chills and fever.  Eyes: Negative for blurred vision.  Respiratory: Negative for cough and shortness of breath.   Cardiovascular: Negative for chest pain.  Gastrointestinal: Negative for abdominal pain, constipation, diarrhea, nausea and vomiting.  Genitourinary: Negative for dysuria.  Musculoskeletal: Positive for joint pain.  Neurological: Negative for dizziness and headaches.    Exam: Physical Exam  HENT:  Nose: No mucosal edema.  Mouth/Throat: No oropharyngeal exudate.  Eyes: Conjunctivae, EOM and lids are normal. Pupils are equal, round, and reactive to light.  Neck: Carotid bruit is not present. No thyromegaly present.  Cardiovascular: Regular rhythm, S1 normal, S2 normal and normal heart sounds.   Respiratory: She has no decreased breath sounds. She has no wheezes. She has no rhonchi. She has no rales.  GI: Soft. Bowel sounds are normal. There is no tenderness.  Musculoskeletal:       Right ankle: She exhibits no swelling.       Left ankle: She exhibits decreased range of motion. She exhibits no swelling.  Neurological: She is alert. No cranial nerve deficit.  Skin: Skin is warm. Nails show no clubbing.  Both feet covered with dressings by podiatry  Psychiatric: She has a normal mood and affect. Her speech is not rapid and/or pressured.    dressing present for left foot,  Data Reviewed: Basic Metabolic Panel:  Recent Labs Lab 08/30/16 0508 09/01/16 0431 09/02/16 0923  NA 139  --   --   K 3.3*  --   --   CL 109  --   --   CO2 26  --   --   GLUCOSE 231*  --   --   BUN 15  --   --   CREATININE 0.76 0.71 0.81  CALCIUM 9.0  --   --    Liver Function Tests: No results for input(s): AST, ALT, ALKPHOS, BILITOT, PROT, ALBUMIN in the last 168 hours. CBC: No results for input(s): WBC, NEUTROABS, HGB, HCT, MCV, PLT in the last 168 hours.   Recent Results (from the past 240 hour(s))  Blood Culture (routine x 2)     Status: None   Collection Time: 08/25/16  4:04 PM  Result Value Ref Range Status   Specimen Description BLOOD  L AC  Final   Special Requests BOTTLES DRAWN AEROBIC AND ANAEROBIC  BCAV  Final   Culture NO GROWTH 5 DAYS  Final   Report Status 08/30/2016 FINAL  Final  Blood Culture (routine x 2)     Status: None   Collection Time: 08/25/16  4:04 PM  Result Value Ref Range Status   Specimen Description BLOOD R HAND  Final   Special Requests BOTTLES DRAWN AEROBIC AND ANAEROBIC  BCAV  Final   Culture NO GROWTH 5  DAYS  Final   Report Status 08/30/2016 FINAL  Final  Urine culture     Status: Abnormal   Collection Time: 08/26/16 12:30 AM  Result Value Ref Range Status   Specimen Description URINE, RANDOM  Final   Special Requests NONE  Final   Culture (A)  Final    <10,000 COLONIES/mL INSIGNIFICANT GROWTH Performed at [facility], [address] [place], [place] [address]    Report Status 08/27/2016 FINAL  Final  Aerobic Culture (superficial specimen)     Status: None   Collection Time: 08/26/16  6:15 PM  Result Value Ref Range Status   Specimen Description FOOT LEFT  Final   Special Requests NONE  Final   Gram Stain   Final    FEW WBC PRESENT, PREDOMINANTLY PMN RARE GRAM POSITIVE COCCI IN PAIRS Performed at [facility], [address] [place], [place] [address]    Culture   Final     RARE METHICILLIN RESISTANT STAPHYLOCOCCUS AUREUS MULTIPLE ORGANISMS PRESENT, NONE PREDOMINANT    Report Status 08/30/2016 FINAL  Final   Organism ID, Bacteria METHICILLIN RESISTANT STAPHYLOCOCCUS AUREUS  Final      Susceptibility   Methicillin resistant staphylococcus aureus - MIC*    CIPROFLOXACIN >=8 RESISTANT Resistant     ERYTHROMYCIN >=8 RESISTANT Resistant     GENTAMICIN <=0.5 SENSITIVE Sensitive     OXACILLIN >=4 RESISTANT Resistant     TETRACYCLINE <=1 SENSITIVE Sensitive     VANCOMYCIN 1 SENSITIVE Sensitive     TRIMETH/SULFA <=10 SENSITIVE Sensitive     CLINDAMYCIN <=0.25 SENSITIVE Sensitive     RIFAMPIN <=0.5 SENSITIVE Sensitive     Inducible Clindamycin NEGATIVE Sensitive     * RARE METHICILLIN RESISTANT STAPHYLOCOCCUS AUREUS  Aerobic/Anaerobic Culture (surgical/deep wound)     Status: None (Preliminary result)   Collection Time: 08/28/16  1:03 PM  Result Value Ref Range Status   Specimen Description TOE  Final   Special Requests SECOND TOE LEFT FOOT BONE CULTURE  Final   Gram Stain   Final    MODERATE WBC PRESENT,BOTH PMN AND MONONUCLEAR NO ORGANISMS SEEN    Culture   Final    NO GROWTH 3 DAYS Performed at [facility], [address] [place], [place] [address]    Report Status PENDING  Incomplete  Aerobic Culture (superficial specimen)     Status: None (Preliminary result)   Collection Time: 08/31/16 12:22 PM  Result Value Ref Range Status   Specimen Description ULCER TOE left great  Final   Special Requests NONE  Final   Gram Stain   Final    FEW WBC PRESENT, PREDOMINANTLY PMN NO ORGANISMS SEEN    Culture   Final    NO GROWTH 2 DAYS Performed at [facility], [address] [place], [place] [address]    Report Status PENDING  Incomplete     Studies: No results found.  Scheduled Meds: . amLODipine  10 mg Oral Daily  . clonazePAM  1 mg Oral QHS  . divalproex  500 mg Oral BID  . enoxaparin (LOVENOX) injection  40 mg Subcutaneous  Q24H  . insulin aspart  0-5 Units Subcutaneous QHS  . insulin aspart  0-9 Units Subcutaneous TID WC  . levothyroxine  100 mcg Oral Q0600  . lisinopril  5 mg Oral Daily  . nicotine  21 mg Transdermal Daily  . piperacillin-tazobactam (ZOSYN)  IV  3.375 g Intravenous Q8H  . QUEtiapine  300 mg Oral QHS  . vancomycin  1,000 mg Intravenous Q12H    Assessment/Plan:  1. 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 MRSA:from wound cultures ; continue antibiotics with vancomycin, continue isolation, can not be  transferred to [facility] because of MRSA and isolation.  3.  4. Type 2 diabetes mellitus. Good control hemoglobin A1c 6.7. On sliding scale.  5.  6. Essential hypertension on low-dose lisinopril, amlodipine. 7.  8. Hypothyroidism unspecified on levothyroxine 9.  Bipolar disorder; continue  Klonopin, Seroquel,depakote psych is following, continue IVC.  Code Status:     Code Status Orders        Start     Ordered   08/25/16 2057  Full code  Continuous     08/25/16 2056    Code Status History    Date Active Date Inactive Code Status Order ID Comments User Context   This patient has a current code status but no historical code status.      Disposition Plan;  Consultants:  Podiatry  Psychiatry  Antibiotics:  Vancomycin  Zosyn  Time spent: 22 minutes  [employer]

## 2016-09-03 LAB — CBC
HCT: 32.7 % — ABNORMAL LOW (ref 35.0–47.0)
Hemoglobin: 10.8 g/dL — ABNORMAL LOW (ref 12.0–16.0)
MCH: 26.6 pg (ref 26.0–34.0)
MCHC: 33.1 g/dL (ref 32.0–36.0)
MCV: 80.4 fL (ref 80.0–100.0)
PLATELETS: 284 10*3/uL (ref 150–440)
RBC: 4.07 MIL/uL (ref 3.80–5.20)
RDW: 14.1 % (ref 11.5–14.5)
WBC: 7 10*3/uL (ref 3.6–11.0)

## 2016-09-03 LAB — BASIC METABOLIC PANEL
ANION GAP: 6 (ref 5–15)
BUN: 11 mg/dL (ref 6–20)
CALCIUM: 9.3 mg/dL (ref 8.9–10.3)
CO2: 29 mmol/L (ref 22–32)
CREATININE: 0.78 mg/dL (ref 0.44–1.00)
Chloride: 102 mmol/L (ref 101–111)
Glucose, Bld: 267 mg/dL — ABNORMAL HIGH (ref 65–99)
Potassium: 3.8 mmol/L (ref 3.5–5.1)
Sodium: 137 mmol/L (ref 135–145)

## 2016-09-03 LAB — GLUCOSE, CAPILLARY
GLUCOSE-CAPILLARY: 161 mg/dL — AB (ref 65–99)
GLUCOSE-CAPILLARY: 209 mg/dL — AB (ref 65–99)
Glucose-Capillary: 154 mg/dL — ABNORMAL HIGH (ref 65–99)
Glucose-Capillary: 148 mg/dL — ABNORMAL HIGH (ref 65–99)

## 2016-09-03 LAB — AEROBIC CULTURE W GRAM STAIN (SUPERFICIAL SPECIMEN): Culture: NO GROWTH

## 2016-09-03 LAB — PROLACTIN: Prolactin: 208.6 ng/mL — ABNORMAL HIGH (ref 4.8–23.3)

## 2016-09-03 LAB — AEROBIC CULTURE  (SUPERFICIAL SPECIMEN)

## 2016-09-03 LAB — HEMOGLOBIN A1C
Hgb A1c MFr Bld: 7 % — ABNORMAL HIGH (ref 4.8–5.6)
MEAN PLASMA GLUCOSE: 154 mg/dL

## 2016-09-03 NOTE — Anesthesia Preprocedure Evaluation (Addendum)
Anesthesia Evaluation  Patient identified by MRN, date of birth, ID band Patient awake    Reviewed: Allergy & Precautions, NPO status , Patient's Chart, lab work & pertinent test results  History of Anesthesia Complications Negative for: history of anesthetic complications  Airway Mallampati: I  TM Distance: >3 FB Neck ROM: Full    Dental  (+) Poor Dentition, Missing   Pulmonary neg sleep apnea, neg COPD, Current Smoker,    breath sounds clear to auscultation- rhonchi (-) wheezing      Cardiovascular hypertension, (-) CAD and (-) Past MI  Rhythm:Regular Rate:Normal - Systolic murmurs and - Diastolic murmurs    Neuro/Psych PSYCHIATRIC DISORDERS Bipolar Disorder negative neurological ROS     GI/Hepatic negative GI ROS, Neg liver ROS,   Endo/Other  diabetesHypothyroidism   Renal/GU negative Renal ROS     Musculoskeletal negative musculoskeletal ROS (+)   Abdominal (+) - obese,   Peds  Hematology negative hematology ROS (+)   Anesthesia Other Findings Past Medical History: No date: Bipolar 1 disorder (HCC)   Reproductive/Obstetrics                             Anesthesia Physical Anesthesia Plan  ASA: III  Anesthesia Plan: General   Post-op Pain Management:    Induction: Intravenous  Airway Management Planned: Natural Airway  Additional Equipment:   Intra-op Plan:   Post-operative Plan:   Informed Consent: I have reviewed the patients History and Physical, chart, labs and discussed the procedure including the risks, benefits and alternatives for the proposed anesthesia with the patient or authorized representative who has indicated his/her understanding and acceptance.   Dental advisory given  Plan Discussed with: CRNA and Anesthesiologist  Anesthesia Plan Comments:         Anesthesia Quick Evaluation

## 2016-09-03 NOTE — Progress Notes (Signed)
Patient ID: Mary Mcdonald, female   DOB: Dec 08, 1965, 51 y.o.   MRN: [id number]  [employer] [facility] PROGRESS NOTE  Mary Mcdonald WUJ:[id number] DOB: 06-28-1965 DOA: 08/25/2016 PCP: No PCP Per Patient  HPI/Subjective: wound culture showed MRSA from  Left foot, so she did not go to psych unit.  Patient says that foot pain is well controlled.  Objective: Vitals:   09/03/16 0401 09/03/16 0800  BP: 134/68 (!) 167/99  Pulse: 73 75  Resp:  18  Temp: 97.7 F (36.5 C) 97.9 F (36.6 C)    Filed Weights   08/25/16 1417 08/25/16 2053  Weight: 72.6 kg (160 lb) 68.2 kg (150 lb 4.8 oz)    ROS: Review of Systems  Constitutional: Negative for chills and fever.  Eyes: Negative for blurred vision.  Respiratory: Negative for cough and shortness of breath.   Cardiovascular: Negative for chest pain.  Gastrointestinal: Negative for abdominal pain, constipation, diarrhea, nausea and vomiting.  Genitourinary: Negative for dysuria.  Musculoskeletal: Positive for joint pain.  Neurological: Negative for dizziness and headaches.    Exam: Physical Exam  HENT:  Nose: No mucosal edema.  Mouth/Throat: No oropharyngeal exudate.  Eyes: Conjunctivae, EOM and lids are normal. Pupils are equal, round, and reactive to light.  Neck: Carotid bruit is not present. No thyromegaly present.  Cardiovascular: Regular rhythm, S1 normal, S2 normal and normal heart sounds.   Respiratory: She has no decreased breath sounds. She has no wheezes. She has no rhonchi. She has no rales.  GI: Soft. Bowel sounds are normal. There is no tenderness.  Musculoskeletal:       Right ankle: She exhibits no swelling.       Left ankle: She exhibits decreased range of motion. She exhibits no swelling.  Neurological: She is alert. No cranial nerve deficit.  Skin: Skin is warm. Nails show no clubbing.  Both feet covered with dressings by podiatry  Psychiatric: She has a normal mood and affect. Her speech is not rapid and/or pressured.    dressing present for left foot,  Data Reviewed: Basic Metabolic Panel:  Recent Labs Lab 08/30/16 0508 09/01/16 0431 09/02/16 0923 09/03/16 0351  NA 139  --   --  137  K 3.3*  --   --  3.8  CL 109  --   --  102  CO2 26  --   --  29  GLUCOSE 231*  --   --  267*  BUN 15  --   --  11  CREATININE 0.76 0.71 0.81 0.78  CALCIUM 9.0  --   --  9.3   Liver Function Tests: No results for input(s): AST, ALT, ALKPHOS, BILITOT, PROT, ALBUMIN in the last 168 hours. CBC:  Recent Labs Lab 09/03/16 0351  WBC 7.0  HGB 10.8*  HCT 32.7*  MCV 80.4  PLT 284     Recent Results (from the past 240 hour(s))  Blood Culture (routine x 2)     Status: None   Collection Time: 08/25/16  4:04 PM  Result Value Ref Range Status   Specimen Description BLOOD  L AC  Final   Special Requests BOTTLES DRAWN AEROBIC AND ANAEROBIC  BCAV  Final   Culture NO GROWTH 5 DAYS  Final   Report Status 08/30/2016 FINAL  Final  Blood Culture (routine x 2)     Status: None   Collection Time: 08/25/16  4:04 PM  Result Value Ref Range Status   Specimen Description BLOOD R HAND  Final  Special Requests BOTTLES DRAWN AEROBIC AND ANAEROBIC  BCAV  Final   Culture NO GROWTH 5 DAYS  Final   Report Status 08/30/2016 FINAL  Final  Urine culture     Status: Abnormal   Collection Time: 08/26/16 12:30 AM  Result Value Ref Range Status   Specimen Description URINE, RANDOM  Final   Special Requests NONE  Final   Culture (A)  Final    <10,000 COLONIES/mL INSIGNIFICANT GROWTH Performed at [facility], [address], [place], [place] [address]    Report Status 08/27/2016 FINAL  Final  Aerobic Culture (superficial specimen)     Status: None   Collection Time: 08/26/16  6:15 PM  Result Value Ref Range Status   Specimen Description FOOT LEFT  Final   Special Requests NONE  Final   Gram Stain   Final    FEW WBC PRESENT, PREDOMINANTLY PMN RARE GRAM POSITIVE COCCI IN PAIRS Performed at [facility], [address] [place], [place] [address]    Culture   Final    RARE METHICILLIN RESISTANT STAPHYLOCOCCUS AUREUS MULTIPLE ORGANISMS PRESENT, NONE PREDOMINANT    Report Status 08/30/2016 FINAL  Final   Organism ID, Bacteria METHICILLIN RESISTANT STAPHYLOCOCCUS AUREUS  Final      Susceptibility   Methicillin resistant staphylococcus aureus - MIC*    CIPROFLOXACIN >=8 RESISTANT Resistant     ERYTHROMYCIN >=8 RESISTANT Resistant     GENTAMICIN <=0.5 SENSITIVE Sensitive     OXACILLIN >=4 RESISTANT Resistant     TETRACYCLINE <=1 SENSITIVE Sensitive     VANCOMYCIN 1 SENSITIVE Sensitive     TRIMETH/SULFA <=10 SENSITIVE Sensitive     CLINDAMYCIN <=0.25 SENSITIVE Sensitive     RIFAMPIN <=0.5 SENSITIVE Sensitive     Inducible Clindamycin NEGATIVE Sensitive     * RARE METHICILLIN RESISTANT STAPHYLOCOCCUS AUREUS  Aerobic/Anaerobic Culture (surgical/deep wound)     Status: None (Preliminary result)   Collection Time: 08/28/16  1:03 PM  Result Value Ref Range Status   Specimen Description TOE  Final   Special Requests SECOND TOE LEFT FOOT BONE CULTURE  Final   Gram Stain   Final    MODERATE WBC PRESENT,BOTH PMN AND MONONUCLEAR NO ORGANISMS SEEN    Culture   Final    HOLDING FOR POSSIBLE ANAEROBE Performed at [facility], [address], [place], [place] [address]    Report Status PENDING  Incomplete  Aerobic Culture (superficial specimen)     Status: None (Preliminary result)   Collection Time: 08/31/16 12:22 PM  Result Value Ref Range Status   Specimen Description ULCER TOE left great  Final   Special Requests NONE  Final   Gram Stain   Final    FEW WBC PRESENT, PREDOMINANTLY PMN NO ORGANISMS SEEN    Culture   Final    NO GROWTH 2 DAYS Performed at [facility], [address], [place], [place] [address]    Report Status PENDING  Incomplete     Studies: No results found.  Scheduled Meds: . amLODipine  10 mg Oral Daily  . clonazePAM  1 mg Oral QHS  . divalproex  500 mg  Oral BID  . enoxaparin (LOVENOX) injection  40 mg Subcutaneous Q24H  . insulin aspart  0-5 Units Subcutaneous QHS  . insulin aspart  0-9 Units Subcutaneous TID WC  . levothyroxine  100 mcg Oral Q0600  . lisinopril  5 mg Oral Daily  . nicotine  21 mg  Transdermal Daily  . piperacillin-tazobactam (ZOSYN)  IV  3.375 g Intravenous Q8H  . QUEtiapine  400 mg Oral QHS  . vancomycin  1,000 mg Intravenous Q12H    Assessment/Plan:  1. 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 MRSA:from wound cultures ; continue antibiotics with vancomycin, continue isolation, can not be  transferred to [facility] because of MRSA and isolation.  3.  4. Type 2 diabetes mellitus. Good control hemoglobin A1c 6.7. On sliding scale.  5.  6. Essential hypertension on low-dose lisinopril, amlodipine. 7.  8. Hypothyroidism unspecified on levothyroxine 9.  Bipolar disorder; continue  Klonopin, Seroquel,depakote psych is following, continue IVC.  Code Status:     Code Status Orders        Start     Ordered   08/25/16 2057  Full code  Continuous     08/25/16 2056    Code Status History    Date Active Date Inactive Code Status Order ID Comments User Context   This patient has a current code status but no historical code status.      Disposition Plan;  Consultants:  Podiatry  Psychiatry  Antibiotics:  Vancomycin  Zosyn  Time spent: 22 minutes  [employer]

## 2016-09-03 NOTE — Plan of Care (Signed)
Problem: Education: Goal: Knowledge of the prescribed therapeutic regimen will improve Outcome: Progressing Continued IV abx therapy, plan for Left great toe amputation tomorrow with Alberteen Spindle MD,

## 2016-09-03 NOTE — Consult Note (Signed)
[facility] Face-to-Face Psychiatry Consult   Reason for Consult:  Consult for 51 year old woman who presented to the emergency room last night allegedly for pain in her foot but who was clearly agitated psychotic and showing manic symptoms. Referring Physician:  Leslye Peer Patient Identification: Mary Mcdonald MRN:  [id number] Principal Diagnosis: Bipolar I disorder, most recent episode (or current) manic ([place]) Diagnosis:   Patient Active Problem List   Diagnosis Date Noted  . Bipolar I disorder, most recent episode (or current) manic ([place]) [F31.10] 08/26/2016  . Cellulitis [L03.90] 08/25/2016    Total Time spent with patient: 20 minutes  Subjective:   Mary Mcdonald is a 51 y.o. female patient admitted with "I just came from [place]".  Follow-up for Monday the 16th. Patient seen. Chart reviewed. Patient was calm and reasonably pleasant even though she now understands that there had been a plan in place to have her transferred to the psychiatric unit. Patient is difficult to understand. Seems to be a little bit sedated and confused in her speech. Nevertheless she does basically understand her medical plan. She told me that there was a plan to amputate her great toe tomorrow and that Dr. Caryl Comes had given her this information. Chart shows that she is absolutely correct about that. Appears to be tolerating her medicine reasonably well. Still has a bit of a euphoric affect.  HPI:  Patient interviewed. Chart reviewed. 51 year old woman presented to the emergency room last night. She was reportedly agitated disorganized and bizarre in her behavior even before getting it made it to the ER. Law enforcement reported that she had been urinating in public. Patient was complaining of pain in her left foot but then was only partially cooperative with evaluation of it. She was noted to be disorganized angry agitated and possibly threatening and had to be given forced medicines in the emergency room. On interview today the  patient says that she just came here from [place] on Friday. Apparently she was planning to stay with someone in [place] but she says that they threw her out and told her to go to the shelter. Her story is rambling and disorganized and hard to make complete sense of. Patient indicates that she is not currently taking any psychiatric medicine. She is evasive about most specific psychiatric questions. Claims to not be having any particular problems with sleep or appetite. Denies suicidal or homicidal ideation. She says that she used to have hallucinations years ago but no longer does. She says the only psychiatric medicine she takes currently as clonazepam because it helps her to sleep. Denies that she is drinking or using any drugs although drug screen is positive for marijuana.  Social history: From what I can tell she is primarily located in [place]. She named several hospitals back there. At the same time she says that she was planning to relocate here to [facility]. Unclear exactly who she was planning to stay with.  Medical history: Has cellulitis. I'm unclear about the rest of her medical history. When I ask her about other illnesses she said yes to everything I ask. Couldn't name any specific medicines that she takes.  Substance abuse history: Denies use of alcohol says that she doesn't use any drugs denies any history of substance abuse treatment. Drug screen positive for cannabis  Past Psychiatric History: Evasive about this and we don't have much in the way of any old records but she does indicate she's had bipolar disorder that has been diagnosed "all  my life". She says she's had several hospitalizations in the past. Denies any history of suicide attempts. Admits to a history of violence. She says for years she took Depakote and Seroquel but doesn't do that anymore. She to lithium.  Risk to Self: Is patient at risk for suicide?: No (IVC at this time with  1:1 sitter) Risk to Others:   Prior Inpatient Therapy:   Prior Outpatient Therapy:    Past Medical History:  Past Medical History:  Diagnosis Date  . Bipolar 1 disorder [facility])     Past Surgical History:  Procedure Laterality Date  . 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 No pertinent family history. Family Psychiatric  History: Does not know of any Social History:  History  Alcohol use Not on file     History  Drug use: Unknown    Social History   Social History  . Marital status: Widowed    Spouse name: N/A  . Number of children: N/A  . Years of education: N/A   Social History Main Topics  . Smoking status: Current Every Day Smoker  . Smokeless tobacco: Never Used  . Alcohol use None  . Drug use: Unknown  . Sexual activity: Not Asked   Other Topics Concern  . None   Social History Narrative  . None   Additional Social History:    Allergies:   Allergies  Allergen Reactions  . Lithium Rash    Labs:  Results for orders placed or performed during the hospital encounter of 08/25/16 (from the past 48 hour(s))  Glucose, capillary     Status: Abnormal   Collection Time: 09/01/16  9:11 PM  Result Value Ref Range   Glucose-Capillary 200 (H) 65 - 99 mg/dL  Lipid panel     Status: None   Collection Time: 09/02/16  4:45 AM  Result Value Ref Range   Cholesterol 114 0 - 200 mg/dL   Triglycerides 84 <150 mg/dL   HDL 46 >40 mg/dL   Total CHOL/HDL Ratio 2.5 RATIO   VLDL 17 0 - 40 mg/dL   LDL Cholesterol 51 0 - 99 mg/dL    Comment:        Total Cholesterol/HDL:CHD Risk Coronary Heart Disease Risk Table                     Men   Women  1/2 Average Risk   3.4   3.3  Average Risk       5.0   4.4  2 X Average Risk   9.6   7.1  3 X Average Risk  23.4   11.0        Use the calculated Patient Ratio above and the CHD  Risk Table to determine the patient's CHD Risk.        ATP III CLASSIFICATION (LDL):  <100     mg/dL   Optimal  100-129  mg/dL   Near or Above                    Optimal  130-159  mg/dL   Borderline  160-189  mg/dL   High  >190     mg/dL   Very High   Prolactin     Status: Abnormal   Collection Time: 09/02/16  4:45 AM  Result Value Ref Range  Prolactin 208.6 (H) 4.8 - 23.3 ng/mL    Comment: (NOTE) Performed At: [facility] [place], [place] [id number] Lindon Romp MD [id number]   Hemoglobin A1c     Status: Abnormal   Collection Time: 09/02/16  4:45 AM  Result Value Ref Range   Hgb A1c MFr Bld 7.0 (H) 4.8 - 5.6 %    Comment: (NOTE)         Pre-diabetes: 5.7 - 6.4         Diabetes: >6.4         Glycemic control for adults with diabetes: <7.0    Mean Plasma Glucose 154 mg/dL    Comment: (NOTE) Performed At: [facility] [address] [place], [place] [id number] Lindon Romp MD [id number]   Glucose, capillary     Status: Abnormal   Collection Time: 09/02/16  7:41 AM  Result Value Ref Range   Glucose-Capillary 135 (H) 65 - 99 mg/dL  Vancomycin, trough     Status: Abnormal   Collection Time: 09/02/16  9:21 AM  Result Value Ref Range   Vancomycin Tr 21 (HH) 15 - 20 ug/mL    Comment: CRITICAL RESULT CALLED TO, READ BACK BY AND VERIFIED WITH KAREN HAYS'@1010'$  ON 09/02/16 BY HKP   Creatinine, serum     Status: None   Collection Time: 09/02/16  9:23 AM  Result Value Ref Range   Creatinine, Ser 0.81 0.44 - 1.00 mg/dL   GFR calc non Af Amer >60 >60 mL/min   GFR calc Af Amer >60 >60 mL/min    Comment: (NOTE) The eGFR has been calculated using the CKD EPI equation. This calculation has not been validated in all clinical situations. eGFR's persistently <60 mL/min signify possible Chronic Kidney Disease.   Glucose, capillary     Status: Abnormal   Collection Time: 09/02/16 11:04 AM  Result Value Ref Range   Glucose-Capillary 151  (H) 65 - 99 mg/dL   Comment 1 Notify RN    Comment 2 Document in Chart   Glucose, capillary     Status: Abnormal   Collection Time: 09/02/16  4:19 PM  Result Value Ref Range   Glucose-Capillary 181 (H) 65 - 99 mg/dL   Comment 1 Notify RN    Comment 2 Document in Chart   Glucose, capillary     Status: Abnormal   Collection Time: 09/02/16  9:16 PM  Result Value Ref Range   Glucose-Capillary 208 (H) 65 - 99 mg/dL   Comment 1 Document in Chart   CBC     Status: Abnormal   Collection Time: 09/03/16  3:51 AM  Result Value Ref Range   WBC 7.0 3.6 - 11.0 K/uL   RBC 4.07 3.80 - 5.20 MIL/uL   Hemoglobin 10.8 (L) 12.0 - 16.0 g/dL   HCT 32.7 (L) 35.0 - 47.0 %   MCV 80.4 80.0 - 100.0 fL   MCH 26.6 26.0 - 34.0 pg   MCHC 33.1 32.0 - 36.0 g/dL   RDW 14.1 11.5 - 14.5 %   Platelets 284 150 - 440 K/uL  Basic metabolic panel     Status: Abnormal   Collection Time: 09/03/16  3:51 AM  Result Value Ref Range   Sodium 137 135 - 145 mmol/L   Potassium 3.8 3.5 - 5.1 mmol/L   Chloride 102 101 - 111 mmol/L   CO2 29 22 - 32 mmol/L   Glucose, Bld 267 (H) 65 - 99 mg/dL   BUN 11 6 -  20 mg/dL   Creatinine, Ser 0.78 0.44 - 1.00 mg/dL   Calcium 9.3 8.9 - 10.3 mg/dL   GFR calc non Af Amer >60 >60 mL/min   GFR calc Af Amer >60 >60 mL/min    Comment: (NOTE) The eGFR has been calculated using the CKD EPI equation. This calculation has not been validated in all clinical situations. eGFR's persistently <60 mL/min signify possible Chronic Kidney Disease.    Anion gap 6 5 - 15  Glucose, capillary     Status: Abnormal   Collection Time: 09/03/16  7:51 AM  Result Value Ref Range   Glucose-Capillary 154 (H) 65 - 99 mg/dL  Glucose, capillary     Status: Abnormal   Collection Time: 09/03/16 11:39 AM  Result Value Ref Range   Glucose-Capillary 161 (H) 65 - 99 mg/dL  Glucose, capillary     Status: Abnormal   Collection Time: 09/03/16  4:45 PM  Result Value Ref Range   Glucose-Capillary 209 (H) 65 - 99 mg/dL      Current Facility-Administered Medications  Medication Dose Route Frequency Provider Last Rate Last Dose  . amLODipine (NORVASC) tablet 10 mg  10 mg Oral Daily Harrie Foreman, MD   10 mg at 09/03/16 0853  . clonazePAM (KLONOPIN) tablet 1 mg  1 mg Oral QHS Gonzella Lex, MD   1 mg at 09/02/16 2125  . divalproex (DEPAKOTE) DR tablet 500 mg  500 mg Oral BID Chauncey Mann, MD   500 mg at 09/03/16 0853  . enoxaparin (LOVENOX) injection 40 mg  40 mg Subcutaneous Q24H Debby Crosley, MD   40 mg at 09/02/16 2125  . HYDROcodone-acetaminophen (NORCO/VICODIN) 5-325 MG per tablet 1 tablet  1 tablet Oral Q4H PRN Lance Coon, MD   1 tablet at 08/30/16 0204  . HYDROcodone-acetaminophen (NORCO/VICODIN) 5-325 MG per tablet 1-2 tablet  1-2 tablet Oral Q4H PRN Sharlotte Alamo, DPM   2 tablet at 09/03/16 1536  . insulin aspart (novoLOG) injection 0-5 Units  0-5 Units Subcutaneous QHS Loletha Grayer, MD   2 Units at 09/02/16 2124  . insulin aspart (novoLOG) injection 0-9 Units  0-9 Units Subcutaneous TID WC Loletha Grayer, MD   3 Units at 09/03/16 1757  . levothyroxine (SYNTHROID, LEVOTHROID) tablet 100 mcg  100 mcg Oral Q0600 Gonzella Lex, MD   100 mcg at 09/03/16 0511  . lisinopril (PRINIVIL,ZESTRIL) tablet 5 mg  5 mg Oral Daily Loletha Grayer, MD   5 mg at 09/03/16 0854  . LORazepam (ATIVAN) injection 2 mg  2 mg Intravenous Q6H PRN Debby Crosley, MD      . nicotine (NICODERM CQ - dosed in mg/24 hours) patch 21 mg  21 mg Transdermal Daily Henreitta Leber, MD   21 mg at 09/03/16 0854  . piperacillin-tazobactam (ZOSYN) IVPB 3.375 g  3.375 g Intravenous Q8H Merilyn Baba, RPH   3.375 g at 09/03/16 1414  . QUEtiapine (SEROQUEL) tablet 400 mg  400 mg Oral QHS Chauncey Mann, MD   400 mg at 09/02/16 2125  . vancomycin (VANCOCIN) IVPB 1000 mg/200 mL premix  1,000 mg Intravenous Q12H Debby Crosley, MD   1,000 mg at 09/03/16 1202    Musculoskeletal: Strength & Muscle Tone: within normal limits Gait & Station:  normal Patient leans: N/A  Psychiatric Specialty Exam: Physical Exam  Nursing note and vitals reviewed. Constitutional: She appears well-developed and well-nourished.  HENT:  Head: Normocephalic and atraumatic.  Eyes: Conjunctivae are normal. Pupils are equal, round,  and reactive to light.  Neck: Normal range of motion.  Cardiovascular: Regular rhythm and normal heart sounds.   Respiratory: Effort normal. No respiratory distress.  GI: Soft.  Musculoskeletal: Normal range of motion.       Feet:  Neurological: She is alert.  Skin: Skin is warm and dry.  Psychiatric: Her affect is labile and inappropriate. Her affect is not angry. Her speech is tangential. She is not agitated and not hyperactive. Thought content is paranoid. She expresses impulsivity. She expresses no homicidal and no suicidal ideation. She exhibits abnormal recent memory. She is inattentive.    Review of Systems  Constitutional: Negative.   HENT: Negative.   Eyes: Negative.   Respiratory: Negative.   Cardiovascular: Negative.   Gastrointestinal: Negative.   Musculoskeletal: Negative.   Skin: Negative.   Neurological: Negative.   Psychiatric/Behavioral: Positive for memory loss. Negative for depression, hallucinations, substance abuse and suicidal ideas. The patient is not nervous/anxious and does not have insomnia.     Blood pressure (!) 170/87, pulse 92, temperature 98.2 F (36.8 C), resp. rate 18, height 5' 9" (1.753 m), weight 68.2 kg (150 lb 4.8 oz), SpO2 100 %.Body mass index is 22.2 kg/m.  General Appearance: Casual  Eye Contact:  Fair  Speech:  Garbled and Pressured  Volume:  Increased  Mood:  Euphoric and Irritable  Affect:  Inappropriate and Labile  Thought Process:  Disorganized  Orientation:  Other:  She knew she was in a hospital and could tell me the name of it. She told me the year was 1967 but am not sure she really understood the question.  Thought Content:  Rumination  Suicidal Thoughts:   No  Homicidal Thoughts:  No  Memory:  Immediate;   Fair Recent;   Poor Remote;   Fair  Judgement:  Impaired  Insight:  Shallow  Psychomotor Activity:  Increased and Restlessness  Concentration:  Concentration: Poor  Recall:  Poor  Fund of Knowledge:  Poor  Language:  Fair  Akathisia:  No  Handed:  Right  AIMS (if indicated):     Assets:  Resilience  ADL's:  Intact  Cognition:  Impaired,  Mild  Sleep:        Treatment Plan Summary: Daily contact with patient to assess and evaluate symptoms and progress in treatment, Medication management and Plan Psychiatrically she is still showing some euphoria some disorganized thinking although she does understand the plan to amputate her toe and the reason for it. I think that based on my conversation with her she does have capacity to give consent about the amputation despite the commitment and ongoing mania. I will continue the commitment for now. At this point I am still thinking she needs further inpatient psychiatric treatment although whether we will ever be able to get her downstairs is unclear. I reviewed Dr. Waylan Boga note from the weekend and agree with the increased dose of Seroquel. Continue Seroquel and Depakote. I will continue to follow-up during the hospital stay.  Disposition: Recommend psychiatric Inpatient admission when medically cleared. Supportive therapy provided about ongoing stressors.  Alethia Berthold, MD 09/03/2016 8:10 PM

## 2016-09-03 NOTE — Progress Notes (Signed)
6 Days Post-Op  Subjective: Patient seen. No complaints of pain  Objective: Vital signs in last 24 hours: Temp:  [97.7 F (36.5 C)-98.2 F (36.8 C)] 97.9 F (36.6 C) (04/16 0800) Pulse Rate:  [73-86] 75 (04/16 0800) Resp:  [18] 18 (04/16 0800) BP: (134-187)/(68-99) 167/99 (04/16 0800) SpO2:  [99 %-100 %] 100 % (04/16 0800) Last BM Date: 09/02/16  Intake/Output from previous day: 04/15 0701 - 04/16 0700 In: 1990 [P.O.:1440; IV Piggyback:550] Out: -  Intake/Output this shift: No intake/output data recorded.  The second toe amputation site is healing well with no drainage. There is still some mild drainage from the ulceration on the left hallux. Upon removal of the packing a moderate amount of purulence is easily expressed through the base of the wound today.  Lab Results:   Recent Labs  09/03/16 0351  WBC 7.0  HGB 10.8*  HCT 32.7*  PLT 284   BMET  Recent Labs  09/02/16 0923 09/03/16 0351  NA  --  137  K  --  3.8  CL  --  102  CO2  --  29  GLUCOSE  --  267*  BUN  --  11  CREATININE 0.81 0.78  CALCIUM  --  9.3   PT/INR No results for input(s): LABPROT, INR in the last 72 hours. ABG No results for input(s): PHART, HCO3 in the last 72 hours.  Invalid input(s): PCO2, PO2  Studies/Results: No results found.  Anti-infectives: Anti-infectives    Start     Dose/Rate Route Frequency Ordered Stop   09/02/16 1200  vancomycin (VANCOCIN) IVPB 1000 mg/200 mL premix     1,000 mg 200 mL/hr over 60 Minutes Intravenous Every 12 hours 09/02/16 1029     08/31/16 1315  amoxicillin-clavulanate (AUGMENTIN) 875-125 MG per tablet 1 tablet  Status:  Discontinued     1 tablet Oral Every 12 hours 08/31/16 1311 08/31/16 1901   08/31/16 0000  amoxicillin-clavulanate (AUGMENTIN) 875-125 MG tablet     1 tablet Oral Every 12 hours 08/31/16 1319     08/30/16 1800  vancomycin (VANCOCIN) IVPB 750 mg/150 ml premix  Status:  Discontinued     750 mg 150 mL/hr over 60 Minutes Intravenous  Every 8 hours 08/30/16 1615 09/02/16 1029   08/30/16 1600  vancomycin (VANCOCIN) 1,250 mg in sodium chloride 0.9 % 250 mL IVPB  Status:  Discontinued     1,250 mg 166.7 mL/hr over 90 Minutes Intravenous Every 8 hours 08/30/16 1555 08/30/16 1603   08/30/16 0000  amoxicillin-clavulanate (AUGMENTIN) 875-125 MG tablet  Status:  Discontinued     1 tablet Oral 2 times daily 08/30/16 1044 08/31/16    08/30/16 0000  doxycycline (VIBRAMYCIN) 50 MG capsule     100 mg Oral 2 times daily 08/30/16 1044     08/28/16 1230  piperacillin-tazobactam (ZOSYN) IVPB 3.375 g     3.375 g 100 mL/hr over 30 Minutes Intravenous  Once 08/28/16 1231 08/28/16 1302   08/27/16 0800  vancomycin (VANCOCIN) 1,250 mg in sodium chloride 0.9 % 250 mL IVPB  Status:  Discontinued     1,250 mg 166.7 mL/hr over 90 Minutes Intravenous Every 8 hours 08/27/16 0751 08/30/16 1000   08/26/16 0100  piperacillin-tazobactam (ZOSYN) IVPB 3.375 g     3.375 g 12.5 mL/hr over 240 Minutes Intravenous Every 8 hours 08/25/16 1652     08/25/16 2300  vancomycin (VANCOCIN) IVPB 1000 mg/200 mL premix  Status:  Discontinued     1,000 mg 200  mL/hr over 60 Minutes Intravenous Every 8 hours 08/25/16 2129 08/27/16 0751   08/25/16 2200  vancomycin (VANCOCIN) IVPB 1000 mg/200 mL premix  Status:  Discontinued     1,000 mg 200 mL/hr over 60 Minutes Intravenous Every 12 hours 08/25/16 1652 08/25/16 2056   08/25/16 1545  piperacillin-tazobactam (ZOSYN) IVPB 3.375 g     3.375 g 100 mL/hr over 30 Minutes Intravenous  Once 08/25/16 1530 08/25/16 1710   08/25/16 1545  vancomycin (VANCOCIN) IVPB 1000 mg/200 mL premix     1,000 mg 200 mL/hr over 60 Minutes Intravenous  Once 08/25/16 1530 08/25/16 1818      Assessment/Plan: s/p Procedure(s): AMPUTATION DIGIT 2nd toe (Left) Assessment: Osteomyelitis left hallux   Plan: Betadine and a sterile bandage applied to the second toe amputation sites as well as the left hallux ulceration. Discussed with the patient  that at this point we will need to go ahead and proceed with amputation of her left great toe. We will obtain a consent form for amputation of the left great toe. Nothing by mouth after midnight. Plan for surgery tomorrow afternoon  LOS: 9 days    Ricci Barker 09/03/2016

## 2016-09-04 ENCOUNTER — Encounter
Admission: EM | Disposition: A | Discharge status: Home / Self Care | Payer: Self-pay | Source: Home / Self Care | Attending: Internal Medicine

## 2016-09-04 ENCOUNTER — Inpatient Hospital Stay: Payer: Medicaid Other | Admitting: Anesthesiology

## 2016-09-04 ENCOUNTER — Encounter: Payer: Self-pay | Admitting: Certified Registered Nurse Anesthetist

## 2016-09-04 HISTORY — PX: AMPUTATION TOE: SHX6595

## 2016-09-04 LAB — AEROBIC/ANAEROBIC CULTURE (SURGICAL/DEEP WOUND)

## 2016-09-04 LAB — GLUCOSE, CAPILLARY
GLUCOSE-CAPILLARY: 92 mg/dL (ref 65–99)
GLUCOSE-CAPILLARY: 89 mg/dL (ref 65–99)
Glucose-Capillary: 122 mg/dL — ABNORMAL HIGH (ref 65–99)
Glucose-Capillary: 246 mg/dL — ABNORMAL HIGH (ref 65–99)
Glucose-Capillary: 173 mg/dL — ABNORMAL HIGH (ref 65–99)

## 2016-09-04 LAB — AEROBIC/ANAEROBIC CULTURE W GRAM STAIN (SURGICAL/DEEP WOUND)

## 2016-09-04 LAB — CREATININE, SERUM
Creatinine, Ser: 0.74 mg/dL (ref 0.44–1.00)
GFR calc non Af Amer: >60 mL/min (ref >60)

## 2016-09-04 LAB — VANCOMYCIN, TROUGH
VANCOMYCIN TR: 14 ug/mL — AB (ref 15–20)
Vancomycin Tr: 24 ug/mL (ref 15–20)

## 2016-09-04 SURGERY — AMPUTATION, TOE
Anesthesia: General | Laterality: Left | Wound class: Dirty or Infected

## 2016-09-04 MED ORDER — PROPOFOL 500 MG/50ML IV EMUL
INTRAVENOUS | Status: AC
Start: 2016-09-04 — End: 2016-09-04
  Filled 2016-09-04: qty 50

## 2016-09-04 MED ORDER — FENTANYL CITRATE (PF) 100 MCG/2ML IJ SOLN: 25.0000 ug | INTRAMUSCULAR | Status: DC | PRN

## 2016-09-04 MED ORDER — SODIUM CHLORIDE 0.9 % IV SOLN
INTRAVENOUS | Status: DC | PRN
  Administered 2016-09-04: 12:00:00 via INTRAVENOUS

## 2016-09-04 MED ORDER — BUPIVACAINE HCL (PF) 0.5 % IJ SOLN
INTRAMUSCULAR | Status: AC
  Filled 2016-09-04: qty 30

## 2016-09-04 MED ORDER — PROMETHAZINE HCL 25 MG/ML IJ SOLN: 6.2500 mg | INTRAMUSCULAR | Status: DC | PRN

## 2016-09-04 MED ORDER — MIDAZOLAM HCL 2 MG/2ML IJ SOLN
INTRAMUSCULAR | Status: DC | PRN
Start: 2016-09-04 — End: 2016-09-04
  Administered 2016-09-04: 2 mg via INTRAVENOUS

## 2016-09-04 MED ORDER — PROPOFOL 500 MG/50ML IV EMUL
INTRAVENOUS | Status: DC | PRN
  Administered 2016-09-04: 100 ug/kg/min via INTRAVENOUS

## 2016-09-04 MED ORDER — FENTANYL CITRATE (PF) 100 MCG/2ML IJ SOLN
INTRAMUSCULAR | Status: DC | PRN
  Administered 2016-09-04 (×4): 25 ug via INTRAVENOUS

## 2016-09-04 MED ORDER — NEOMYCIN-POLYMYXIN B GU 40-200000 IR SOLN
Status: DC | PRN
  Administered 2016-09-04: 2 mL

## 2016-09-04 MED ORDER — OXYCODONE HCL 5 MG/5ML PO SOLN: 5.0000 mg | Freq: Once | ORAL | Status: DC | PRN

## 2016-09-04 MED ORDER — MIDAZOLAM HCL 2 MG/2ML IJ SOLN
INTRAMUSCULAR | Status: AC
  Filled 2016-09-04: qty 2

## 2016-09-04 MED ORDER — MEPERIDINE HCL 50 MG/ML IJ SOLN: 6.2500 mg | INTRAMUSCULAR | Status: DC | PRN

## 2016-09-04 MED ORDER — NEOMYCIN-POLYMYXIN B GU 40-200000 IR SOLN
Status: AC
  Filled 2016-09-04: qty 1

## 2016-09-04 MED ORDER — FENTANYL CITRATE (PF) 100 MCG/2ML IJ SOLN
INTRAMUSCULAR | Status: AC
  Filled 2016-09-04: qty 2

## 2016-09-04 MED ORDER — ONDANSETRON HCL 4 MG/2ML IJ SOLN
INTRAMUSCULAR | Status: DC | PRN
  Administered 2016-09-04: 4 mg via INTRAVENOUS

## 2016-09-04 MED ORDER — LIDOCAINE HCL (PF) 1 % IJ SOLN
INTRAMUSCULAR | Status: AC
  Filled 2016-09-04: qty 30

## 2016-09-04 MED ORDER — BUPIVACAINE HCL (PF) 0.5 % IJ SOLN
INTRAMUSCULAR | Status: DC | PRN
  Administered 2016-09-04: 10 mL

## 2016-09-04 MED ORDER — PROPOFOL 10 MG/ML IV BOLUS
INTRAVENOUS | Status: DC | PRN
  Administered 2016-09-04: 20 mg via INTRAVENOUS

## 2016-09-04 MED ORDER — LIDOCAINE HCL (PF) 2 % IJ SOLN
INTRAMUSCULAR | Status: AC
  Filled 2016-09-04: qty 2

## 2016-09-04 MED ORDER — ONDANSETRON HCL 4 MG/2ML IJ SOLN
INTRAMUSCULAR | Status: AC
  Filled 2016-09-04: qty 2

## 2016-09-04 MED ORDER — OXYCODONE HCL 5 MG PO TABS: 5.0000 mg | ORAL_TABLET | Freq: Once | ORAL | Status: DC | PRN

## 2016-09-04 SURGICAL SUPPLY — 49 items
BANDAGE ACE 4X5 VEL STRL LF (GAUZE/BANDAGES/DRESSINGS) ×3 IMPLANT
BANDAGE STRETCH 3X4.1 STRL (GAUZE/BANDAGES/DRESSINGS) ×3 IMPLANT
BLADE MED AGGRESSIVE (BLADE) ×3 IMPLANT
BLADE OSC/SAGITTAL MD 5.5X18 (BLADE) IMPLANT
BLADE SURG 15 STRL LF DISP TIS (BLADE) ×2 IMPLANT
BLADE SURG 15 STRL SS (BLADE) ×4
BLADE SURG MINI STRL (BLADE) ×3 IMPLANT
BNDG ESMARK 4X12 TAN STRL LF (GAUZE/BANDAGES/DRESSINGS) ×3 IMPLANT
BNDG GAUZE 4.5X4.1 6PLY STRL (MISCELLANEOUS) ×3 IMPLANT
CANISTER SUCT 1200ML W/VALVE (MISCELLANEOUS) ×3 IMPLANT
CLOSURE WOUND 1/4X4 (GAUZE/BANDAGES/DRESSINGS) ×1
CNTNR SPEC 2.5X3XGRAD LEK (MISCELLANEOUS) ×1
CONT SPEC 4OZ STER OR WHT (MISCELLANEOUS) ×2
CONTAINER SPEC 2.5X3XGRAD LEK (MISCELLANEOUS) ×1 IMPLANT
CUFF TOURN 18 STER (MISCELLANEOUS) ×3 IMPLANT
CUFF TOURN DUAL PL 12 NO SLV (MISCELLANEOUS) IMPLANT
DRAPE FLUOR MINI C-ARM 54X84 (DRAPES) IMPLANT
DURAPREP 26ML APPLICATOR (WOUND CARE) ×3 IMPLANT
ELECT REM PT RETURN 9FT ADLT (ELECTROSURGICAL) ×3
ELECTRODE REM PT RTRN 9FT ADLT (ELECTROSURGICAL) ×1 IMPLANT
GAUZE PETRO XEROFOAM 1X8 (MISCELLANEOUS) ×3 IMPLANT
GAUZE SPONGE 4X4 12PLY STRL (GAUZE/BANDAGES/DRESSINGS) ×3 IMPLANT
GAUZE STRETCH 2X75IN STRL (MISCELLANEOUS) ×3 IMPLANT
GLOVE BIO SURGEON STRL SZ7.5 (GLOVE) ×3 IMPLANT
GLOVE INDICATOR 8.0 STRL GRN (GLOVE) ×3 IMPLANT
GOWN STRL REUS W/ TWL LRG LVL3 (GOWN DISPOSABLE) ×3 IMPLANT
GOWN STRL REUS W/TWL LRG LVL3 (GOWN DISPOSABLE) ×6
HANDPIECE VERSAJET DEBRIDEMENT (MISCELLANEOUS) IMPLANT
KIT RM TURNOVER STRD PROC AR (KITS) ×3 IMPLANT
LABEL OR SOLS (LABEL) ×3 IMPLANT
NEEDLE FILTER BLUNT 18X 1/2SAF (NEEDLE) ×2
NEEDLE FILTER BLUNT 18X1 1/2 (NEEDLE) ×1 IMPLANT
NEEDLE HYPO 25X1 1.5 SAFETY (NEEDLE) ×6 IMPLANT
NS IRRIG 500ML POUR BTL (IV SOLUTION) ×3 IMPLANT
PACK EXTREMITY ARMC (MISCELLANEOUS) ×3 IMPLANT
SOL .9 NS 3000ML IRR  AL (IV SOLUTION)
SOL .9 NS 3000ML IRR UROMATIC (IV SOLUTION) IMPLANT
SOL PREP PVP 2OZ (MISCELLANEOUS) ×3
SOLUTION PREP PVP 2OZ (MISCELLANEOUS) ×1 IMPLANT
STOCKINETTE STRL 6IN 960660 (GAUZE/BANDAGES/DRESSINGS) ×3 IMPLANT
STRIP CLOSURE SKIN 1/4X4 (GAUZE/BANDAGES/DRESSINGS) ×2 IMPLANT
SUT ETHILON 3-0 FS-10 30 BLK (SUTURE) ×3
SUT ETHILON 4-0 (SUTURE) ×2
SUT ETHILON 4-0 FS2 18XMFL BLK (SUTURE) ×1
SUTURE EHLN 3-0 FS-10 30 BLK (SUTURE) ×1 IMPLANT
SUTURE ETHLN 4-0 FS2 18XMF BLK (SUTURE) ×1 IMPLANT
SWAB CULTURE AMIES ANAERIB BLU (MISCELLANEOUS) IMPLANT
SWAB DUAL CULTURE TRANS RED ST (MISCELLANEOUS) IMPLANT
SYRINGE 10CC LL (SYRINGE) ×3 IMPLANT

## 2016-09-04 NOTE — Anesthesia Postprocedure Evaluation (Signed)
Anesthesia Post Note  Patient: Mary Mcdonald  Procedure(s) Performed: Procedure(s) (LRB): AMPUTATION TOE (Left)  Patient location during evaluation: PACU Anesthesia Type: General Level of consciousness: awake and alert and oriented Pain management: pain level controlled Vital Signs Assessment: post-procedure vital signs reviewed and stable Respiratory status: spontaneous breathing, nonlabored ventilation and respiratory function stable Cardiovascular status: blood pressure returned to baseline and stable Postop Assessment: no signs of nausea or vomiting Anesthetic complications: no     Last Vitals:  Vitals:   09/04/16 1340 09/04/16 1401  BP: 132/82 136/76  Pulse: 70 77  Resp: 14 18  Temp: 36.2 C 36.5 C    Last Pain:  Vitals:   09/04/16 1401  TempSrc: Oral  PainSc:                  Lataunya Ruud

## 2016-09-04 NOTE — H&P (View-Only) (Signed)
6 Days Post-Op  Subjective: Patient seen. No complaints of pain  Objective: Vital signs in last 24 hours: Temp:  [97.7 F (36.5 C)-98.2 F (36.8 C)] 97.9 F (36.6 C) (04/16 0800) Pulse Rate:  [73-86] 75 (04/16 0800) Resp:  [18] 18 (04/16 0800) BP: (134-187)/(68-99) 167/99 (04/16 0800) SpO2:  [99 %-100 %] 100 % (04/16 0800) Last BM Date: 09/02/16  Intake/Output from previous day: 04/15 0701 - 04/16 0700 In: 1990 [P.O.:1440; IV Piggyback:550] Out: -  Intake/Output this shift: No intake/output data recorded.  The second toe amputation site is healing well with no drainage. There is still some mild drainage from the ulceration on the left hallux. Upon removal of the packing a moderate amount of purulence is easily expressed through the base of the wound today.  Lab Results:   Recent Labs  09/03/16 0351  WBC 7.0  HGB 10.8*  HCT 32.7*  PLT 284   BMET  Recent Labs  09/02/16 0923 09/03/16 0351  NA  --  137  K  --  3.8  CL  --  102  CO2  --  29  GLUCOSE  --  267*  BUN  --  11  CREATININE 0.81 0.78  CALCIUM  --  9.3   PT/INR No results for input(s): LABPROT, INR in the last 72 hours. ABG No results for input(s): PHART, HCO3 in the last 72 hours.  Invalid input(s): PCO2, PO2  Studies/Results: No results found.  Anti-infectives: Anti-infectives    Start     Dose/Rate Route Frequency Ordered Stop   09/02/16 1200  vancomycin (VANCOCIN) IVPB 1000 mg/200 mL premix     1,000 mg 200 mL/hr over 60 Minutes Intravenous Every 12 hours 09/02/16 1029     08/31/16 1315  amoxicillin-clavulanate (AUGMENTIN) 875-125 MG per tablet 1 tablet  Status:  Discontinued     1 tablet Oral Every 12 hours 08/31/16 1311 08/31/16 1901   08/31/16 0000  amoxicillin-clavulanate (AUGMENTIN) 875-125 MG tablet     1 tablet Oral Every 12 hours 08/31/16 1319     08/30/16 1800  vancomycin (VANCOCIN) IVPB 750 mg/150 ml premix  Status:  Discontinued     750 mg 150 mL/hr over 60 Minutes Intravenous  Every 8 hours 08/30/16 1615 09/02/16 1029   08/30/16 1600  vancomycin (VANCOCIN) 1,250 mg in sodium chloride 0.9 % 250 mL IVPB  Status:  Discontinued     1,250 mg 166.7 mL/hr over 90 Minutes Intravenous Every 8 hours 08/30/16 1555 08/30/16 1603   08/30/16 0000  amoxicillin-clavulanate (AUGMENTIN) 875-125 MG tablet  Status:  Discontinued     1 tablet Oral 2 times daily 08/30/16 1044 08/31/16    08/30/16 0000  doxycycline (VIBRAMYCIN) 50 MG capsule     100 mg Oral 2 times daily 08/30/16 1044     08/28/16 1230  piperacillin-tazobactam (ZOSYN) IVPB 3.375 g     3.375 g 100 mL/hr over 30 Minutes Intravenous  Once 08/28/16 1231 08/28/16 1302   08/27/16 0800  vancomycin (VANCOCIN) 1,250 mg in sodium chloride 0.9 % 250 mL IVPB  Status:  Discontinued     1,250 mg 166.7 mL/hr over 90 Minutes Intravenous Every 8 hours 08/27/16 0751 08/30/16 1000   08/26/16 0100  piperacillin-tazobactam (ZOSYN) IVPB 3.375 g     3.375 g 12.5 mL/hr over 240 Minutes Intravenous Every 8 hours 08/25/16 1652     08/25/16 2300  vancomycin (VANCOCIN) IVPB 1000 mg/200 mL premix  Status:  Discontinued     1,000 mg 200  mL/hr over 60 Minutes Intravenous Every 8 hours 08/25/16 2129 08/27/16 0751   08/25/16 2200  vancomycin (VANCOCIN) IVPB 1000 mg/200 mL premix  Status:  Discontinued     1,000 mg 200 mL/hr over 60 Minutes Intravenous Every 12 hours 08/25/16 1652 08/25/16 2056   08/25/16 1545  piperacillin-tazobactam (ZOSYN) IVPB 3.375 g     3.375 g 100 mL/hr over 30 Minutes Intravenous  Once 08/25/16 1530 08/25/16 1710   08/25/16 1545  vancomycin (VANCOCIN) IVPB 1000 mg/200 mL premix     1,000 mg 200 mL/hr over 60 Minutes Intravenous  Once 08/25/16 1530 08/25/16 1818      Assessment/Plan: s/p Procedure(s): AMPUTATION DIGIT 2nd toe (Left) Assessment: Osteomyelitis left hallux   Plan: Betadine and a sterile bandage applied to the second toe amputation sites as well as the left hallux ulceration. Discussed with the patient  that at this point we will need to go ahead and proceed with amputation of her left great toe. We will obtain a consent form for amputation of the left great toe. Nothing by mouth after midnight. Plan for surgery tomorrow afternoon  LOS: 9 days    Ricci Barker 09/03/2016

## 2016-09-04 NOTE — Op Note (Signed)
Date of operation: 09/04/2016.  Surgeon: Durward Fortes DPM.  Preoperative diagnosis: Osteomyelitis left great toe.  Postoperative diagnosis: Same.  Procedure: Amputation left great toe.  Anesthesia: Local Mac.  Hemostasis: Pneumatic tourniquet left ankle 250 mmHg.  Estimated blood loss: Less than 5 cc.  Pathology: Left great toe.  Cultures: Bone culture left great toe.  Implants: None.  Complications: None apparent.  Operative indications: 51 year old female admitted last week with infection in her toes. MRI showed osteomyelitis suspected in the first and second toes. Clinically only the second was significantly infected. This was amputated last week. Subsequent evaluation of the left great toe ulcer eventually revealed significant purulent drainage noted yesterday and decision was made for amputation of the left great toe as well. Her graft operative procedure: Patient was taken to the operating room and placed on the table in the supine position. Following satisfactory sedation the left foot was anesthetized with 10 cc of 0.5% bupivacaine plain around the first metatarsal. A pneumatic tourniquet was applied at the level of the left ankle and the foot was prepped and draped in the usual sterile fashion. Foot was exsanguinated and the tourniquet inflated to 250 mmHg.   Attention was then directed to the left great toe where an elliptical fishmouth type incision was made coursing medial to lateral around the base of the toe. The incision was carried sharply down to the level of the bone and periosteal dissection carried back to the proximal aspect of the proximal phalanx where the toe was transected using a sagittal saw and the toe removed in toto with care taken to leave the metatarsophalangeal joint intact. Good healthy tissues were noted around the wound. The wound was flushed with copious amounts of sterile saline and the incision was then closed using 4-0 nylon vertical mattress and  simple interrupted sutures. Xeroform and sterile gauze bandage is applied to the left foot. Tourniquet was released and blood flow noted to return to the left foot and digits. Patient tolerated the procedure and anesthesia well and was transported to the PACU with vital signs stable and in good condition.

## 2016-09-04 NOTE — Progress Notes (Signed)
Nutrition Brief Note  Patient identified for length of stay greater than or equal to 10 days  Wt Readings from Last 15 Encounters:  09/04/16 154 lb (69.9 kg)    Body mass index is 21.48 kg/m. Patient meets criteria for normal based on current BMI.  Reports stable weight with no weight loss, good appetite & PO intake.  Current diet order is carb mod, patient is consuming approximately 90-100% of meals at this time. Labs and medications reviewed.   No nutrition interventions warranted at this time. If nutrition issues arise, please consult RD.   Mary Mcdonald. Mary Schappert, MS, RD LDN Inpatient Clinical Dietitian Pager [phone]

## 2016-09-04 NOTE — Anesthesia Post-op Follow-up Note (Cosign Needed)
Anesthesia QCDR form completed.        

## 2016-09-04 NOTE — Interval H&P Note (Signed)
History and Physical Interval Note:  09/04/2016 11:17 AM  Mary Mcdonald  has presented today for surgery, with the diagnosis of Infected left great toe  The various methods of treatment have been discussed with the patient and family. After consideration of risks, benefits and other options for treatment, the patient has consented to  Procedure(s): AMPUTATION TOE (Left) as a surgical intervention .  The patient's history has been reviewed, patient examined, no change in status, stable for surgery.  I have reviewed the patient's chart and labs.  Questions were answered to the patient's satisfaction.     Ricci Barker

## 2016-09-04 NOTE — Progress Notes (Signed)
ANTIBIOTIC CONSULT NOTE   Pharmacy Consult for Vancomycin, Zosyn  Indication: cellulitis, possible osteomyelitis      Allergies  Allergen Reactions  . Lithium Rash    Patient Measurements: Height:  (175.3 cm) Weight: 150 lb 4.8 oz (68.2 kg) IBW/kg (Calculated) : 66.2 Adjusted Body Weight: 67 kg   Vital Signs: Temp: 98.8 F (37.1 C) (04/08 2354) Temp Source: Oral (04/08 2354) BP: 148/72 (04/08 2354) Pulse Rate: 78 (04/08 2354) Intake/Output from previous day: 04/08 0701 - 04/09 0700 In: 840 [P.O.:840] Out: -  Intake/Output from this shift: No intake/output data recorded.  Labs:  Recent Labs (last 2 labs)    Recent Labs  08/25/16 1436 08/26/16 0457  WBC 11.4* 7.3  HGB 11.4* 11.2*  PLT 371 328  CREATININE 0.52 0.44     Estimated Creatinine Clearance: 87.9 mL/min (by C-G formula based on SCr of 0.44 mg/dL).  Recent Labs (last 2 labs)    Recent Labs  08/27/16 0631  VANCOTROUGH 13*      Microbiology:       Recent Results (from the past 720 hour(s))  Blood Culture (routine x 2) Status: None (Preliminary result)   Collection Time: 08/25/16 4:04 PM  Result Value Ref Range Status   Specimen Description BLOOD L AC  Final   Special Requests BOTTLES DRAWN AEROBIC AND ANAEROBIC BCAV  Final   Culture NO GROWTH < 24 HOURS  Final   Report Status PENDING  Incomplete  Blood Culture (routine x 2) Status: None (Preliminary result)   Collection Time: 08/25/16 4:04 PM  Result Value Ref Range Status   Specimen Description BLOOD R HAND  Final   Special Requests BOTTLES DRAWN AEROBIC AND ANAEROBIC BCAV  Final   Culture NO GROWTH < 24 HOURS  Final   Report Status PENDING  Incomplete    Medical History:     Past Medical History:  Diagnosis Date  . Bipolar 1 disorder (HCC)     Medications:  No prescriptions prior to admission.   Assessment: CrCl = 110 ml/min Ke = 0.096 hr-1 T1/2 = 7.2 hrs Vd  = 47.7 L   Goal of Therapy:  Vancomycin trough level 15-20 mcg/ml  Plan: Expected duration 7 days with resolution of temperature and/or normalization of WBC  Zosyn 3.375 gm IV X 1 given on 4/7 @ 16:40. Zosyn 3.375 gm IV Q8H EI ordered to start on 4/8 @ 22:00.  Vancomycin 1 gm IV X 1 given on 4/7 @ 17:00.  Vancomycin 1 mg IV Q8H ordered to start on 4/8 @ 0100, 6 hrs after 1st dose (stacked dosing). This pt will reach Css by 4/9 @ 0500. Will draw 1st trough on 4/9 @ 0630, which will be at Css.   Vancomycin trough 13 mcg/mL before sixth dose. MRI today for OM eval - will increase dose to 1.25 gm IV Q8H and recheck trough tomorrow morning. If MRI shows cellulitis we can likely stay at 1 gm IV Q8H but until OM r/o will increase to 1.25 gm IV Q8H.   4/10 @ 0500 VT 24 was drawn 5 hours too early. Will redraw VT today @ 1500 to reassess.  4/10 @ 1726 16. Will continue with current dose of Vancomycin  IV every 8 hours. Will redraw VT after 5 doses to monitor.   4/12 @ 0130 VT 23. Level was drawn probably as the dose was infusing, will redraw another trough @ 0900 and reassess. No BMP in 4 days, will recheck BMP w/ am  labs to assess renal function, UOP not documented.  4/12: VT @ ~08:58 was 26, likely drawn while drug infusing. Will recheck trough level .  4/12 Vanc level 15 . According to nurse patient never received Am dose even though MAR record show trough was drawn after start of AM vanc dose.  Based on two levels drawn today, new Ke: 0.092  and t1/2 ~8 hours. Will adjust dose to Vancomycin  IV every 8 hours. Will recheck trough level 4/13 prior to 4th dose.  4/13 VT 17 . Will continue current regimen of  IV every 8 hours. Patient scheduled for D/C on oral Doxy and Augmentin. Will F/U renal function in two days if not D/Cd  4/15: Vanc Trough= 21 mcg/ml at 0921. Will transition to Vancomycin 1 gram IV q12h.  Cellulitis of L foot as well as osteomyelitis  of 2nd toe and possibly 1st toe per ID note. CX= MRSA. See ID note 08/31/16.  4/17: Vanc trough at 1114= 24 mcg/ml. Noted that Vancomycin was hung at 1110 per eMAR. Vancomycin level appears to have been drawn while Vanc running? Will recheck trough prior to next dose with Scr. Patient also continues on Zosyn 3.375gm EI q8h. f/u renal fxn.  Angelique Blonder, PharmD, BCPS Clinical Pharmacist 09/04/2016 12:16 PM

## 2016-09-04 NOTE — OR Nursing (Signed)
upreg neg 08/26/16 - Do not need to repeat per Dr. Priscella , ascom @ 1135 am

## 2016-09-04 NOTE — Progress Notes (Signed)
Patient returned to the floor from  OR, patient alert and awake, denies any apin at this time, vss,  Dressing dry and intact , no drainage noted. Will continue to monitor

## 2016-09-04 NOTE — Transfer of Care (Signed)
Immediate Anesthesia Transfer of Care Note  Patient: Mary Mcdonald  Procedure(s) Performed: Procedure(s): AMPUTATION TOE (Left)  Patient Location: PACU  Anesthesia Type:General  Level of Consciousness: awake  Airway & Oxygen Therapy: Patient Spontanous Breathing and Patient connected to face mask oxygen  Post-op Assessment: Report given to RN and Post -op Vital signs reviewed and stable  Post vital signs: Reviewed and stable  Last Vitals:  Vitals:   09/04/16 1309 09/04/16 1310  BP: 130/72 130/72  Pulse: 77 75  Resp: 12 15  Temp: 36.2 C 36.2 C    Last Pain:  Vitals:   09/04/16 1309  TempSrc: Temporal  PainSc:       Patients Stated Pain Goal: 2 (08/30/16 0204)  Complications: No apparent anesthesia complications

## 2016-09-04 NOTE — Progress Notes (Signed)
Patient presently resting in the bed, alert and oriented, sitter at bedside, mood calm, patient scheduled for surgery today , NPO since midnight , no distress noted

## 2016-09-04 NOTE — Anesthesia Procedure Notes (Signed)
Date/Time: 09/04/2016 12:20 PM Performed by: Ginger Carne Pre-anesthesia Checklist: Patient identified, Emergency Drugs available, Suction available, Patient being monitored and Timeout performed Patient Re-evaluated:Patient Re-evaluated prior to inductionOxygen Delivery Method: Nasal cannula

## 2016-09-04 NOTE — Progress Notes (Signed)
This writer assumed care of pt at 1500. Pt continues with 1:1 sitter, pt's l leg is wrapped inace wrap and elevated on two pillows. Pt requested and received pain medication, stated that operative site did not hurt, but the arch in the foot did. Pt is able to verbalize to this writer that she needs to go to psych unit after she is done getting her foot treated, stated that she knows she needs longer time on meds to stabilize, and that Dr. In Koleen Nimrod changed her meds and destabilized her. Pt in no distress.

## 2016-09-05 LAB — GLUCOSE, CAPILLARY
Glucose-Capillary: 200 mg/dL — ABNORMAL HIGH (ref 65–99)
Glucose-Capillary: 296 mg/dL — ABNORMAL HIGH (ref 65–99)
Glucose-Capillary: 152 mg/dL — ABNORMAL HIGH (ref 65–99)

## 2016-09-05 LAB — CREATININE, SERUM
Creatinine, Ser: 0.63 mg/dL (ref 0.44–1.00)
GFR calc non Af Amer: >60 mL/min (ref >60)

## 2016-09-05 MED ORDER — DIVALPROEX SODIUM 500 MG PO DR TAB
750.0000 mg | DELAYED_RELEASE_TABLET | Freq: Two times a day (BID) | ORAL | Status: DC
  Administered 2016-09-05 – 2016-09-11 (×12): 750 mg via ORAL
  Filled 2016-09-05 (×2): qty 1
  Filled 2016-09-05: qty 2
  Filled 2016-09-05 (×12): qty 1

## 2016-09-05 MED ORDER — VANCOMYCIN HCL 10 G IV SOLR
1250.0000 mg | Freq: Two times a day (BID) | INTRAVENOUS | Status: DC
  Administered 2016-09-05 – 2016-09-09 (×9): 1250 mg via INTRAVENOUS
  Filled 2016-09-05 (×11): qty 1250

## 2016-09-05 NOTE — Progress Notes (Signed)
ANTIBIOTIC CONSULT NOTE   Pharmacy Consult for Vancomycin, Zosyn  Indication: cellulitis, possible osteomyelitis      Allergies  Allergen Reactions  . Lithium Rash    Patient Measurements: Height:  (175.3 cm) Weight: 150 lb 4.8 oz (68.2 kg) IBW/kg (Calculated) : 66.2 Adjusted Body Weight: 67 kg   Vital Signs: Temp: 98.8 F (37.1 C) (04/08 2354) Temp Source: Oral (04/08 2354) BP: 148/72 (04/08 2354) Pulse Rate: 78 (04/08 2354) Intake/Output from previous day: 04/08 0701 - 04/09 0700 In: 840 [P.O.:840] Out: -  Intake/Output from this shift: No intake/output data recorded.  Labs:  Recent Labs (last 2 labs)    Recent Labs  08/25/16 1436 08/26/16 0457  WBC 11.4* 7.3  HGB 11.4* 11.2*  PLT 371 328  CREATININE 0.52 0.44     Estimated Creatinine Clearance: 87.9 mL/min (by C-G formula based on SCr of 0.44 mg/dL).  Recent Labs (last 2 labs)    Recent Labs  08/27/16 0631  VANCOTROUGH 13*      Microbiology:       Recent Results (from the past 720 hour(s))  Blood Culture (routine x 2) Status: None (Preliminary result)   Collection Time: 08/25/16 4:04 PM  Result Value Ref Range Status   Specimen Description BLOOD L AC  Final   Special Requests BOTTLES DRAWN AEROBIC AND ANAEROBIC BCAV  Final   Culture NO GROWTH < 24 HOURS  Final   Report Status PENDING  Incomplete  Blood Culture (routine x 2) Status: None (Preliminary result)   Collection Time: 08/25/16 4:04 PM  Result Value Ref Range Status   Specimen Description BLOOD R HAND  Final   Special Requests BOTTLES DRAWN AEROBIC AND ANAEROBIC BCAV  Final   Culture NO GROWTH < 24 HOURS  Final   Report Status PENDING  Incomplete    Medical History:     Past Medical History:  Diagnosis Date  . Bipolar 1 disorder (HCC)     Medications:  No prescriptions prior to admission.   Assessment: CrCl = 110 ml/min Ke = 0.096 hr-1 T1/2 = 7.2 hrs Vd  = 47.7 L   Goal of Therapy:  Vancomycin trough level 15-20 mcg/ml  Plan: Expected duration 7 days with resolution of temperature and/or normalization of WBC  Zosyn 3.375 gm IV X 1 given on 4/7 @ 16:40. Zosyn 3.375 gm IV Q8H EI ordered to start on 4/8 @ 22:00.  Vancomycin 1 gm IV X 1 given on 4/7 @ 17:00.  Vancomycin 1 mg IV Q8H ordered to start on 4/8 @ 0100, 6 hrs after 1st dose (stacked dosing). This pt will reach Css by 4/9 @ 0500. Will draw 1st trough on 4/9 @ 0630, which will be at Css.   Vancomycin trough 13 mcg/mL before sixth dose. MRI today for OM eval - will increase dose to 1.25 gm IV Q8H and recheck trough tomorrow morning. If MRI shows cellulitis we can likely stay at 1 gm IV Q8H but until OM r/o will increase to 1.25 gm IV Q8H.   4/10 @ 0500 VT 24 was drawn 5 hours too early. Will redraw VT today @ 1500 to reassess.  4/10 @ 1726 16. Will continue with current dose of Vancomycin  IV every 8 hours. Will redraw VT after 5 doses to monitor.   4/12 @ 0130 VT 23. Level was drawn probably as the dose was infusing, will redraw another trough @ 0900 and reassess. No BMP in 4 days, will recheck BMP w/ am  labs to assess renal function, UOP not documented.  4/12: VT @ ~08:58 was 26, likely drawn while drug infusing. Will recheck trough level .  4/12 Vanc level 15 . According to nurse patient never received Am dose even though MAR record show trough was drawn after start of AM vanc dose.  Based on two levels drawn today, new Ke: 0.092  and t1/2 ~8 hours. Will adjust dose to Vancomycin  IV every 8 hours. Will recheck trough level 4/13 prior to 4th dose.  4/13 VT 17 . Will continue current regimen of  IV every 8 hours. Patient scheduled for D/C on oral Doxy and Augmentin. Will F/U renal function in two days if not D/Cd  4/15: Vanc Trough= 21 mcg/ml at 0921. Will transition to Vancomycin 1 gram IV q12h.  Cellulitis of L foot as well as osteomyelitis  of 2nd toe and possibly 1st toe per ID note. CX= MRSA. See ID note 08/31/16.  4/17: Vanc trough at 1114= 24 mcg/ml. Noted that Vancomycin was hung at 1110 per eMAR. Vancomycin level appears to have been drawn while Vanc running? Will recheck trough prior to next dose with Scr. Patient also continues on Zosyn 3.375gm EI q8h. f/u renal fxn.  4/18 PM vanc level 14. Will change to 1250 mg q 12 hours. Recheck before 4th new dose.  Erich Montane, PharmD, BCPS Clinical Pharmacist 09/05/2016 12:37 AM

## 2016-09-05 NOTE — Progress Notes (Signed)
Patient ID: Mary Mcdonald, female   DOB: Sep 28, 1965, 51 y.o.   MRN: [id number]  [employer] PROGRESS NOTE  Mary Mcdonald YQM:[id number] DOB: [birth year] DOA: 08/25/2016 PCP: No PCP Per Patient  HPI/Subjective: Schedule for amputation of the left great toe by podiatry today.   Objective: Vitals:   09/05/16 0436 09/05/16 0841  BP: (!) 149/71 128/63  Pulse: 82 79  Resp: 18 16  Temp: 98.7 F (37.1 C) 98.5 F (36.9 C)    Filed Weights   08/25/16 1417 08/25/16 2053 09/04/16 1139  Weight: 72.6 kg (160 lb) 68.2 kg (150 lb 4.8 oz) 69.9 kg (154 lb)    ROS: Review of Systems  Constitutional: Negative for chills and fever.  Eyes: Negative for blurred vision.  Respiratory: Negative for cough and shortness of breath.   Cardiovascular: Negative for chest pain.  Gastrointestinal: Negative for abdominal pain, constipation, diarrhea, nausea and vomiting.  Genitourinary: Negative for dysuria.  Musculoskeletal: Positive for joint pain.  Neurological: Negative for dizziness and headaches.    Exam: Physical Exam  HENT:  Nose: No mucosal edema.  Mouth/Throat: No oropharyngeal exudate.  Eyes: Conjunctivae, EOM and lids are normal. Pupils are equal, round, and reactive to light.  Neck: Carotid bruit is not present. No thyromegaly present.  Cardiovascular: Regular rhythm, S1 normal, S2 normal and normal heart sounds.   Respiratory: She has no decreased breath sounds. She has no wheezes. She has no rhonchi. She has no rales.  GI: Soft. Bowel sounds are normal. There is no tenderness.  Musculoskeletal:       Right ankle: She exhibits no swelling.       Left ankle: She exhibits decreased range of motion. She exhibits no swelling.  Neurological: She is alert. No cranial nerve deficit.  Skin: Skin is warm. Nails show no clubbing.  Both feet covered with dressings by podiatry  Psychiatric: She has a normal mood and affect. Her speech is not rapid and/or pressured.   dressing present for left  foot,  Data Reviewed: Basic Metabolic Panel:  Recent Labs Lab 08/30/16 0508 09/01/16 0431 09/02/16 0923 09/03/16 0351 09/04/16 2318 09/05/16 0553  NA 139  --   --  137  --   --   K 3.3*  --   --  3.8  --   --   CL 109  --   --  102  --   --   CO2 26  --   --  29  --   --   GLUCOSE 231*  --   --  267*  --   --   BUN 15  --   --  11  --   --   CREATININE 0.76 0.71 0.81 0.78 0.74 0.63  CALCIUM 9.0  --   --  9.3  --   --    Liver Function Tests: No results for input(s): AST, ALT, ALKPHOS, BILITOT, PROT, ALBUMIN in the last 168 hours. CBC:  Recent Labs Lab 09/03/16 0351  WBC 7.0  HGB 10.8*  HCT 32.7*  MCV 80.4  PLT 284     Recent Results (from the past 240 hour(s))  Aerobic Culture (superficial specimen)     Status: None   Collection Time: 08/26/16  6:15 PM  Result Value Ref Range Status   Specimen Description FOOT LEFT  Final   Special Requests NONE  Final   Gram Stain   Final    FEW WBC PRESENT, PREDOMINANTLY PMN RARE GRAM POSITIVE COCCI IN PAIRS Performed  at [facility], [address], [place], [place] [address]    Culture   Final    RARE METHICILLIN RESISTANT STAPHYLOCOCCUS AUREUS MULTIPLE ORGANISMS PRESENT, NONE PREDOMINANT    Report Status 08/30/2016 FINAL  Final   Organism ID, Bacteria METHICILLIN RESISTANT STAPHYLOCOCCUS AUREUS  Final      Susceptibility   Methicillin resistant staphylococcus aureus - MIC*    CIPROFLOXACIN >=8 RESISTANT Resistant     ERYTHROMYCIN >=8 RESISTANT Resistant     GENTAMICIN <=0.5 SENSITIVE Sensitive     OXACILLIN >=4 RESISTANT Resistant     TETRACYCLINE <=1 SENSITIVE Sensitive     VANCOMYCIN 1 SENSITIVE Sensitive     TRIMETH/SULFA <=10 SENSITIVE Sensitive     CLINDAMYCIN <=0.25 SENSITIVE Sensitive     RIFAMPIN <=0.5 SENSITIVE Sensitive     Inducible Clindamycin NEGATIVE Sensitive     * RARE METHICILLIN RESISTANT STAPHYLOCOCCUS AUREUS  Aerobic/Anaerobic Culture (surgical/deep wound)     Status: None    Collection Time: 08/28/16  1:03 PM  Result Value Ref Range Status   Specimen Description TOE  Final   Special Requests SECOND TOE LEFT FOOT BONE CULTURE  Final   Gram Stain   Final    MODERATE WBC PRESENT,BOTH PMN AND MONONUCLEAR NO ORGANISMS SEEN    Culture   Final    RARE STAPHYLOCOCCUS SPECIES (COAGULASE NEGATIVE) CALL MICROBIOLOGY LAB IF SENSITIVITIES ARE REQUIRED. NO ANAEROBES ISOLATED Performed at [facility], [address] [place], [place] [address]    Report Status 09/04/2016 FINAL  Final  Aerobic Culture (superficial specimen)     Status: None   Collection Time: 08/31/16 12:22 PM  Result Value Ref Range Status   Specimen Description ULCER TOE left great  Final   Special Requests NONE  Final   Gram Stain   Final    FEW WBC PRESENT, PREDOMINANTLY PMN NO ORGANISMS SEEN    Culture   Final    NO GROWTH 2 DAYS Performed at [facility], [address] [place], [place] [address]    Report Status 09/03/2016 FINAL  Final  Aerobic/Anaerobic Culture (surgical/deep wound)     Status: None (Preliminary result)   Collection Time: 09/04/16 12:54 PM  Result Value Ref Range Status   Specimen Description BONE INFECTED LEFT GREAT TOE  Final   Special Requests NONE  Final   Gram Stain   Final    RARE WBC PRESENT, PREDOMINANTLY MONONUCLEAR NO ORGANISMS SEEN Performed at [facility], [address] [place], [place] [address]    Culture PENDING  Incomplete   Report Status PENDING  Incomplete     Studies: No results found.  Scheduled Meds: . amLODipine  10 mg Oral Daily  . clonazePAM  1 mg Oral QHS  . divalproex  500 mg Oral BID  . enoxaparin (LOVENOX) injection  40 mg Subcutaneous Q24H  . insulin aspart  0-5 Units Subcutaneous QHS  . insulin aspart  0-9 Units Subcutaneous TID WC  . levothyroxine  100 mcg Oral Q0600  . lisinopril  5 mg Oral Daily  . nicotine  21 mg Transdermal Daily  . QUEtiapine  400 mg Oral QHS     Assessment/Plan:  1. 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 2.  3. 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  4.  5. Type 2 diabetes mellitus. Good control hemoglobin A1c 6.7. On sliding scale.  6.  7. Essential hypertension on low-dose lisinopril, amlodipine. 8.  9. Hypothyroidism unspecified on levothyroxine 10.  Bipolar disorder; continue  Klonopin, Seroquel,depakote psych is following, continue IVC.  Code Status:     Code Status Orders        Start     Ordered   08/25/16 2057  Full code  Continuous     08/25/16 2056    Code Status History    Date Active Date Inactive Code Status Order ID Comments User Context   This patient has a current code status but no historical code status.      Disposition Plan;  Consultants:  Podiatry  Psychiatry  Antibiotics:  Vancomycin  Zosyn  Time spent: 22 minutes  [employer]

## 2016-09-05 NOTE — Progress Notes (Signed)
Patient ID: Mary Mcdonald, female   DOB: [birth year], 51 y.o.   MRN: [id number]  [employer] [facility] PROGRESS NOTE  Mary Mcdonald NWG:[id number] DOB: 10/30/1965 DOA: 08/25/2016 PCP: No PCP Per Patient  HPI/Subjective: Status post amputation of left great toe yesterday.  Objective: Vitals:   09/05/16 0436 09/05/16 0841  BP: (!) 149/71 128/63  Pulse: 82 79  Resp: 18 16  Temp: 98.7 F (37.1 C) 98.5 F (36.9 C)    Filed Weights   08/25/16 1417 08/25/16 2053 09/04/16 1139  Weight: 72.6 kg (160 lb) 68.2 kg (150 lb 4.8 oz) 69.9 kg (154 lb)    ROS: Review of Systems  Constitutional: Negative for chills and fever.  Eyes: Negative for blurred vision.  Respiratory: Negative for cough and shortness of breath.   Cardiovascular: Negative for chest pain.  Gastrointestinal: Negative for abdominal pain, constipation, diarrhea, nausea and vomiting.  Genitourinary: Negative for dysuria.  Musculoskeletal: Positive for joint pain.  Neurological: Negative for dizziness and headaches.    Exam: Physical Exam  HENT:  Nose: No mucosal edema.  Mouth/Throat: No oropharyngeal exudate.  Eyes: Conjunctivae, EOM and lids are normal. Pupils are equal, round, and reactive to light.  Neck: Carotid bruit is not present. No thyromegaly present.  Cardiovascular: Regular rhythm, S1 normal, S2 normal and normal heart sounds.   Respiratory: She has no decreased breath sounds. She has no wheezes. She has no rhonchi. She has no rales.  GI: Soft. Bowel sounds are normal. There is no tenderness.  Musculoskeletal:       Right ankle: She exhibits no swelling.       Left ankle: She exhibits decreased range of motion. She exhibits no swelling.  Neurological: She is alert. No cranial nerve deficit.  Skin: Skin is warm. Nails show no clubbing.  Both feet covered with dressings by podiatry  Psychiatric: She has a normal mood and affect. Her speech is not rapid and/or pressured.   dressing present for left foot,  Data  Reviewed: Basic Metabolic Panel:  Recent Labs Lab 08/30/16 0508 09/01/16 0431 09/02/16 0923 09/03/16 0351 09/04/16 2318 09/05/16 0553  NA 139  --   --  137  --   --   K 3.3*  --   --  3.8  --   --   CL 109  --   --  102  --   --   CO2 26  --   --  29  --   --   GLUCOSE 231*  --   --  267*  --   --   BUN 15  --   --  11  --   --   CREATININE 0.76 0.71 0.81 0.78 0.74 0.63  CALCIUM 9.0  --   --  9.3  --   --    Liver Function Tests: No results for input(s): AST, ALT, ALKPHOS, BILITOT, PROT, ALBUMIN in the last 168 hours. CBC:  Recent Labs Lab 09/03/16 0351  WBC 7.0  HGB 10.8*  HCT 32.7*  MCV 80.4  PLT 284     Recent Results (from the past 240 hour(s))  Aerobic Culture (superficial specimen)     Status: None   Collection Time: 08/26/16  6:15 PM  Result Value Ref Range Status   Specimen Description FOOT LEFT  Final   Special Requests NONE  Final   Gram Stain   Final    FEW WBC PRESENT, PREDOMINANTLY PMN RARE GRAM POSITIVE COCCI IN PAIRS Performed at [facility]  Lab, [address] [place], [place] [address]    Culture   Final    RARE METHICILLIN RESISTANT STAPHYLOCOCCUS AUREUS MULTIPLE ORGANISMS PRESENT, NONE PREDOMINANT    Report Status 08/30/2016 FINAL  Final   Organism ID, Bacteria METHICILLIN RESISTANT STAPHYLOCOCCUS AUREUS  Final      Susceptibility   Methicillin resistant staphylococcus aureus - MIC*    CIPROFLOXACIN >=8 RESISTANT Resistant     ERYTHROMYCIN >=8 RESISTANT Resistant     GENTAMICIN <=0.5 SENSITIVE Sensitive     OXACILLIN >=4 RESISTANT Resistant     TETRACYCLINE <=1 SENSITIVE Sensitive     VANCOMYCIN 1 SENSITIVE Sensitive     TRIMETH/SULFA <=10 SENSITIVE Sensitive     CLINDAMYCIN <=0.25 SENSITIVE Sensitive     RIFAMPIN <=0.5 SENSITIVE Sensitive     Inducible Clindamycin NEGATIVE Sensitive     * RARE METHICILLIN RESISTANT STAPHYLOCOCCUS AUREUS  Aerobic/Anaerobic Culture (surgical/deep wound)     Status: None   Collection Time:  08/28/16  1:03 PM  Result Value Ref Range Status   Specimen Description TOE  Final   Special Requests SECOND TOE LEFT FOOT BONE CULTURE  Final   Gram Stain   Final    MODERATE WBC PRESENT,BOTH PMN AND MONONUCLEAR NO ORGANISMS SEEN    Culture   Final    RARE STAPHYLOCOCCUS SPECIES (COAGULASE NEGATIVE) CALL MICROBIOLOGY LAB IF SENSITIVITIES ARE REQUIRED. NO ANAEROBES ISOLATED Performed at [facility] Lab, [address], [place], [place] [address]    Report Status 09/04/2016 FINAL  Final  Aerobic Culture (superficial specimen)     Status: None   Collection Time: 08/31/16 12:22 PM  Result Value Ref Range Status   Specimen Description ULCER TOE left great  Final   Special Requests NONE  Final   Gram Stain   Final    FEW WBC PRESENT, PREDOMINANTLY PMN NO ORGANISMS SEEN    Culture   Final    NO GROWTH 2 DAYS Performed at [facility] Lab, [address], [place], [place] [address]    Report Status 09/03/2016 FINAL  Final  Aerobic/Anaerobic Culture (surgical/deep wound)     Status: None (Preliminary result)   Collection Time: 09/04/16 12:54 PM  Result Value Ref Range Status   Specimen Description BONE INFECTED LEFT GREAT TOE  Final   Special Requests NONE  Final   Gram Stain   Final    RARE WBC PRESENT, PREDOMINANTLY MONONUCLEAR NO ORGANISMS SEEN Performed at [facility] Lab, [address], [place], [place] [address]    Culture PENDING  Incomplete   Report Status PENDING  Incomplete     Studies: No results found.  Scheduled Meds: . amLODipine  10 mg Oral Daily  . clonazePAM  1 mg Oral QHS  . divalproex  500 mg Oral BID  . enoxaparin (LOVENOX) injection  40 mg Subcutaneous Q24H  . insulin aspart  0-5 Units Subcutaneous QHS  . insulin aspart  0-9 Units Subcutaneous TID WC  . levothyroxine  100 mcg Oral Q0600  . lisinopril  5 mg Oral Daily  . nicotine  21 mg Transdermal Daily  . QUEtiapine  400 mg Oral QHS    Assessment/Plan:  1. Osteomyelitis of the first  and second toes, second toe was amputated last week, subsequent evaluation of the left great toe ulcer eventually revealed significant purulent drainage and patient had amputation of the lef great toe by podiatry yesterday.t New dressing changes, follow intraoperative cultures, continue isolation for MRSA, continue vancomycin.  2.  3. MRSA:from wound cultures ; continue antibiotics with vancomycin, continue isolation, can not be  transferred to [facility] because of MRSA and isolation.  4.  5. Type 2 diabetes mellitus. Good control hemoglobin A1c 6.7. On sliding scale.  6.  7. Essential hypertension on low-dose lisinopril, amlodipine. 8.  9. Hypothyroidism unspecified on levothyroxine 10.  Bipolar disorder; continue  Klonopin, Seroquel,depakote psych is following, continue IVC.  Code Status:     Code Status Orders        Start     Ordered   08/25/16 2057  Full code  Continuous     08/25/16 2056    Code Status History    Date Active Date Inactive Code Status Order ID Comments User Context   This patient has a current code status but no historical code status.      Disposition Plan;  Consultants:  Podiatry  Psychiatry  Antibiotics:  Vancomycin  Zosyn  Time spent: 22 minutes  [employer]

## 2016-09-05 NOTE — Progress Notes (Signed)
1 Day Post-Op  Subjective: Patient seen. Did have some pain in the left foot around midnight. Also is having some pain in the right arch.  Objective: Vital signs in last 24 hours: Temp:  [97.1 F (36.2 C)-98.8 F (37.1 C)] 98.5 F (36.9 C) (04/18 0841) Pulse Rate:  [70-87] 79 (04/18 0841) Resp:  [12-18] 16 (04/18 0841) BP: (128-155)/(63-83) 128/63 (04/18 0841) SpO2:  [97 %-100 %] 100 % (04/18 0841) Last BM Date: 09/04/16  Intake/Output from previous day: 04/17 0701 - 04/18 0700 In: 420 [P.O.:120; I.V.:300] Out: 650 [Urine:650] Intake/Output this shift: No intake/output data recorded.  Bandages dry and intact on the left foot. No evidence of any strikethrough.  Lab Results:   Recent Labs  09/03/16 0351  WBC 7.0  HGB 10.8*  HCT 32.7*  PLT 284   BMET  Recent Labs  09/03/16 0351 09/04/16 2318 09/05/16 0553  NA 137  --   --   K 3.8  --   --   CL 102  --   --   CO2 29  --   --   GLUCOSE 267*  --   --   BUN 11  --   --   CREATININE 0.78 0.74 0.63  CALCIUM 9.3  --   --    PT/INR No results for input(s): LABPROT, INR in the last 72 hours. ABG No results for input(s): PHART, HCO3 in the last 72 hours.  Invalid input(s): PCO2, PO2  Studies/Results: No results found.  Anti-infectives: Anti-infectives    Start     Dose/Rate Route Frequency Ordered Stop   09/05/16 0200  vancomycin (VANCOCIN) 1,250 mg in sodium chloride 0.9 % 250 mL IVPB     1,250 mg 166.7 mL/hr over 90 Minutes Intravenous Every 12 hours 09/05/16 0040     09/02/16 1200  vancomycin (VANCOCIN) IVPB 1000 mg/200 mL premix  Status:  Discontinued     1,000 mg 200 mL/hr over 60 Minutes Intravenous Every 12 hours 09/02/16 1029 09/05/16 0040   08/31/16 1315  amoxicillin-clavulanate (AUGMENTIN) 875-125 MG per tablet 1 tablet  Status:  Discontinued     1 tablet Oral Every 12 hours 08/31/16 1311 08/31/16 1901   08/31/16 0000  amoxicillin-clavulanate (AUGMENTIN) 875-125 MG tablet     1 tablet Oral Every  12 hours 08/31/16 1319     08/30/16 1800  vancomycin (VANCOCIN) IVPB 750 mg/150 ml premix  Status:  Discontinued     750 mg 150 mL/hr over 60 Minutes Intravenous Every 8 hours 08/30/16 1615 09/02/16 1029   08/30/16 1600  vancomycin (VANCOCIN) 1,250 mg in sodium chloride 0.9 % 250 mL IVPB  Status:  Discontinued     1,250 mg 166.7 mL/hr over 90 Minutes Intravenous Every 8 hours 08/30/16 1555 08/30/16 1603   08/30/16 0000  amoxicillin-clavulanate (AUGMENTIN) 875-125 MG tablet  Status:  Discontinued     1 tablet Oral 2 times daily 08/30/16 1044 08/31/16    08/30/16 0000  doxycycline (VIBRAMYCIN) 50 MG capsule     100 mg Oral 2 times daily 08/30/16 1044     08/28/16 1230  piperacillin-tazobactam (ZOSYN) IVPB 3.375 g     3.375 g 100 mL/hr over 30 Minutes Intravenous  Once 08/28/16 1231 08/28/16 1302   08/27/16 0800  vancomycin (VANCOCIN) 1,250 mg in sodium chloride 0.9 % 250 mL IVPB  Status:  Discontinued     1,250 mg 166.7 mL/hr over 90 Minutes Intravenous Every 8 hours 08/27/16 0751 08/30/16 1000   08/26/16 0100  piperacillin-tazobactam (ZOSYN) IVPB 3.375 g     3.375 g 12.5 mL/hr over 240 Minutes Intravenous Every 8 hours 08/25/16 1652     08/25/16 2300  vancomycin (VANCOCIN) IVPB 1000 mg/200 mL premix  Status:  Discontinued     1,000 mg 200 mL/hr over 60 Minutes Intravenous Every 8 hours 08/25/16 2129 08/27/16 0751   08/25/16 2200  vancomycin (VANCOCIN) IVPB 1000 mg/200 mL premix  Status:  Discontinued     1,000 mg 200 mL/hr over 60 Minutes Intravenous Every 12 hours 08/25/16 1652 08/25/16 2056   08/25/16 1545  piperacillin-tazobactam (ZOSYN) IVPB 3.375 g     3.375 g 100 mL/hr over 30 Minutes Intravenous  Once 08/25/16 1530 08/25/16 1710   08/25/16 1545  vancomycin (VANCOCIN) IVPB 1000 mg/200 mL premix     1,000 mg 200 mL/hr over 60 Minutes Intravenous  Once 08/25/16 1530 08/25/16 1818      Assessment/Plan: s/p Procedure(s): AMPUTATION TOE (Left) Assessment: Stable status post  amputation left great toe.   Plan: Dressing was left intact. We will assess the wound and amputation site tomorrow. If well coapted and stable patient may be stable for transfer to the behavioral unit in the next few days as hopefully the source of the MRSA has been eliminated  LOS: 11 days    Ricci Barker 09/05/2016

## 2016-09-05 NOTE — Consult Note (Signed)
[facility] Face-to-Face Psychiatry Consult   Reason for Consult:  Consult for 51 year old woman who presented to the emergency room last night allegedly for pain in her foot but who was clearly agitated psychotic and showing manic symptoms. Referring Physician:  Leslye Peer Patient Identification: Mary Mcdonald MRN:  [id number] Principal Diagnosis: Bipolar I disorder, most recent episode (or current) manic ([place]) Diagnosis:   Patient Active Problem List   Diagnosis Date Noted  . Bipolar I disorder, most recent episode (or current) manic ([place]) [F31.10] 08/26/2016  . Cellulitis [L03.90] 08/25/2016    Total Time spent with patient: 20 minutes  Subjective:   Mary Mcdonald is a 51 y.o. female patient admitted with "I just came from [place]".  Follow-up for Monday the 16th. Patient seen. Chart reviewed. Patient was calm and reasonably pleasant even though she now understands that there had been a plan in place to have her transferred to the psychiatric unit. Patient is difficult to understand. Seems to be a little bit sedated and confused in her speech. Nevertheless she does basically understand her medical plan. She told me that there was a plan to amputate her great toe tomorrow and that Dr. Caryl Comes had given her this information. Chart shows that she is absolutely correct about that. Appears to be tolerating her medicine reasonably well. Still has a bit of a euphoric affect.  Follow-up for Wednesday the 18th. Patient seen chart reviewed. She had an amputation done yesterday. She tells me she is having some pain but her pain medicine currently is adequate. Patient initially had a somewhat reasonable conversation but quickly to send it into being more incoherent and angry and agitated than previously. Refuses to discuss with any rationality any plan for outpatient treatment. Mood still angry. No sign of any side effects from medicine.  HPI:  Patient interviewed. Chart reviewed. 51 year old woman presented to the  emergency room last night. She was reportedly agitated disorganized and bizarre in her behavior even before getting it made it to the ER. Law enforcement reported that she had been urinating in public. Patient was complaining of pain in her left foot but then was only partially cooperative with evaluation of it. She was noted to be disorganized angry agitated and possibly threatening and had to be given forced medicines in the emergency room. On interview today the patient says that she just came here from [place] on Friday. Apparently she was planning to stay with someone in [place] but she says that they threw her out and told her to go to the shelter. Her story is rambling and disorganized and hard to make complete sense of. Patient indicates that she is not currently taking any psychiatric medicine. She is evasive about most specific psychiatric questions. Claims to not be having any particular problems with sleep or appetite. Denies suicidal or homicidal ideation. She says that she used to have hallucinations years ago but no longer does. She says the only psychiatric medicine she takes currently as clonazepam because it helps her to sleep. Denies that she is drinking or using any drugs although drug screen is positive for marijuana.  Social history: From what I can tell she is primarily located in [place]. She named several hospitals back there. At the same time she says that she was planning to relocate here to [facility]. Unclear exactly who she was planning to stay with.  Medical history: Has cellulitis. I'm unclear about the rest of her medical history. When I ask her about other  illnesses she said yes to everything I ask. Couldn't name any specific medicines that she takes.  Substance abuse history: Denies use of alcohol says that she doesn't use any drugs denies any history of substance abuse treatment. Drug screen positive for cannabis  Past Psychiatric  History: Evasive about this and we don't have much in the way of any old records but she does indicate she's had bipolar disorder that has been diagnosed "all my life". She says she's had several hospitalizations in the past. Denies any history of suicide attempts. Admits to a history of violence. She says for years she took Depakote and Seroquel but doesn't do that anymore. She to lithium.  Risk to Self: Is patient at risk for suicide?: No (IVC at this time with 1:1 sitter) Risk to Others:   Prior Inpatient Therapy:   Prior Outpatient Therapy:    Past Medical History:  Past Medical History:  Diagnosis Date  . Bipolar 1 disorder [facility])     Past Surgical History:  Procedure Laterality Date  . 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 No pertinent family history. Family Psychiatric  History: Does not know of any Social History:  History  Alcohol use Not on file     History  Drug use: Unknown    Social History   Social History  . Marital status: Widowed    Spouse name: N/A  . Number of children: N/A  . Years of education: N/A   Social History Main Topics  . Smoking status: Current Every Day Smoker  . Smokeless tobacco: Never Used  . Alcohol use None  . Drug use: Unknown  . Sexual activity: Not Asked   Other Topics Concern  . None   Social History Narrative  . None   Additional Social History:    Allergies:   Allergies  Allergen Reactions  . Lithium Rash    Labs:  Results for orders placed or performed during the hospital encounter of 08/25/16 (from the past 48 hour(s))  Glucose, capillary     Status: Abnormal   Collection Time: 09/03/16  9:26 PM  Result Value Ref Range   Glucose-Capillary 148 (H) 65 - 99 mg/dL  Glucose, capillary     Status: Abnormal   Collection Time: 09/04/16  7:40 AM  Result Value Ref Range    Glucose-Capillary 122 (H) 65 - 99 mg/dL  Vancomycin, trough     Status: Abnormal   Collection Time: 09/04/16 11:14 AM  Result Value Ref Range   Vancomycin Tr 24 (HH) 15 - 20 ug/mL    Comment: CRITICAL RESULT CALLED TO, READ BACK BY AND VERIFIED WITH  HANK ZOMPA AT 1245 09/04/16 SDR   Glucose, capillary     Status: None   Collection Time: 09/04/16 11:44 AM  Result Value Ref Range   Glucose-Capillary 92 65 - 99 mg/dL  Aerobic/Anaerobic Culture (surgical/deep wound)     Status: None (Preliminary result)   Collection Time: 09/04/16 12:54 PM  Result Value Ref Range   Specimen Description BONE INFECTED LEFT GREAT TOE    Special Requests NONE    Gram Stain      RARE WBC PRESENT, PREDOMINANTLY MONONUCLEAR NO ORGANISMS SEEN    Culture      NO GROWTH < 24 HOURS Performed at [facility], [address], [place], [place] [address]  Report Status PENDING   Glucose, capillary     Status: None   Collection Time: 09/04/16  1:27 PM  Result Value Ref Range   Glucose-Capillary 89 65 - 99 mg/dL  Glucose, capillary     Status: Abnormal   Collection Time: 09/04/16  4:07 PM  Result Value Ref Range   Glucose-Capillary 246 (H) 65 - 99 mg/dL  Glucose, capillary     Status: Abnormal   Collection Time: 09/04/16  9:16 PM  Result Value Ref Range   Glucose-Capillary 173 (H) 65 - 99 mg/dL  Vancomycin, trough     Status: Abnormal   Collection Time: 09/04/16 11:18 PM  Result Value Ref Range   Vancomycin Tr 14 (L) 15 - 20 ug/mL  Creatinine, serum     Status: None   Collection Time: 09/04/16 11:18 PM  Result Value Ref Range   Creatinine, Ser 0.74 0.44 - 1.00 mg/dL   GFR calc non Af Amer >60 >60 mL/min   GFR calc Af Amer >60 >60 mL/min    Comment: (NOTE) The eGFR has been calculated using the CKD EPI equation. This calculation has not been validated in all clinical situations. eGFR's persistently <60 mL/min signify possible Chronic Kidney Disease.   Creatinine, serum     Status: None    Collection Time: 09/05/16  5:53 AM  Result Value Ref Range   Creatinine, Ser 0.63 0.44 - 1.00 mg/dL   GFR calc non Af Amer >60 >60 mL/min   GFR calc Af Amer >60 >60 mL/min    Comment: (NOTE) The eGFR has been calculated using the CKD EPI equation. This calculation has not been validated in all clinical situations. eGFR's persistently <60 mL/min signify possible Chronic Kidney Disease.   Glucose, capillary     Status: Abnormal   Collection Time: 09/05/16  7:58 AM  Result Value Ref Range   Glucose-Capillary 200 (H) 65 - 99 mg/dL  Glucose, capillary     Status: Abnormal   Collection Time: 09/05/16 12:24 PM  Result Value Ref Range   Glucose-Capillary 296 (H) 65 - 99 mg/dL  Glucose, capillary     Status: Abnormal   Collection Time: 09/05/16  4:25 PM  Result Value Ref Range   Glucose-Capillary 152 (H) 65 - 99 mg/dL    Current Facility-Administered Medications  Medication Dose Route Frequency Provider Last Rate Last Dose  . amLODipine (NORVASC) tablet 10 mg  10 mg Oral Daily Harrie Foreman, MD   10 mg at 09/05/16 1014  . clonazePAM (KLONOPIN) tablet 1 mg  1 mg Oral QHS Gonzella Lex, MD   1 mg at 09/04/16 2220  . divalproex (DEPAKOTE) DR tablet 750 mg  750 mg Oral BID Gonzella Lex, MD      . enoxaparin (LOVENOX) injection 40 mg  40 mg Subcutaneous Q24H Debby Crosley, MD   40 mg at 09/04/16 2221  . HYDROcodone-acetaminophen (NORCO/VICODIN) 5-325 MG per tablet 1 tablet  1 tablet Oral Q4H PRN Lance Coon, MD   1 tablet at 08/30/16 0204  . HYDROcodone-acetaminophen (NORCO/VICODIN) 5-325 MG per tablet 1-2 tablet  1-2 tablet Oral Q4H PRN Sharlotte Alamo, DPM   2 tablet at 09/05/16 1627  . insulin aspart (novoLOG) injection 0-5 Units  0-5 Units Subcutaneous QHS Loletha Grayer, MD   2 Units at 09/02/16 2124  . insulin aspart (novoLOG) injection 0-9 Units  0-9 Units Subcutaneous TID WC Loletha Grayer, MD   5 Units at 09/05/16 1351  . levothyroxine (SYNTHROID, LEVOTHROID) tablet 100  mcg  100  mcg Oral Q0600 Gonzella Lex, MD   100 mcg at 09/05/16 [phone]  . lisinopril (PRINIVIL,ZESTRIL) tablet 5 mg  5 mg Oral Daily Loletha Grayer, MD   5 mg at 09/05/16 1013  . LORazepam (ATIVAN) injection 2 mg  2 mg Intravenous Q6H PRN Debby Crosley, MD      . nicotine (NICODERM CQ - dosed in mg/24 hours) patch 21 mg  21 mg Transdermal Daily Henreitta Leber, MD   21 mg at 09/05/16 1013  . piperacillin-tazobactam (ZOSYN) IVPB 3.375 g  3.375 g Intravenous Q8H Merilyn Baba, RPH   3.375 g at 09/05/16 1351  . QUEtiapine (SEROQUEL) tablet 400 mg  400 mg Oral QHS Chauncey Mann, MD   400 mg at 09/04/16 2222  . vancomycin (VANCOCIN) 1,250 mg in sodium chloride 0.9 % 250 mL IVPB  1,250 mg Intravenous Q12H Debby Crosley, MD 166.7 mL/hr at 09/05/16 1356 1,250 mg at 09/05/16 1356    Musculoskeletal: Strength & Muscle Tone: within normal limits Gait & Station: normal Patient leans: N/A  Psychiatric Specialty Exam: Physical Exam  Nursing note and vitals reviewed. Constitutional: She appears well-developed and well-nourished.  HENT:  Head: Normocephalic and atraumatic.  Eyes: Conjunctivae are normal. Pupils are equal, round, and reactive to light.  Neck: Normal range of motion.  Cardiovascular: Regular rhythm and normal heart sounds.   Respiratory: Effort normal. No respiratory distress.  GI: Soft.  Musculoskeletal: Normal range of motion.       Feet:  Neurological: She is alert.  Skin: Skin is warm and dry.  Psychiatric: Her affect is labile and inappropriate. Her affect is not angry. Her speech is tangential. She is not agitated and not hyperactive. Thought content is paranoid. She expresses impulsivity. She expresses no homicidal and no suicidal ideation. She exhibits abnormal recent memory. She is inattentive.    Review of Systems  Constitutional: Negative.   HENT: Negative.   Eyes: Negative.   Respiratory: Negative.   Cardiovascular: Negative.   Gastrointestinal: Negative.   Musculoskeletal:  Negative.   Skin: Negative.   Neurological: Negative.   Psychiatric/Behavioral: Positive for memory loss. Negative for depression, hallucinations, substance abuse and suicidal ideas. The patient is not nervous/anxious and does not have insomnia.     Blood pressure 128/63, pulse 79, temperature 98.5 F (36.9 C), temperature source Oral, resp. rate 16, height 5' 11"  (1.803 m), weight 69.9 kg (154 lb), SpO2 100 %.Body mass index is 21.48 kg/m.  General Appearance: Casual  Eye Contact:  Fair  Speech:  Garbled and Pressured  Volume:  Increased  Mood:  Euphoric and Irritable  Affect:  Inappropriate and Labile  Thought Process:  Disorganized  Orientation:  Other:  She knew she was in a hospital and could tell me the name of it. She told me the year was 1967 but am not sure she really understood the question.  Thought Content:  Rumination  Suicidal Thoughts:  No  Homicidal Thoughts:  No  Memory:  Immediate;   Fair Recent;   Poor Remote;   Fair  Judgement:  Impaired  Insight:  Shallow  Psychomotor Activity:  Increased and Restlessness  Concentration:  Concentration: Poor  Recall:  Poor  Fund of Knowledge:  Poor  Language:  Fair  Akathisia:  No  Handed:  Right  AIMS (if indicated):     Assets:  Resilience  ADL's:  Intact  Cognition:  Impaired,  Mild  Sleep:  Treatment Plan Summary: Daily contact with patient to assess and evaluate symptoms and progress in treatment, Medication management and Plan Patient continues to be manic disorganized thinking. Unclear what kind of disposition she will have. She told me that some family were coming down from [place] but when I asked if they would take her back up there she got angry and became disorganized. I am increasing her Depakote to 750 mg twice a day. Continue current Seroquel. We will continue to follow-up regularly.  Disposition: Recommend psychiatric Inpatient admission when medically cleared. Supportive therapy provided about  ongoing stressors.  Alethia Berthold, MD 09/05/2016 7:29 PM

## 2016-09-05 NOTE — Progress Notes (Signed)
Inpatient Diabetes Program Recommendations  AACE/ADA: New Consensus Statement on Inpatient Glycemic Control (2015)  Target Ranges:  Prepandial:   less than 140 mg/dL      Peak postprandial:   less than 180 mg/dL (1-2 hours)      Critically ill patients:  140 - 180 mg/dL   Results for Mary Mcdonald, Mary Mcdonald (MRN [id number]) as of 09/05/2016 13:10  Ref. Range 09/04/2016 07:40 09/04/2016 11:44 09/04/2016 13:27 09/04/2016 16:07 09/04/2016 21:16  Glucose-Capillary Latest Ref Range: 65 - 99 mg/dL 409 (H) 92 89 811 (H) 914 (H)   Results for Mary Mcdonald, Mary Mcdonald (MRN [id number]) as of 09/05/2016 13:10  Ref. Range 09/05/2016 07:58 09/05/2016 12:24  Glucose-Capillary Latest Ref Range: 65 - 99 mg/dL 086 (H) 578 (H)    Current Insulin Orders: Novolog Sensitive Correction Scale/ SSI (0-9 units) TID AC + HS      MD- Note CBGs elevated today.  CBGs yesterday were fair, but this may be due to the fact that patient was NPO most of the day for amputation of toe.  CBGs so far today: 200/ 296 mg/dl.  Please consider the following:  Start Novolog Meal Coverage: Novolog 3 units TID with meals (hold if pt eats <50% of meal)     --Will follow patient during hospitalization--  Ambrose Finland RN, MSN, CDE Diabetes Coordinator Inpatient Glycemic Control Team Team Pager: [phone] (8a-5p)

## 2016-09-05 NOTE — Progress Notes (Signed)
Shift assessment completed. Pt received pain medication with am meds, 1:! Sitter remains at bedside. Pt has l foot wrapped ace wrap and gauze, elevated on two pillows. Dr. Luberta Mutter has rounded on pt. Report given to [place], Rn at 1115, care of pt is released.

## 2016-09-06 ENCOUNTER — Encounter: Payer: Self-pay | Admitting: Podiatry

## 2016-09-06 LAB — GLUCOSE, CAPILLARY
GLUCOSE-CAPILLARY: 157 mg/dL — AB (ref 65–99)
Glucose-Capillary: 242 mg/dL — ABNORMAL HIGH (ref 65–99)
Glucose-Capillary: 115 mg/dL — ABNORMAL HIGH (ref 65–99)
Glucose-Capillary: 187 mg/dL — ABNORMAL HIGH (ref 65–99)
Glucose-Capillary: 180 mg/dL — ABNORMAL HIGH (ref 65–99)
Glucose-Capillary: 218 mg/dL — ABNORMAL HIGH (ref 65–99)

## 2016-09-06 LAB — VANCOMYCIN, TROUGH: Vancomycin Tr: 16 ug/mL (ref 15–20)

## 2016-09-06 MED ORDER — INSULIN ASPART 100 UNIT/ML ~~LOC~~ SOLN: 0.0000 [IU] | Freq: Every day | SUBCUTANEOUS | 11 refills | Status: DC

## 2016-09-06 MED ORDER — INSULIN ASPART 100 UNIT/ML ~~LOC~~ SOLN: 0.0000 [IU] | Freq: Three times a day (TID) | SUBCUTANEOUS | 11 refills | Status: DC

## 2016-09-06 MED ORDER — AMOXICILLIN-POT CLAVULANATE 875-125 MG PO TABS: 1.0000 | ORAL_TABLET | Freq: Two times a day (BID) | ORAL | 0 refills | Status: DC

## 2016-09-06 MED ORDER — QUETIAPINE FUMARATE 400 MG PO TABS: 400.0000 mg | ORAL_TABLET | Freq: Every day | ORAL | 0 refills | Status: DC

## 2016-09-06 MED ORDER — DIVALPROEX SODIUM 250 MG PO DR TAB: 750.0000 mg | DELAYED_RELEASE_TABLET | Freq: Two times a day (BID) | ORAL | 0 refills | Status: DC

## 2016-09-06 MED ORDER — DOXYCYCLINE HYCLATE 50 MG PO CAPS: 100.0000 mg | ORAL_CAPSULE | Freq: Two times a day (BID) | ORAL | 0 refills | Status: DC

## 2016-09-06 NOTE — Plan of Care (Signed)
Problem: Safety: Goal: Ability to redirect hostility and anger into socially appropriate behaviors will improve Outcome: Progressing Patient has not shown any aggression to staff members or visitors. Patient has been cooperative and following commands.   Harvie Heck, RN

## 2016-09-06 NOTE — Progress Notes (Signed)
Inpatient Diabetes Program Recommendations  AACE/ADA: New Consensus Statement on Inpatient Glycemic Control (2015)  Target Ranges:  Prepandial:   less than 140 mg/dL      Peak postprandial:   less than 180 mg/dL (1-2 hours)      Critically ill patients:  140 - 180 mg/dL   Lab Results  Component Value Date   GLUCAP 115 (H) 09/06/2016   HGBA1C 7.0 (H) 09/02/2016    Inpatient Diabetes Program Recommendations  AACE/ADA: New Consensus Statement on Inpatient Glycemic Control (2015)  Target Ranges:  Prepandial:   less than 140 mg/dL      Peak postprandial:   less than 180 mg/dL (1-2 hours)      Critically ill patients:  140 - 180 mg/dL   Lab Results  Component Value Date   GLUCAP 115 (H) 09/06/2016   HGBA1C 7.0 (H) 09/02/2016    Review of Glycemic Control  Results for KAMILLA, HANDS (MRN [id number]) as of 09/06/2016 09:36  Ref. Range 09/05/2016 07:58 09/05/2016 12:24 09/05/2016 16:25 09/05/2016 20:53 09/06/2016 07:50  Glucose-Capillary Latest Ref Range: 65 - 99 mg/dL 147 (H) 829 (H) 562 (H) 242 (H) 115 (H)    Current Insulin Orders: Novolog Sensitive Correction Scale/ SSI (0-9 units) TID AC , Novolog 0-5 units qhs  Please consider the following:  Start Novolog Meal Coverage: Novolog 3 units TID with meals (hold if pt eats <50% of meal)- this will ensure the patient gets a small dose of insulin each time she eats   Susette Racer, RN, [place], [place], CDE Diabetes Coordinator Inpatient Diabetes Program  [phone] (Team Pager) [phone] [facility] Office) 09/06/2016 9:39 AM

## 2016-09-06 NOTE — Discharge Summary (Signed)
Mary Mcdonald, is a 51 y.o. female  DOB [birth year]  MRN [id number].  Admission date:  08/25/2016  Admitting Physician  Gery Pray, MD  Discharge Date:  09/06/2016   Primary MD  No PCP Per Patient  Recommendations for primary care physician for things to follow:   Follow-up with Dr. Sampson Goon in 3 weeks Follow-up with Dr. Linus Galas from  Podiatry in 2 weeks   Admission Diagnosis  Swelling [R60.9] Closed displaced fracture of proximal phalanx of left great toe, initial encounter [S92.412A] Cellulitis of lower extremity, unspecified laterality [L03.119] Psychosis, unspecified psychosis type [F29]   Discharge Diagnosis  Swelling [R60.9] Closed displaced fracture of proximal phalanx of left great toe, initial encounter [S92.412A] Cellulitis of lower extremity, unspecified laterality [L03.119] Psychosis, unspecified psychosis type [F29]    Principal Problem:   Bipolar I disorder, most recent episode (or current) manic (HCC) Active Problems:   Cellulitis      Past Medical History:  Diagnosis Date  . Bipolar 1 disorder [facility])     Past Surgical History:  Procedure Laterality Date  . AMPUTATION Left 08/28/2016   Procedure: AMPUTATION DIGIT 2nd toe;  Surgeon: Linus Galas, DPM;  Location: [facility] ORS;  Service: Podiatry;  Laterality: Left;  . AMPUTATION TOE Left 09/04/2016   Procedure: AMPUTATION TOE;  Surgeon: Linus Galas, DPM;  Location: [facility] ORS;  Service: Podiatry;  Laterality: Left;    Original  discharge summary done on April 13 but she did not go that day because left the second toe amputated site showed MRSA. Patient had left great toe amputation subsequently because of purulent infection. Cardiac telemetry did amputation of left great toe as well. And he feels that MRSA resources completely eliminated this time. Mary Mcdonald  from infectious prevention's saw the patient, she recommended private room  and contact precautions and be able to meet criteria in this policy to attend group activities, eat in dining room, and use common areas. Please call IP for questions or concerns at [phone].   History of present illness and  Hospital Course:     Kindly see H&P for history of present illness and admission details, please review complete Labs, Consult reports and Test reports for all details in brief  HPI  from the history and physical done on the day of admission 51year-old female patient with bipolar disorder initially on the psych side noted to have ulcer on the left second toe with some purulence, admitted to medicine for cellulitis of the left foot.  Hospital Course   #1 left foot  Cellulitis/ osteomyelitis of the left second toe: Seen by ID,  Podiatry.initially was on IV antibiotics, patient is taken to the Operating  room on April 10, patient had amputation of left second toe for osteomyelitis, and also had left great toe amputated on 17 th April because of continued purulent I dranage.. with Dr. Sampson Goon recommended Augmentin 875 mg by mouth twice a day for 2 weeks, doxycycline 100 mg by mouth twice a day 2 weeks starting . Patient is medically stable for transfer to [facility] and arrangements are made.see Infection prevention team note,  2 agitation, psychosis, manic episodes, history of bipolar disorder, patient is made IVC, seen by psychiatry DR.Clapacs. He recommended inpatient psychiatric admission. Patient medically clear. Continue  Klonopin, Depakote, Seroquel. #3 hypothyroidism continue Synthroid 100 g daily   Discharge Condition: Stable  patient is stable for transfer to [facility] when arrangements made. According to psych note patient is going to inpatient psych admission  Follow UP      Discharge Instructions  and  Discharge Medications      Allergies as of 09/06/2016      Reactions   Lithium  Rash      Medication List    TAKE these medications   amLODipine 10 MG tablet Commonly known as:  NORVASC Take 1 tablet (10 mg total) by mouth daily.   amoxicillin-clavulanate 875-125 MG tablet Commonly known as:  AUGMENTIN Take 1 tablet by mouth every 12 (twelve) hours.   clonazePAM 1 MG tablet Commonly known as:  KLONOPIN Take 1 tablet (1 mg total) by mouth at bedtime. What changed:  medication strength  how much to take  when to take this   divalproex 250 MG DR tablet Commonly known as:  DEPAKOTE Take 3 tablets (750 mg total) by mouth 2 (two) times daily.   doxycycline 50 MG capsule Commonly known as:  VIBRAMYCIN Take 2 capsules (100 mg total) by mouth 2 (two) times daily.   HYDROcodone-acetaminophen 5-325 MG tablet Commonly known as:  NORCO/VICODIN Take 1 tablet by mouth every 4 (four) hours as needed for moderate pain.   insulin aspart 100 UNIT/ML injection Commonly known as:  novoLOG Inject 0-5 Units into the skin at bedtime.   insulin aspart 100 UNIT/ML injection Commonly known as:  novoLOG Inject 0-9 Units into the skin 3 (three) times daily with meals.   levothyroxine 100 MCG tablet Commonly known as:  SYNTHROID, LEVOTHROID Take 100 mcg by mouth daily before breakfast.   lisinopril 30 MG tablet Commonly known as:  PRINIVIL,ZESTRIL Take 30 mg by mouth daily.   QUEtiapine 400 MG tablet Commonly known as:  SEROQUEL Take 1 tablet (400 mg total) by mouth at bedtime.         Diet and Activity recommendation: See Discharge Instructions above   Consults obtained -ID,PSYCHIATRY   Major procedures and Radiology Reports - PLEASE review detailed and final reports for all details, in brief -     Mr Foot Left Wo Contrast  Result Date: 08/27/2016 CLINICAL DATA:  Left foot wounds with an ulceration on the second toe. Redness, pain and swelling of the left foot. Elevated white blood cell count. EXAM: MRI OF THE LEFT FOOT WITHOUT CONTRAST TECHNIQUE:  Multiplanar, multisequence MR imaging of the left foot Was performed. No intravenous contrast was administered. COMPARISON:  Plain films left foot 08/25/2016. FINDINGS: Bones/Joint/Cartilage Fracture of the head of the proximal phalanx of the great toe is identified as seen on the prior plain films. The fracture is transverse in orientation through the distal metaphysis with a longitudinal component through the central aspect the articular surface. The articular surface is divided into 2 fragments. The distal fracture fragments are superiorly displaced 1 shaft width. There is little to no marrow edema about the fracture most consistent with chronic injury. Marked marrow edema is present throughout the distal phalanx of the great toe. There is also marked marrow edema throughout the middle and distal phalanges of the second toe. A milder degree of marrow edema is seen in the head of the second metatarsal with flattening of the articular surface consistent with Freiberg's infraction. Mildly increased T2 signal in all other imaged bones is identified without focal abnormality may be due to stress change or related to technical factors on the exam. Ligaments Intact. Muscles and Tendons Intact. Soft tissues Intense subcutaneous edema is present over the dorsum of the foot. Fluid is seen between the heads of the first and second metatarsals. Small  second and third MTP joint effusions are noted. IMPRESSION: Marked marrow edema in the distal phalanx of the great toe and middle and distal phalanges of the second toe is most consistent osteomyelitis. Fracture of the head of the proximal phalanx of the great toe appears remote. 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 Intense subcutaneous edema over the dorsum of the foot compatible with cellulitis. No abscess is identified. Fluid between the heads of the first and second metatarsals consistent with  intermetatarsal bursitis. Electronically Signed   By: Drusilla Kanner M.D.   On: 08/27/2016 09:39   Dg Foot Complete Left  Result Date: 08/25/2016 CLINICAL DATA:  Left first toe tenderness and bruising.  No injury. EXAM: LEFT FOOT - COMPLETE 3+ VIEW COMPARISON:  None. FINDINGS: Findings suggest the fracture along the lateral aspect of the head of the first proximal phalanx. This is only seen on the AP view. Mild degenerate change of the midfoot. Minimal spurring over the posterior and inferior calcaneus. IMPRESSION: Minimally displaced fracture along the lateral aspect of the head of the first proximal phalanx. Electronically Signed   By: Elberta Fortis M.D.   On: 08/25/2016 17:19   Dg Foot Complete Right  Result Date: 08/25/2016 CLINICAL DATA:  Right foot swelling.  No injury. EXAM: RIGHT FOOT COMPLETE - 3+ VIEW COMPARISON:  None. FINDINGS: Exam demonstrates amputation distal to the head of the second proximal phalanx as well as amputation distal to the of the third middle phalanx. There is mild irregularity with small bony fragment adjacent the distal aspect of the third middle phalanx likely postsurgical, although cannot exclude a small chip fracture. Irregularity over the head of the second metatarsal on the AP view as cannot exclude a subtle fracture. Minimal degenerate change of the first MTP joint. Minimal spurring over the posterior inferior calcaneus. Mild degenerate change over the midfoot. IMPRESSION: Minimal irregularity over the head of the second metatarsal as cannot exclude a subtle fracture. 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 Mild irregularity with small bony fragment adjacent the distal aspect of the third middle phalanx likely postsurgical, although cannot exclude chip fracture. Electronically Signed   By: Elberta Fortis M.D.   On: 08/25/2016 17:16    Micro Results     Recent Results (from the past 240 hour(s))  Aerobic/Anaerobic Culture  (surgical/deep wound)     Status: None   Collection Time: 08/28/16  1:03 PM  Result Value Ref Range Status   Specimen Description TOE  Final   Special Requests SECOND TOE LEFT FOOT BONE CULTURE  Final   Gram Stain   Final    MODERATE WBC PRESENT,BOTH PMN AND MONONUCLEAR NO ORGANISMS SEEN    Culture   Final    RARE STAPHYLOCOCCUS SPECIES (COAGULASE NEGATIVE) CALL MICROBIOLOGY LAB IF SENSITIVITIES ARE REQUIRED. NO ANAEROBES ISOLATED Performed at [facility], [address] [place], [place] [address]    Report Status 09/04/2016 FINAL  Final  Aerobic Culture (superficial specimen)     Status: None   Collection Time: 08/31/16 12:22 PM  Result Value Ref Range Status   Specimen Description ULCER TOE left great  Final   Special Requests NONE  Final   Gram Stain   Final    FEW WBC PRESENT, PREDOMINANTLY PMN NO ORGANISMS SEEN    Culture   Final    NO GROWTH 2 DAYS Performed at [facility], [address],  [place], [place] [address]    Report Status 09/03/2016 FINAL  Final  Aerobic/Anaerobic Culture (surgical/deep wound)     Status: None (Preliminary result)   Collection Time: 09/04/16 12:54 PM  Result Value Ref Range Status   Specimen Description BONE INFECTED LEFT GREAT TOE  Final   Special Requests NONE  Final   Gram Stain   Final    RARE WBC PRESENT, PREDOMINANTLY MONONUCLEAR NO ORGANISMS SEEN    Culture   Final    NO GROWTH 2 DAYS Performed at [facility], [address], [place], [place] [address]    Report Status PENDING  Incomplete       Today   Subjective:   Mary Mcdonald today stableFor discharge. Objective:   Blood pressure (!) 118/47, pulse 78, temperature 98.4 F (36.9 C), temperature source Oral, resp. rate 16, height  (1.803 m), weight 69.9 kg (154 lb), SpO2 100 %.   Intake/Output Summary (Last 24 hours) at 09/06/16 1104 Last data filed at 09/06/16 0900  Gross per 24 hour  Intake             1350 ml  Output                 0 ml  Net             1350 ml    Exam Awake Alert, Oriented x 3, No new F.N deficits, Normal affect [place].AT,PERRAL Supple Neck,No JVD, No cervical lymphadenopathy appriciated.  Symmetrical Chest wall movement, Good air movement bilaterally, CTAB RRR,No Gallops,Rubs or new Murmurs, No Parasternal Heave +ve B.Sounds, Abd Soft, Non tender, No organomegaly appriciated, No rebound -guarding or rigidity. dressing present for the right leg. Data Review   CBC w Diff:  Lab Results  Component Value Date   WBC 7.0 09/03/2016   HGB 10.8 (L) 09/03/2016   HCT 32.7 (L) 09/03/2016   PLT 284 09/03/2016    CMP:  Lab Results  Component Value Date   NA 137 09/03/2016   K 3.8 09/03/2016   CL 102 09/03/2016   CO2 29 09/03/2016   BUN 11 09/03/2016   CREATININE 0.63 09/05/2016   PROT 7.4 08/25/2016   ALBUMIN 3.8 08/25/2016   BILITOT 0.8 08/25/2016   ALKPHOS 91 08/25/2016   AST 26 08/25/2016   ALT 33 08/25/2016  .   Total Time in preparing paper work, data evaluation and todays exam - 35 minutes  Mary Mcdonald M.D on 09/06/2016 at 11:04 AM    Note: This dictation was prepared with Dragon dictation along with smaller phrase technology. Any transcriptional errors that result from this process are unintentional.

## 2016-09-06 NOTE — Consult Note (Signed)
[facility] Face-to-Face Psychiatry Consult   Reason for Consult:  Consult for 51 year old woman who presented to the emergency room last night allegedly for pain in her foot but who was clearly agitated psychotic and showing manic symptoms. Referring Physician:  Leslye Peer Patient Identification: Mary Mcdonald MRN:  [id number] Principal Diagnosis: Bipolar I disorder, most recent episode (or current) manic ([place]) Diagnosis:   Patient Active Problem List   Diagnosis Date Noted  . Bipolar I disorder, most recent episode (or current) manic ([place]) [F31.10] 08/26/2016  . Cellulitis [L03.90] 08/25/2016    Total Time spent with patient: 20 minutes  Subjective:   Mary Mcdonald is a 51 y.o. female patient admitted with "I just came from [place]".  Follow-up for Monday the 16th. Patient seen. Chart reviewed. Patient was calm and reasonably pleasant even though she now understands that there had been a plan in place to have her transferred to the psychiatric unit. Patient is difficult to understand. Seems to be a little bit sedated and confused in her speech. Nevertheless she does basically understand her medical plan. She told me that there was a plan to amputate her great toe tomorrow and that Dr. Caryl Comes had given her this information. Chart shows that she is absolutely correct about that. Appears to be tolerating her medicine reasonably well. Still has a bit of a euphoric affect.  Follow-up for Wednesday the 18th. Patient seen chart reviewed. She had an amputation done yesterday. She tells me she is having some pain but her pain medicine currently is adequate. Patient initially had a somewhat reasonable conversation but quickly to send it into being more incoherent and angry and agitated than previously. Refuses to discuss with any rationality any plan for outpatient treatment. Mood still angry. No sign of any side effects from medicine.  Follow-up for Thursday the 19th. Patient seen. She was significantly calmer  today. We were able to have a calm and polite conversation. Patient even answered my questions about discharge with only slight irritation. Continues to be a little bit hyper but may possibly be getting some benefit from the medicine. No side effects. Pain appears to be reasonably controlled. She appears to be completely cooperative and appropriate with the medical situation.  HPI:  Patient interviewed. Chart reviewed. 51 year old woman presented to the emergency room last night. She was reportedly agitated disorganized and bizarre in her behavior even before getting it made it to the ER. Law enforcement reported that she had been urinating in public. Patient was complaining of pain in her left foot but then was only partially cooperative with evaluation of it. She was noted to be disorganized angry agitated and possibly threatening and had to be given forced medicines in the emergency room. On interview today the patient says that she just came here from [place] on Friday. Apparently she was planning to stay with someone in [place] but she says that they threw her out and told her to go to the shelter. Her story is rambling and disorganized and hard to make complete sense of. Patient indicates that she is not currently taking any psychiatric medicine. She is evasive about most specific psychiatric questions. Claims to not be having any particular problems with sleep or appetite. Denies suicidal or homicidal ideation. She says that she used to have hallucinations years ago but no longer does. She says the only psychiatric medicine she takes currently as clonazepam because it helps her to sleep. Denies that she is drinking or using any drugs although drug  screen is positive for marijuana.  Social history: From what I can tell she is primarily located in [place]. She named several hospitals back there. At the same time she says that she was planning to relocate here to [facility]. Unclear exactly who she was planning to stay with.  Medical history: Has cellulitis. I'm unclear about the rest of her medical history. When I ask her about other illnesses she said yes to everything I ask. Couldn't name any specific medicines that she takes.  Substance abuse history: Denies use of alcohol says that she doesn't use any drugs denies any history of substance abuse treatment. Drug screen positive for cannabis  Past Psychiatric History: Evasive about this and we don't have much in the way of any old records but she does indicate she's had bipolar disorder that has been diagnosed "all my life". She says she's had several hospitalizations in the past. Denies any history of suicide attempts. Admits to a history of violence. She says for years she took Depakote and Seroquel but doesn't do that anymore. She to lithium.  Risk to Self: Is patient at risk for suicide?: No (IVC at this time with 1:1 sitter) Risk to Others:   Prior Inpatient Therapy:   Prior Outpatient Therapy:    Past Medical History:  Past Medical History:  Diagnosis Date  . Bipolar 1 disorder [facility])     Past Surgical History:  Procedure Laterality Date  . AMPUTATION Left 08/28/2016   Procedure: AMPUTATION DIGIT 2nd toe;  Surgeon: Sharlotte Alamo, DPM;  Location: [facility] ORS;  Service: Podiatry;  Laterality: Left;  . AMPUTATION TOE Left 09/04/2016   Procedure: AMPUTATION TOE;  Surgeon: Sharlotte Alamo, DPM;  Location: [facility] ORS;  Service: Podiatry;  Laterality: Left;   Family History: History reviewed. No pertinent family history. Family Psychiatric  History: Does not know of any Social History:  History  Alcohol use Not on file     History  Drug use: Unknown    Social History   Social History  . Marital status: Widowed    Spouse name: N/A  . Number of children: N/A  . Years of education: N/A   Social History Main Topics  . Smoking status: Current Every Day Smoker  . Smokeless tobacco: Never Used  . Alcohol  use None  . Drug use: Unknown  . Sexual activity: Not Asked   Other Topics Concern  . None   Social History Narrative  . None   Additional Social History:    Allergies:   Allergies  Allergen Reactions  . Lithium Rash    Labs:  Results for orders placed or performed during the hospital encounter of 08/25/16 (from the past 48 hour(s))  Glucose, capillary     Status: Abnormal   Collection Time: 09/04/16  9:16 PM  Result Value Ref Range   Glucose-Capillary 173 (H) 65 - 99 mg/dL  Vancomycin, trough     Status: Abnormal   Collection Time: 09/04/16 11:18 PM  Result Value Ref Range   Vancomycin Tr 14 (L) 15 - 20 ug/mL  Creatinine, serum     Status: None   Collection Time: 09/04/16 11:18 PM  Result Value Ref Range   Creatinine, Ser 0.74 0.44 - 1.00 mg/dL   GFR calc non Af Amer >60 >60 mL/min   GFR calc Af Amer >60 >60 mL/min    Comment: (NOTE) The eGFR has been calculated using the CKD EPI equation. This calculation has not been validated in all  clinical situations. eGFR's persistently <60 mL/min signify possible Chronic Kidney Disease.   Creatinine, serum     Status: None   Collection Time: 09/05/16  5:53 AM  Result Value Ref Range   Creatinine, Ser 0.63 0.44 - 1.00 mg/dL   GFR calc non Af Amer >60 >60 mL/min   GFR calc Af Amer >60 >60 mL/min    Comment: (NOTE) The eGFR has been calculated using the CKD EPI equation. This calculation has not been validated in all clinical situations. eGFR's persistently <60 mL/min signify possible Chronic Kidney Disease.   Glucose, capillary     Status: Abnormal   Collection Time: 09/05/16  7:58 AM  Result Value Ref Range   Glucose-Capillary 200 (H) 65 - 99 mg/dL  Glucose, capillary     Status: Abnormal   Collection Time: 09/05/16 12:24 PM  Result Value Ref Range   Glucose-Capillary 296 (H) 65 - 99 mg/dL  Glucose, capillary     Status: Abnormal   Collection Time: 09/05/16  4:25 PM  Result Value Ref Range   Glucose-Capillary 152  (H) 65 - 99 mg/dL  Glucose, capillary     Status: Abnormal   Collection Time: 09/05/16  8:53 PM  Result Value Ref Range   Glucose-Capillary 242 (H) 65 - 99 mg/dL  Glucose, capillary     Status: Abnormal   Collection Time: 09/06/16  7:50 AM  Result Value Ref Range   Glucose-Capillary 115 (H) 65 - 99 mg/dL  Glucose, capillary     Status: Abnormal   Collection Time: 09/06/16 11:46 AM  Result Value Ref Range   Glucose-Capillary 187 (H) 65 - 99 mg/dL  Vancomycin, trough     Status: None   Collection Time: 09/06/16  1:23 PM  Result Value Ref Range   Vancomycin Tr 16 15 - 20 ug/mL  Glucose, capillary     Status: Abnormal   Collection Time: 09/06/16  4:24 PM  Result Value Ref Range   Glucose-Capillary 180 (H) 65 - 99 mg/dL    Current Facility-Administered Medications  Medication Dose Route Frequency Provider Last Rate Last Dose  . amLODipine (NORVASC) tablet 10 mg  10 mg Oral Daily Harrie Foreman, MD   10 mg at 09/06/16 0826  . clonazePAM (KLONOPIN) tablet 1 mg  1 mg Oral QHS Gonzella Lex, MD   1 mg at 09/05/16 2050  . divalproex (DEPAKOTE) DR tablet 750 mg  750 mg Oral BID Gonzella Lex, MD   750 mg at 09/06/16 0827  . enoxaparin (LOVENOX) injection 40 mg  40 mg Subcutaneous Q24H Debby Crosley, MD   40 mg at 09/05/16 2108  . HYDROcodone-acetaminophen (NORCO/VICODIN) 5-325 MG per tablet 1 tablet  1 tablet Oral Q4H PRN Lance Coon, MD   1 tablet at 09/06/16 [phone]  . HYDROcodone-acetaminophen (NORCO/VICODIN) 5-325 MG per tablet 1-2 tablet  1-2 tablet Oral Q4H PRN Sharlotte Alamo, DPM   2 tablet at 09/06/16 1354  . insulin aspart (novoLOG) injection 0-5 Units  0-5 Units Subcutaneous QHS Loletha Grayer, MD   2 Units at 09/05/16 2112  . insulin aspart (novoLOG) injection 0-9 Units  0-9 Units Subcutaneous TID WC Loletha Grayer, MD   2 Units at 09/06/16 1714  . levothyroxine (SYNTHROID, LEVOTHROID) tablet 100 mcg  100 mcg Oral Q0600 Gonzella Lex, MD   100 mcg at 09/06/16 0500  . lisinopril  (PRINIVIL,ZESTRIL) tablet 5 mg  5 mg Oral Daily Loletha Grayer, MD   5 mg at 09/06/16 0826  .  LORazepam (ATIVAN) injection 2 mg  2 mg Intravenous Q6H PRN Debby Crosley, MD      . nicotine (NICODERM CQ - dosed in mg/24 hours) patch 21 mg  21 mg Transdermal Daily Henreitta Leber, MD   21 mg at 09/06/16 0828  . piperacillin-tazobactam (ZOSYN) IVPB 3.375 g  3.375 g Intravenous Q8H Merilyn Baba, RPH   3.375 g at 09/06/16 1402  . QUEtiapine (SEROQUEL) tablet 400 mg  400 mg Oral QHS Chauncey Mann, MD   400 mg at 09/05/16 2050  . vancomycin (VANCOCIN) 1,250 mg in sodium chloride 0.9 % 250 mL IVPB  1,250 mg Intravenous Q12H Quintella Baton, MD   Stopped at 09/06/16 1524    Musculoskeletal: Strength & Muscle Tone: within normal limits Gait & Station: normal Patient leans: N/A  Psychiatric Specialty Exam: Physical Exam  Nursing note and vitals reviewed. Constitutional: She appears well-developed and well-nourished.  HENT:  Head: Normocephalic and atraumatic.  Eyes: Conjunctivae are normal. Pupils are equal, round, and reactive to light.  Neck: Normal range of motion.  Cardiovascular: Regular rhythm and normal heart sounds.   Respiratory: Effort normal. No respiratory distress.  GI: Soft.  Musculoskeletal: Normal range of motion.       Feet:  Neurological: She is alert.  Skin: Skin is warm and dry.  Psychiatric: Her affect is not angry, not labile and not inappropriate. Her speech is tangential. She is not agitated and not hyperactive. Thought content is not paranoid. She does not express impulsivity. She expresses no homicidal and no suicidal ideation. She exhibits abnormal recent memory. She is inattentive.    Review of Systems  Constitutional: Negative.   HENT: Negative.   Eyes: Negative.   Respiratory: Negative.   Cardiovascular: Negative.   Gastrointestinal: Negative.   Musculoskeletal: Negative.   Skin: Negative.   Neurological: Negative.   Psychiatric/Behavioral: Negative for  depression, hallucinations, memory loss, substance abuse and suicidal ideas. The patient is not nervous/anxious and does not have insomnia.     Blood pressure (!) 118/47, pulse 78, temperature 98.4 F (36.9 C), temperature source Oral, resp. rate 16, height 5' 11"  (1.803 m), weight 69.9 kg (154 lb), SpO2 100 %.Body mass index is 21.48 kg/m.  General Appearance: Casual  Eye Contact:  Fair  Speech:  Garbled and Pressured  Volume:  Increased  Mood:  Euphoric and Irritable  Affect:  Inappropriate and Labile  Thought Process:  Disorganized  Orientation:  Other:  She knew she was in a hospital and could tell me the name of it. She told me the year was 1967 but am not sure she really understood the question.  Thought Content:  Rumination  Suicidal Thoughts:  No  Homicidal Thoughts:  No  Memory:  Immediate;   Fair Recent;   Poor Remote;   Fair  Judgement:  Impaired  Insight:  Shallow  Psychomotor Activity:  Increased and Restlessness  Concentration:  Concentration: Poor  Recall:  Poor  Fund of Knowledge:  Poor  Language:  Fair  Akathisia:  No  Handed:  Right  AIMS (if indicated):     Assets:  Resilience  ADL's:  Intact  Cognition:  Impaired,  Mild  Sleep:        Treatment Plan Summary: Daily contact with patient to assess and evaluate symptoms and progress in treatment, Medication management and Plan I have put in an order to check her valproic acid level tomorrow morning. No change to medication for now. Continue to follow-up as needed.  If needed I will renew her commitment tomorrow as it is going to again run out over the weekend.  Disposition: Recommend psychiatric Inpatient admission when medically cleared. Supportive therapy provided about ongoing stressors.  Alethia Berthold, MD 09/06/2016 6:18 PM

## 2016-09-06 NOTE — Progress Notes (Signed)
2 Days Post-Op  Subjective: Patient seen. Doing well.  Objective: Vital signs in last 24 hours: Temp:  [98.4 F (36.9 C)] 98.4 F (36.9 C) (04/19 0759) Pulse Rate:  [78-86] 78 (04/19 0759) Resp:  [16-19] 16 (04/19 0759) BP: (118-122)/(47-58) 118/47 (04/19 0759) SpO2:  [98 %-100 %] 100 % (04/19 0759) Last BM Date: 09/06/16  Intake/Output from previous day: No intake/output data recorded. Intake/Output this shift: Total I/O In: 1350 [IV Piggyback:1350] Out: -   The bandages dry and intact on the left foot. Upon removal minimal dried blood at the hallux incision amputation site. The incision is well coapted with no evidence of any drainage. Second toe amputation site is also healing nicely.  Lab Results:  No results for input(s): WBC, HGB, HCT, PLT in the last 72 hours. BMET  Recent Labs  09/04/16 2318 09/05/16 0553  CREATININE 0.74 0.63   PT/INR No results for input(s): LABPROT, INR in the last 72 hours. ABG No results for input(s): PHART, HCO3 in the last 72 hours.  Invalid input(s): PCO2, PO2  Studies/Results: No results found.  Anti-infectives: Anti-infectives    Start     Dose/Rate Route Frequency Ordered Stop   09/06/16 0000  doxycycline (VIBRAMYCIN) 50 MG capsule     100 mg Oral 2 times daily 09/06/16 1104     09/06/16 0000  amoxicillin-clavulanate (AUGMENTIN) 875-125 MG tablet     1 tablet Oral Every 12 hours 09/06/16 1104     09/05/16 0200  vancomycin (VANCOCIN) 1,250 mg in sodium chloride 0.9 % 250 mL IVPB     1,250 mg 166.7 mL/hr over 90 Minutes Intravenous Every 12 hours 09/05/16 0040     09/02/16 1200  vancomycin (VANCOCIN) IVPB 1000 mg/200 mL premix  Status:  Discontinued     1,000 mg 200 mL/hr over 60 Minutes Intravenous Every 12 hours 09/02/16 1029 09/05/16 0040   08/31/16 1315  amoxicillin-clavulanate (AUGMENTIN) 875-125 MG per tablet 1 tablet  Status:  Discontinued     1 tablet Oral Every 12 hours 08/31/16 1311 08/31/16 1901   08/31/16 0000   amoxicillin-clavulanate (AUGMENTIN) 875-125 MG tablet  Status:  Discontinued     1 tablet Oral Every 12 hours 08/31/16 1319 09/06/16    08/30/16 1800  vancomycin (VANCOCIN) IVPB 750 mg/150 ml premix  Status:  Discontinued     750 mg 150 mL/hr over 60 Minutes Intravenous Every 8 hours 08/30/16 1615 09/02/16 1029   08/30/16 1600  vancomycin (VANCOCIN) 1,250 mg in sodium chloride 0.9 % 250 mL IVPB  Status:  Discontinued     1,250 mg 166.7 mL/hr over 90 Minutes Intravenous Every 8 hours 08/30/16 1555 08/30/16 1603   08/30/16 0000  amoxicillin-clavulanate (AUGMENTIN) 875-125 MG tablet  Status:  Discontinued     1 tablet Oral 2 times daily 08/30/16 1044 08/31/16    08/30/16 0000  doxycycline (VIBRAMYCIN) 50 MG capsule  Status:  Discontinued     100 mg Oral 2 times daily 08/30/16 1044 09/06/16    08/28/16 1230  piperacillin-tazobactam (ZOSYN) IVPB 3.375 g     3.375 g 100 mL/hr over 30 Minutes Intravenous  Once 08/28/16 1231 08/28/16 1302   08/27/16 0800  vancomycin (VANCOCIN) 1,250 mg in sodium chloride 0.9 % 250 mL IVPB  Status:  Discontinued     1,250 mg 166.7 mL/hr over 90 Minutes Intravenous Every 8 hours 08/27/16 0751 08/30/16 1000   08/26/16 0100  piperacillin-tazobactam (ZOSYN) IVPB 3.375 g     3.375 g 12.5 mL/hr over  240 Minutes Intravenous Every 8 hours 08/25/16 1652     08/25/16 2300  vancomycin (VANCOCIN) IVPB 1000 mg/200 mL premix  Status:  Discontinued     1,000 mg 200 mL/hr over 60 Minutes Intravenous Every 8 hours 08/25/16 2129 08/27/16 0751   08/25/16 2200  vancomycin (VANCOCIN) IVPB 1000 mg/200 mL premix  Status:  Discontinued     1,000 mg 200 mL/hr over 60 Minutes Intravenous Every 12 hours 08/25/16 1652 08/25/16 2056   08/25/16 1545  piperacillin-tazobactam (ZOSYN) IVPB 3.375 g     3.375 g 100 mL/hr over 30 Minutes Intravenous  Once 08/25/16 1530 08/25/16 1710   08/25/16 1545  vancomycin (VANCOCIN) IVPB 1000 mg/200 mL premix     1,000 mg 200 mL/hr over 60 Minutes  Intravenous  Once 08/25/16 1530 08/25/16 1818      Assessment/Plan: s/p Procedure(s): AMPUTATION TOE (Left) Assessment: 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   Plan: Betadine and a sterile bandage reapplied to the left foot. Light bandage applied to the right foot. 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 At this point I would think the infection can be treated as soft tissue with oral antibiotics and patient should be stable for discharge and transfer to behavioral unit.  LOS: 12 days    Ricci Barker 09/06/2016

## 2016-09-06 NOTE — Progress Notes (Signed)
[facility] INFECTI[facility] PROGRESS NOTE Date of Admission:  08/25/2016     ID: Mary Mcdonald is a 51 y.o. female with osteomyelitis L second toe, cellulitis Principal Problem:   Bipolar I disorder, most recent episode (or current) manic ([place]) Active Problems:   Cellulitis   Subjective: Toe has been amputated and she is much more alert and coherent   ROS  Eleven systems are reviewed and negative except per hpi  Medications:  Antibiotics Given (last 72 hours)    Date/Time Action Medication Dose Rate   09/03/16 1202 Given   vancomycin (VANCOCIN) IVPB 1000 mg/200 mL premix 1,000 mg 200 mL/hr   09/03/16 1414 Given   piperacillin-tazobactam (ZOSYN) IVPB 3.375 g 3.375 g 12.5 mL/hr   09/03/16 2109 Given   piperacillin-tazobactam (ZOSYN) IVPB 3.375 g 3.375 g 12.5 mL/hr   09/03/16 2313 Given   vancomycin (VANCOCIN) IVPB 1000 mg/200 mL premix 1,000 mg 200 mL/hr   09/04/16 0459 Given   piperacillin-tazobactam (ZOSYN) IVPB 3.375 g 3.375 g 12.5 mL/hr   09/04/16 1110 Given   vancomycin (VANCOCIN) IVPB 1000 mg/200 mL premix 1,000 mg 200 mL/hr   09/04/16 1421 Given   piperacillin-tazobactam (ZOSYN) IVPB 3.375 g 3.375 g 12.5 mL/hr   09/04/16 2344 Given   piperacillin-tazobactam (ZOSYN) IVPB 3.375 g 3.375 g 12.5 mL/hr   09/05/16 9983 Given   piperacillin-tazobactam (ZOSYN) IVPB 3.375 g 3.375 g 12.5 mL/hr   09/05/16 1351 Given   piperacillin-tazobactam (ZOSYN) IVPB 3.375 g 3.375 g 12.5 mL/hr   09/05/16 2110 Given   piperacillin-tazobactam (ZOSYN) IVPB 3.375 g 3.375 g 12.5 mL/hr   09/06/16 0457 Given   piperacillin-tazobactam (ZOSYN) IVPB 3.375 g 3.375 g 12.5 mL/hr     . amLODipine  10 mg Oral Daily  . clonazePAM  1 mg Oral QHS  . divalproex  750 mg Oral BID  . enoxaparin (LOVENOX) injection  40 mg Subcutaneous Q24H  . insulin aspart  0-5 Units Subcutaneous QHS  . insulin aspart  0-9 Units Subcutaneous TID WC  . levothyroxine  100 mcg Oral Q0600  . lisinopril  5 mg Oral Daily  .  nicotine  21 mg Transdermal Daily  . QUEtiapine  400 mg Oral QHS    Objective: Vital signs in last 24 hours: Temp:  [98.4 F (36.9 C)] 98.4 F (36.9 C) (04/19 0759) Pulse Rate:  [78-86] 78 (04/19 0759) Resp:  [16-19] 16 (04/19 0759) BP: (118-122)/(47-58) 118/47 (04/19 0759) SpO2:  [98 %-100 %] 100 % (04/19 0759) Constitutional:  lying in bed, conversant, more coherent HENT: [place]/AT, PERRLA, no scleral icterus Mouth/Throat: Oropharynx is clear and moist. No oropharyngeal exudate.  Cardiovascular: Normal rate, regular rhythm and normal heart sounds.  Pulmonary/Chest: Effort normal and breath sounds normal. No respiratory distress.  has no wheezes.  Neck = supple, no nuchal rigidity Abdominal: Soft. Bowel sounds are normal.  exhibits no distension. There is no tenderness.  Lymphadenopathy: no cervical adenopathy. No axillary adenopathy Neurological: alert and oriented to person, place, and time.  Ext 2+ edema LLE to mid shin Skin: L foot with amputation site of  L great toe with sutures in place and no  Drainage.  Psychiatric: no agitation   Lab Results  Recent Labs  09/04/16 2318 09/05/16 0553  CREATININE 0.74 0.63   Lab Results  Component Value Date   ESRSEDRATE 21 08/26/2016   Lab Results  Component Value Date   CRP <0.8 08/27/2016   Microbiology: Results for orders placed or performed during the hospital encounter  of 08/25/16  Blood Culture (routine x 2)     Status: None   Collection Time: 08/25/16  4:04 PM  Result Value Ref Range Status   Specimen Description BLOOD  L AC  Final   Special Requests BOTTLES DRAWN AEROBIC AND ANAEROBIC  BCAV  Final   Culture NO GROWTH 5 DAYS  Final   Report Status 08/30/2016 FINAL  Final  Blood Culture (routine x 2)     Status: None   Collection Time: 08/25/16  4:04 PM  Result Value Ref Range Status   Specimen Description BLOOD R HAND  Final   Special Requests BOTTLES DRAWN AEROBIC AND ANAEROBIC  BCAV  Final   Culture NO GROWTH 5  DAYS  Final   Report Status 08/30/2016 FINAL  Final  Urine culture     Status: Abnormal   Collection Time: 08/26/16 12:30 AM  Result Value Ref Range Status   Specimen Description URINE, RANDOM  Final   Special Requests NONE  Final   Culture (A)  Final    <10,000 COLONIES/mL INSIGNIFICANT GROWTH Performed at [facility], [place] [address], [place], [place] [address]    Report Status 08/27/2016 FINAL  Final  Aerobic Culture (superficial specimen)     Status: None   Collection Time: 08/26/16  6:15 PM  Result Value Ref Range Status   Specimen Description FOOT LEFT  Final   Special Requests NONE  Final   Gram Stain   Final    FEW WBC PRESENT, PREDOMINANTLY PMN RARE GRAM POSITIVE COCCI IN PAIRS Performed at [facility], [place] [address] [place], [place] [address]    Culture   Final    RARE METHICILLIN RESISTANT STAPHYLOCOCCUS AUREUS MULTIPLE ORGANISMS PRESENT, NONE PREDOMINANT    Report Status 08/30/2016 FINAL  Final   Organism ID, Bacteria METHICILLIN RESISTANT STAPHYLOCOCCUS AUREUS  Final      Susceptibility   Methicillin resistant staphylococcus aureus - MIC*    CIPROFLOXACIN >=8 RESISTANT Resistant     ERYTHROMYCIN >=8 RESISTANT Resistant     GENTAMICIN <=0.5 SENSITIVE Sensitive     OXACILLIN >=4 RESISTANT Resistant     TETRACYCLINE <=1 SENSITIVE Sensitive     VANCOMYCIN 1 SENSITIVE Sensitive     TRIMETH/SULFA <=10 SENSITIVE Sensitive     CLINDAMYCIN <=0.25 SENSITIVE Sensitive     RIFAMPIN <=0.5 SENSITIVE Sensitive     Inducible Clindamycin NEGATIVE Sensitive     * RARE METHICILLIN RESISTANT STAPHYLOCOCCUS AUREUS  Aerobic/Anaerobic Culture (surgical/deep wound)     Status: None   Collection Time: 08/28/16  1:03 PM  Result Value Ref Range Status   Specimen Description TOE  Final   Special Requests SECOND TOE LEFT FOOT BONE CULTURE  Final   Gram Stain   Final    MODERATE WBC PRESENT,BOTH PMN AND MONONUCLEAR NO ORGANISMS SEEN    Culture   Final    RARE  STAPHYLOCOCCUS SPECIES (COAGULASE NEGATIVE) CALL MICROBIOLOGY LAB IF SENSITIVITIES ARE REQUIRED. NO ANAEROBES ISOLATED Performed at [facility], [place] [address] [place], [place] [address]    Report Status 09/04/2016 FINAL  Final  Aerobic Culture (superficial specimen)     Status: None   Collection Time: 08/31/16 12:22 PM  Result Value Ref Range Status   Specimen Description ULCER TOE left great  Final   Special Requests NONE  Final   Gram Stain   Final    FEW WBC PRESENT, PREDOMINANTLY PMN NO ORGANISMS SEEN    Culture   Final  NO GROWTH 2 DAYS Performed at [facility], [place] [address] [place], [place] [address]    Report Status 09/03/2016 FINAL  Final  Aerobic/Anaerobic Culture (surgical/deep wound)     Status: None (Preliminary result)   Collection Time: 09/04/16 12:54 PM  Result Value Ref Range Status   Specimen Description BONE INFECTED LEFT GREAT TOE  Final   Special Requests NONE  Final   Gram Stain   Final    RARE WBC PRESENT, PREDOMINANTLY MONONUCLEAR NO ORGANISMS SEEN    Culture   Final    NO GROWTH 2 DAYS Performed at [facility], [place] [address], [place], [place] [address]    Report Status PENDING  Incomplete    Studies/Results: No results found.  Assessment/Plan: Juliyah Mergen is a 51 y.o. female with cellulitis of L foot as well as osteomyelitis of 2nd toe and possibly 1st toe Cultures with MRSA and CNS . S/p surgery 4/10 with removal of 2nd toe. And s/p amputation of L great toe 4/17 and site looks good.  ESR only 21, CRP <0.8. HIV negative She has well controlled DM (A1c 6.7) but seems to have PN as I can examine the infected toe without pain.   Recommendations  Cont IV vanco and zosyn while inpatient but can be changed to oral augmentin and doxy when ready for dc to home or to Psych unit I can see in 2 weeks after dc- can try to coordinate follow up so I can see her the same day as Podiatry. Thank you very much for the consult. Will  follow with you.  FITZGERALD, DAVID P   09/06/2016, 11:01 AM

## 2016-09-06 NOTE — Progress Notes (Signed)
Per Dr. Sampson Goon request, IP reviewed chart for appropriateness of transfer as patient is awaiting transfer to [facility]. Please see, "Standard and Transmission-Based Precautions" policy, page 5 for special notes for Behavioral Medicine Unit. Patient will need private room and contact precautions and be able to meet criteria in this policy to attend group activities, eat in dining room, and use common areas. Please call IP for questions or concerns at [phone].

## 2016-09-07 LAB — GLUCOSE, CAPILLARY
GLUCOSE-CAPILLARY: 131 mg/dL — AB (ref 65–99)
GLUCOSE-CAPILLARY: 154 mg/dL — AB (ref 65–99)
GLUCOSE-CAPILLARY: 134 mg/dL — AB (ref 65–99)
Glucose-Capillary: 183 mg/dL — ABNORMAL HIGH (ref 65–99)

## 2016-09-07 LAB — VALPROIC ACID LEVEL: Valproic Acid Lvl: 54 ug/mL (ref 50.0–100.0)

## 2016-09-07 LAB — SURGICAL PATHOLOGY

## 2016-09-07 MED ORDER — AMOXICILLIN-POT CLAVULANATE 875-125 MG PO TABS: 1.0000 | ORAL_TABLET | Freq: Two times a day (BID) | ORAL | 0 refills | Status: DC

## 2016-09-07 MED ORDER — DOXYCYCLINE HYCLATE 50 MG PO CAPS: 100.0000 mg | ORAL_CAPSULE | Freq: Two times a day (BID) | ORAL | 0 refills | Status: DC

## 2016-09-07 NOTE — Progress Notes (Signed)
[facility] [place] [facility]   PATIENT NAME: Mary Mcdonald    MR#:  [id number]  DATE OF BIRTH:  Apr 28, 1966  SUBJECTIVE:  CHIEF COMPLAINT:   Chief Complaint  Patient presents with  . Psychiatric Evaluation   -Patient is more calm today. Following commands. Awaiting to go down to behavioral medicine unit.  REVIEW OF SYSTEMS:  Review of Systems  Constitutional: Negative for chills, fever and malaise/fatigue.  HENT: Negative for congestion, hearing loss, nosebleeds and sinus pain.   Respiratory: Negative for cough, shortness of breath and wheezing.   Cardiovascular: Negative for chest pain, palpitations and leg swelling.  Gastrointestinal: Negative for abdominal pain, constipation, diarrhea, nausea and vomiting.  Genitourinary: Negative for dysuria.  Musculoskeletal: Positive for joint pain.  Neurological: Negative for dizziness, sensory change, speech change, focal weakness, seizures and headaches.  Psychiatric/Behavioral: Negative for depression.    DRUG ALLERGIES:   Allergies  Allergen Reactions  . Lithium Rash    VITALS:  Blood pressure (!) 149/79, pulse 79, temperature 97.9 F (36.6 C), temperature source Oral, resp. rate 20, height  (1.803 m), weight 69.9 kg (154 lb), SpO2 100 %.  PHYSICAL EXAMINATION:  Physical Exam  GENERAL:  51 y.o.-year-old patient lying in the bed with no acute distress.  EYES: Pupils equal, round, reactive to light and accommodation. No scleral icterus. Extraocular muscles intact.  HEENT: Head atraumatic, normocephalic. Oropharynx and nasopharynx clear.  NECK:  Supple, no jugular venous distention. No thyroid enlargement, no tenderness.  LUNGS: Normal breath sounds bilaterally, no wheezing, rales,rhonchi or crepitation. No use of accessory muscles of respiration.  CARDIOVASCULAR: S1, S2 normal. No murmurs, rubs, or gallops.  ABDOMEN: Soft, nontender, nondistended. Bowel sounds present. No organomegaly or mass.    EXTREMITIES: No pedal edema, cyanosis, or clubbing. Left foot in dressing present. NEUROLOGIC: Cranial nerves II through XII are intact. Muscle strength 5/5 in all extremities. Sensation intact. Gait not checked.  PSYCHIATRIC: The patient is alert and oriented, in commands. Appears calm SKIN: No obvious rash, lesion, or ulcer.    LABORATORY PANEL:   CBC  Recent Labs Lab 09/03/16 0351  WBC 7.0  HGB 10.8*  HCT 32.7*  PLT 284   ------------------------------------------------------------------------------------------------------------------  Chemistries   Recent Labs Lab 09/03/16 0351  09/05/16 0553  NA 137  --   --   K 3.8  --   --   CL 102  --   --   CO2 29  --   --   GLUCOSE 267*  --   --   BUN 11  --   --   CREATININE 0.78  < > 0.63  CALCIUM 9.3  --   --   < > = values in this interval not displayed. ------------------------------------------------------------------------------------------------------------------  Cardiac Enzymes No results for input(s): TROPONINI in the last 168 hours. ------------------------------------------------------------------------------------------------------------------  RADIOLOGY:  No results found.  EKG:   Orders placed or performed during the hospital encounter of 08/25/16  . ED EKG 12-Lead  . ED EKG 12-Lead  . EKG 12-Lead  . EKG 12-Lead    ASSESSMENT AND PLAN:   51 year old female with past medical history significant for bipolar 1 disorder presents to hospital secondary to left foot pain and noted to have osteomyelitis in her toes.  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 -Appreciate podiatry and ID consults. -Patient is currently on vancomycin and Zosyn. Plan to discharge on Augmentin and doxycycline as the source of infection  has been removed. -Patient can walk with weightbearing as tolerated using postop shoes.  #2 bipolar disorder with acute maniac  episode-appreciate psych consult. -Patient will be discharged to behavioral medicine unit when bed is available  #3 hypertension-continue Norvasc and lisinopril  #4 hypothyroidism-continue Synthroid  #5 depression and anxiety-on Seroquel and Klonopin  #6 DVT prophylaxis-on Lovenox   All the records are reviewed and case discussed with Care Management/Social Workerr. Management plans discussed with the patient, family and they are in agreement.  CODE STATUS: Full code  TOTAL TIME TAKING CARE OF THIS PATIENT: 38 minutes.   POSSIBLE D/C today, DEPENDING ON CLINICAL CONDITION.   Gerrick Ray M.D on 09/07/2016 at 3:44 PM  Between 7am to 6pm - Pager - [phone]  After 6pm go to [email or web address] - Social research officer, government  [facility]  Office  [phone]  CC: Primary care physician; No PCP Per Patient

## 2016-09-07 NOTE — Plan of Care (Signed)
Problem: Activity: Goal: Ability to perform//tolerate increased activity and mobilize with assistive devices will improve Outcome: Progressing Patient able to ambulate with increased pain/discomfort. Patient states she "feels better being up." Vital signs stable, and patient following commands. Harvie Heck, RN

## 2016-09-07 NOTE — Consult Note (Signed)
[facility] Face-to-Face Psychiatry Consult   Reason for Consult:  Consult for 51 year old woman who presented to the emergency room last night allegedly for pain in her foot but who was clearly agitated psychotic and showing manic symptoms. Referring Physician:  Renae Gloss Patient Identification: Mary Mcdonald MRN:  [id number] Principal Diagnosis: Bipolar I disorder, most recent episode (or current) manic (HCC) Diagnosis:   Patient Active Problem List   Diagnosis Date Noted  . Bipolar I disorder, most recent episode (or current) manic (HCC) [F31.10] 08/26/2016  . Cellulitis [L03.90] 08/25/2016    Total Time spent with patient: 20 minutes  Subjective:   Mary Mcdonald is a 51 y.o. female patient admitted with "I just came from [place]".  Follow-up for Monday the 16th. Patient seen. Chart reviewed. Patient was calm and reasonably pleasant even though she now understands that there had been a plan in place to have her transferred to the psychiatric unit. Patient is difficult to understand. Seems to be a little bit sedated and confused in her speech. Nevertheless she does basically understand her medical plan. She told me that there was a plan to amputate her great toe tomorrow and that Dr. Graciela Husbands had given her this information. Chart shows that she is absolutely correct about that. Appears to be tolerating her medicine reasonably well. Still has a bit of a euphoric affect.  Follow-up for Wednesday the 18th. Patient seen chart reviewed. She had an amputation done yesterday. She tells me she is having some pain but her pain medicine currently is adequate. Patient initially had a somewhat reasonable conversation but quickly to send it into being more incoherent and angry and agitated than previously. Refuses to discuss with any rationality any plan for outpatient treatment. Mood still angry. No sign of any side effects from medicine.  Follow-up for Thursday the 19th. Patient seen. She was significantly calmer  today. We were able to have a calm and polite conversation. Patient even answered my questions about discharge with only slight irritation. Continues to be a little bit hyper but may possibly be getting some benefit from the medicine. No side effects. Pain appears to be reasonably controlled. She appears to be completely cooperative and appropriate with the medical situation.  Follow-up Friday, April 20. Patient seen. Chart reviewed. Spoke with nursing on the ward. I also got to speak to the patient's sister on the telephone. Patient herself has no new complaints. She feels like she is generally getting better. She says she is sleeping a little bit better and has no complaints about her pain. Her affect continues to be pleasant and fairly calm. I spoke with her sister who tells me that everyone in the family feels the patient needs to go downstairs to the psychiatry ward. Everyone in the family feels the patient is really not at any state where they can handle her outside the hospital. Patient is still certainly euphoric and hyperverbal.  HPI:  Patient interviewed. Chart reviewed. 51 year old woman presented to the emergency room last night. She was reportedly agitated disorganized and bizarre in her behavior even before getting it made it to the ER. Law enforcement reported that she had been urinating in public. Patient was complaining of pain in her left foot but then was only partially cooperative with evaluation of it. She was noted to be disorganized angry agitated and possibly threatening and had to be given forced medicines in the emergency room. On interview today the patient says that she just came here from [place] on Friday.  Apparently she was planning to stay with someone in [place] but she says that they threw her out and told her to go to the shelter. Her story is rambling and disorganized and hard to make complete sense of. Patient indicates that she is not currently taking  any psychiatric medicine. She is evasive about most specific psychiatric questions. Claims to not be having any particular problems with sleep or appetite. Denies suicidal or homicidal ideation. She says that she used to have hallucinations years ago but no longer does. She says the only psychiatric medicine she takes currently as clonazepam because it helps her to sleep. Denies that she is drinking or using any drugs although drug screen is positive for marijuana.  Social history: From what I can tell she is primarily located in [place]. She named several hospitals back there. At the same time she says that she was planning to relocate here to [facility]. Unclear exactly who she was planning to stay with.  Medical history: Has cellulitis. I'm unclear about the rest of her medical history. When I ask her about other illnesses she said yes to everything I ask. Couldn't name any specific medicines that she takes.  Substance abuse history: Denies use of alcohol says that she doesn't use any drugs denies any history of substance abuse treatment. Drug screen positive for cannabis  Past Psychiatric History: Evasive about this and we don't have much in the way of any old records but she does indicate she's had bipolar disorder that has been diagnosed "all my life". She says she's had several hospitalizations in the past. Denies any history of suicide attempts. Admits to a history of violence. She says for years she took Depakote and Seroquel but doesn't do that anymore. She to lithium.  Risk to Self: Is patient at risk for suicide?: No (IVC at this time with 1:1 sitter) Risk to Others:   Prior Inpatient Therapy:   Prior Outpatient Therapy:    Past Medical History:  Past Medical History:  Diagnosis Date  . Bipolar 1 disorder [facility])     Past Surgical History:  Procedure Laterality Date  . AMPUTATION Left 08/28/2016   Procedure: AMPUTATION DIGIT 2nd toe;  Surgeon: Linus Galas, DPM;   Location: [facility] ORS;  Service: Podiatry;  Laterality: Left;  . AMPUTATION TOE Left 09/04/2016   Procedure: AMPUTATION TOE;  Surgeon: Linus Galas, DPM;  Location: [facility] ORS;  Service: Podiatry;  Laterality: Left;   Family History: History reviewed. No pertinent family history. Family Psychiatric  History: Does not know of any Social History:  History  Alcohol use Not on file     History  Drug use: Unknown    Social History   Social History  . Marital status: Widowed    Spouse name: N/A  . Number of children: N/A  . Years of education: N/A   Social History Main Topics  . Smoking status: Current Every Day Smoker  . Smokeless tobacco: Never Used  . Alcohol use None  . Drug use: Unknown  . Sexual activity: Not Asked   Other Topics Concern  . None   Social History Narrative  . None   Additional Social History:    Allergies:   Allergies  Allergen Reactions  . Lithium Rash    Labs:  Results for orders placed or performed during the hospital encounter of 08/25/16 (from the past 48 hour(s))  Glucose, capillary     Status: Abnormal   Collection Time: 09/05/16  8:53  PM  Result Value Ref Range   Glucose-Capillary 242 (H) 65 - 99 mg/dL  Glucose, capillary     Status: Abnormal   Collection Time: 09/06/16  7:50 AM  Result Value Ref Range   Glucose-Capillary 115 (H) 65 - 99 mg/dL  Glucose, capillary     Status: Abnormal   Collection Time: 09/06/16 11:46 AM  Result Value Ref Range   Glucose-Capillary 187 (H) 65 - 99 mg/dL  Vancomycin, trough     Status: None   Collection Time: 09/06/16  1:23 PM  Result Value Ref Range   Vancomycin Tr 16 15 - 20 ug/mL  Glucose, capillary     Status: Abnormal   Collection Time: 09/06/16  4:24 PM  Result Value Ref Range   Glucose-Capillary 180 (H) 65 - 99 mg/dL  Glucose, capillary     Status: Abnormal   Collection Time: 09/06/16  9:47 PM  Result Value Ref Range   Glucose-Capillary 218 (H) 65 - 99 mg/dL   Comment 1 Notify RN   Glucose,  capillary     Status: Abnormal   Collection Time: 09/06/16 11:40 PM  Result Value Ref Range   Glucose-Capillary 157 (H) 65 - 99 mg/dL  Valproic acid level     Status: None   Collection Time: 09/07/16  5:27 AM  Result Value Ref Range   Valproic Acid Lvl 54 50.0 - 100.0 ug/mL  Glucose, capillary     Status: Abnormal   Collection Time: 09/07/16  7:56 AM  Result Value Ref Range   Glucose-Capillary 131 (H) 65 - 99 mg/dL  Glucose, capillary     Status: Abnormal   Collection Time: 09/07/16 11:42 AM  Result Value Ref Range   Glucose-Capillary 154 (H) 65 - 99 mg/dL  Glucose, capillary     Status: Abnormal   Collection Time: 09/07/16  4:15 PM  Result Value Ref Range   Glucose-Capillary 134 (H) 65 - 99 mg/dL    Current Facility-Administered Medications  Medication Dose Route Frequency Provider Last Rate Last Dose  . amLODipine (NORVASC) tablet 10 mg  10 mg Oral Daily Arnaldo Natal, MD   10 mg at 09/07/16 [phone]  . clonazePAM (KLONOPIN) tablet 1 mg  1 mg Oral QHS Audery Amel, MD   1 mg at 09/06/16 2359  . divalproex (DEPAKOTE) DR tablet 750 mg  750 mg Oral BID Audery Amel, MD   750 mg at 09/07/16 0837  . enoxaparin (LOVENOX) injection 40 mg  40 mg Subcutaneous Q24H Debby Crosley, MD   40 mg at 09/07/16 0001  . HYDROcodone-acetaminophen (NORCO/VICODIN) 5-325 MG per tablet 1 tablet  1 tablet Oral Q4H PRN Oralia Manis, MD   1 tablet at 09/06/16 [phone]  . HYDROcodone-acetaminophen (NORCO/VICODIN) 5-325 MG per tablet 1-2 tablet  1-2 tablet Oral Q4H PRN Linus Galas, DPM   2 tablet at 09/07/16 1420  . insulin aspart (novoLOG) injection 0-5 Units  0-5 Units Subcutaneous QHS Alford Highland, MD   2 Units at 09/05/16 2112  . insulin aspart (novoLOG) injection 0-9 Units  0-9 Units Subcutaneous TID WC Alford Highland, MD   1 Units at 09/07/16 1703  . levothyroxine (SYNTHROID, LEVOTHROID) tablet 100 mcg  100 mcg Oral Q0600 Audery Amel, MD   100 mcg at 09/07/16 0620  . lisinopril (PRINIVIL,ZESTRIL)  tablet 5 mg  5 mg Oral Daily Alford Highland, MD   5 mg at 09/07/16 0837  . LORazepam (ATIVAN) injection 2 mg  2 mg Intravenous Q6H PRN Debby  Crosley, MD      . nicotine (NICODERM CQ - dosed in mg/24 hours) patch 21 mg  21 mg Transdermal Daily Houston Siren, MD   21 mg at 09/07/16 0837  . piperacillin-tazobactam (ZOSYN) IVPB 3.375 g  3.375 g Intravenous Q8H Rolm Baptise, RPH   3.375 g at 09/07/16 1420  . QUEtiapine (SEROQUEL) tablet 400 mg  400 mg Oral QHS Darliss Ridgel, MD   400 mg at 09/07/16 0000  . vancomycin (VANCOCIN) 1,250 mg in sodium chloride 0.9 % 250 mL IVPB  1,250 mg Intravenous Q12H Gery Pray, MD   Stopped at 09/07/16 1550    Musculoskeletal: Strength & Muscle Tone: within normal limits Gait & Station: normal Patient leans: N/A  Psychiatric Specialty Exam: Physical Exam  Nursing note and vitals reviewed. Constitutional: She appears well-developed and well-nourished.  HENT:  Head: Normocephalic and atraumatic.  Eyes: Conjunctivae are normal. Pupils are equal, round, and reactive to light.  Neck: Normal range of motion.  Cardiovascular: Regular rhythm and normal heart sounds.   Respiratory: Effort normal. No respiratory distress.  GI: Soft.  Musculoskeletal: Normal range of motion.       Feet:  Neurological: She is alert.  Skin: Skin is warm and dry.  Psychiatric: Her affect is not angry, not labile and not inappropriate. Her speech is tangential. She is not agitated and not hyperactive. Thought content is not paranoid. She does not express impulsivity. She expresses no homicidal and no suicidal ideation. She exhibits abnormal recent memory. She is inattentive.    Review of Systems  Constitutional: Negative.   HENT: Negative.   Eyes: Negative.   Respiratory: Negative.   Cardiovascular: Negative.   Gastrointestinal: Negative.   Musculoskeletal: Negative.   Skin: Negative.   Neurological: Negative.   Psychiatric/Behavioral: Negative for depression,  hallucinations, memory loss, substance abuse and suicidal ideas. The patient is not nervous/anxious and does not have insomnia.     Blood pressure (!) 149/79, pulse 79, temperature 97.9 F (36.6 C), temperature source Oral, resp. rate 20, height  (1.803 m), weight 69.9 kg (154 lb), SpO2 100 %.Body mass index is 21.48 kg/m.  General Appearance: Casual  Eye Contact:  Fair  Speech:  Garbled and Pressured  Volume:  Increased  Mood:  Euphoric and Irritable  Affect:  Inappropriate and Labile  Thought Process:  Disorganized  Orientation:  Other:  She knew she was in a hospital and could tell me the name of it. She told me the year was 1967 but am not sure she really understood the question.  Thought Content:  Rumination  Suicidal Thoughts:  No  Homicidal Thoughts:  No  Memory:  Immediate;   Fair Recent;   Poor Remote;   Fair  Judgement:  Impaired  Insight:  Shallow  Psychomotor Activity:  Increased and Restlessness  Concentration:  Concentration: Poor  Recall:  Poor  Fund of Knowledge:  Poor  Language:  Fair  Akathisia:  No  Handed:  Right  AIMS (if indicated):     Assets:  Resilience  ADL's:  Intact  Cognition:  Impaired,  Mild  Sleep:        Treatment Plan Summary: Daily contact with patient to assess and evaluate symptoms and progress in treatment, Medication management and Plan Getting better. Depakote level a little above 50. Spoke with nurses on the medical service. We are still hoping to transfer the patient to the psychiatry unit eventually. It continues to be the case that intravenous antibiotics  can be discontinued. Patient is starting to ambulate a little bit more although still only a few feet. Patient is known to TTS and still on the list for transfer. No change to psychiatric medicine today. Supportive counseling to the patient. I am renewing her involuntary commitment paperwork as it will be expiring over the weekend.  Disposition: Recommend psychiatric Inpatient  admission when medically cleared. Supportive therapy provided about ongoing stressors.  Mordecai Rasmussen, MD 09/07/2016 5:29 PM

## 2016-09-07 NOTE — Progress Notes (Signed)
Inpatient Diabetes Program Recommendations  AACE/ADA: New Consensus Statement on Inpatient Glycemic Control (2015)  Target Ranges:  Prepandial:   less than 140 mg/dL      Peak postprandial:   less than 180 mg/dL (1-2 hours)      Critically ill patients:  140 - 180 mg/dL  Results for MABELLE, MUNGIN (MRN [id number]) as of 09/07/2016 08:40  Ref. Range 09/06/2016 07:50 09/06/2016 11:46 09/06/2016 16:24 09/06/2016 21:47 09/06/2016 23:40 09/07/2016 07:56  Glucose-Capillary Latest Ref Range: 65 - 99 mg/dL 782 (H) 956 (H) 213 (H) 218 (H) 157 (H) 131 (H)  Results for ARRIONA, PREST (MRN [id number]) as of 09/07/2016 08:40  Ref. Range 09/05/2016 07:58 09/05/2016 12:24 09/05/2016 16:25 09/05/2016 20:53  Glucose-Capillary Latest Ref Range: 65 - 99 mg/dL 629 (H) 528 (H) 413 (H) 242 (H)    Review of Glycemic Control  Current orders for Inpatient glycemic control: Novolog 0-9 units TID with meals, Novolog 0-5 units QHS  Inpatient Diabetes Program Recommendations: Insulin - Meal Coverage: Please consider ordering Novolog 3 units TID with meals for meal coverage if post prandial glucose continues to be elevated.  Thanks, Orlando Penner, RN, MSN, CDE Diabetes Coordinator Inpatient Diabetes Program [phone] (Team Pager from 8am to 5pm)

## 2016-09-07 NOTE — Progress Notes (Signed)
Discharge order discontinued by, Behavioral Health Unit unable to take patient today because of staffing issues.  Harvie Heck, RN

## 2016-09-07 NOTE — Discharge Instructions (Signed)
Weight bearing as tolerated using the post op shoes

## 2016-09-08 LAB — BASIC METABOLIC PANEL
Anion gap: 6 (ref 5–15)
BUN: 15 mg/dL (ref 6–20)
CHLORIDE: 104 mmol/L (ref 101–111)
CO2: 27 mmol/L (ref 22–32)
Calcium: 9.2 mg/dL (ref 8.9–10.3)
Creatinine, Ser: 0.59 mg/dL (ref 0.44–1.00)
Glucose, Bld: 247 mg/dL — ABNORMAL HIGH (ref 65–99)
POTASSIUM: 4.1 mmol/L (ref 3.5–5.1)
SODIUM: 137 mmol/L (ref 135–145)

## 2016-09-08 LAB — GLUCOSE, CAPILLARY
GLUCOSE-CAPILLARY: 228 mg/dL — AB (ref 65–99)
GLUCOSE-CAPILLARY: 176 mg/dL — AB (ref 65–99)
GLUCOSE-CAPILLARY: 150 mg/dL — AB (ref 65–99)
Glucose-Capillary: 188 mg/dL — ABNORMAL HIGH (ref 65–99)

## 2016-09-08 LAB — CBC
HCT: 33.2 % — ABNORMAL LOW (ref 35.0–47.0)
HEMOGLOBIN: 10.6 g/dL — AB (ref 12.0–16.0)
MCH: 25.6 pg — ABNORMAL LOW (ref 26.0–34.0)
MCHC: 32.1 g/dL (ref 32.0–36.0)
MCV: 79.7 fL — ABNORMAL LOW (ref 80.0–100.0)
Platelets: 271 10*3/uL (ref 150–440)
RBC: 4.16 MIL/uL (ref 3.80–5.20)
RDW: 14.9 % — ABNORMAL HIGH (ref 11.5–14.5)
WBC: 7.1 10*3/uL (ref 3.6–11.0)

## 2016-09-08 MED ORDER — ONDANSETRON HCL 4 MG/2ML IJ SOLN
4.0000 mg | Freq: Four times a day (QID) | INTRAMUSCULAR | Status: DC | PRN
  Filled 2016-09-08: qty 2

## 2016-09-08 NOTE — Progress Notes (Signed)
Pt alert and oriented. Still receiving antibiotics through iv. Eating and drinking without difficulty. Still waiting for transfer down to [facility] unit.

## 2016-09-08 NOTE — Progress Notes (Addendum)
[facility] - [place] at [facility]   PATIENT NAME: Mary Mcdonald    MR#:  [id number]  DATE OF BIRTH:  [birth year]  SUBJECTIVE:  CHIEF COMPLAINT:   Chief Complaint  Patient presents with  . Psychiatric Evaluation   -Patient remains calm. Following commands. Some pain in the left foot. Sleeping better - blood sugars are elevated  REVIEW OF SYSTEMS:  Review of Systems  Constitutional: Negative for chills, fever and malaise/fatigue.  HENT: Negative for congestion, hearing loss, nosebleeds and sinus pain.   Respiratory: Negative for cough, shortness of breath and wheezing.   Cardiovascular: Negative for chest pain, palpitations and leg swelling.  Gastrointestinal: Negative for abdominal pain, constipation, diarrhea, nausea and vomiting.  Genitourinary: Negative for dysuria.  Musculoskeletal: Positive for joint pain.  Neurological: Negative for dizziness, sensory change, speech change, focal weakness, seizures and headaches.  Psychiatric/Behavioral: Negative for depression.    DRUG ALLERGIES:   Allergies  Allergen Reactions  . Lithium Rash    VITALS:  Blood pressure 139/64, pulse 87, temperature 98.7 F (37.1 C), temperature source Oral, resp. rate 18, height  (1.803 m), weight 69.9 kg (154 lb), SpO2 100 %.  PHYSICAL EXAMINATION:  Physical Exam  GENERAL:  51 y.o.-year-old patient lying in the bed with no acute distress.  EYES: Pupils equal, round, reactive to light and accommodation. No scleral icterus. Extraocular muscles intact.  HEENT: Head atraumatic, normocephalic. Oropharynx and nasopharynx clear.  NECK:  Supple, no jugular venous distention. No thyroid enlargement, no tenderness.  LUNGS: Normal breath sounds bilaterally, no wheezing, rales,rhonchi or crepitation. No use of accessory muscles of respiration.  CARDIOVASCULAR: S1, S2 normal. No murmurs, rubs, or gallops.  ABDOMEN: Soft, nontender, nondistended. Bowel sounds present. No  organomegaly or mass.  EXTREMITIES: No pedal edema, cyanosis, or clubbing. Left foot in dressing present. NEUROLOGIC: Cranial nerves II through XII are intact. Muscle strength 5/5 in all extremities. Sensation intact. Gait not checked.  PSYCHIATRIC: The patient is alert and oriented, in commands. Appears calm SKIN: No obvious rash, lesion, or ulcer.    LABORATORY PANEL:   CBC  Recent Labs Lab 09/08/16 0439  WBC 7.1  HGB 10.6*  HCT 33.2*  PLT 271   ------------------------------------------------------------------------------------------------------------------  Chemistries   Recent Labs Lab 09/08/16 0439  NA 137  K 4.1  CL 104  CO2 27  GLUCOSE 247*  BUN 15  CREATININE 0.59  CALCIUM 9.2   ------------------------------------------------------------------------------------------------------------------  Cardiac Enzymes No results for input(s): TROPONINI in the last 168 hours. ------------------------------------------------------------------------------------------------------------------  RADIOLOGY:  No results found.  EKG:   Orders placed or performed during the hospital encounter of 08/25/16  . ED EKG 12-Lead  . ED EKG 12-Lead  . EKG 12-Lead  . EKG 12-Lead    ASSESSMENT AND PLAN:   51 year old female with past medical history significant for bipolar 1 disorder presents to hospital secondary to left foot pain and noted to have osteomyelitis in her toes.  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 -Appreciate podiatry and ID consults. -Patient is currently on vancomycin and Zosyn. Plan to discharge on Augmentin and doxycycline as the source of infection has been removed. -Patient can walk with weightbearing as tolerated using postop shoes.  #2 bipolar disorder with acute maniac episode-appreciate psych consult. -Patient will be discharged to behavioral medicine unit when bed is available Cont meds  #3  hypertension-continue Norvasc and lisinopril  #4 hypothyroidism-continue Synthroid  #5 depression and anxiety-on Seroquel and Klonopin  #  6 DVT prophylaxis-on Lovenox  #7 Hyperglycemia- check a1c. Not a known diabetic  Medically stable to go to behavioral medicine unit today   All the records are reviewed and case discussed with Care Management/Social Workerr. Management plans discussed with the patient, family and they are in agreement.  CODE STATUS: Full code  TOTAL TIME TAKING CARE OF THIS PATIENT: 36 minutes.   POSSIBLE D/C today, DEPENDING ON CLINICAL CONDITION.   Enid Baas M.D on 09/08/2016 at 12:20 PM  Between 7am to 6pm - Pager - [phone]  After 6pm go to [email or web address] - Social research officer, government  [facility]  Office  [phone]  CC: Primary care physician; No PCP Per Patient

## 2016-09-08 NOTE — Progress Notes (Signed)
Patient says she needs something for nausea. Text sent to Dr. Nemiah Commander

## 2016-09-09 LAB — GLUCOSE, CAPILLARY
GLUCOSE-CAPILLARY: 198 mg/dL — AB (ref 65–99)
GLUCOSE-CAPILLARY: 210 mg/dL — AB (ref 65–99)
Glucose-Capillary: 104 mg/dL — ABNORMAL HIGH (ref 65–99)
Glucose-Capillary: 211 mg/dL — ABNORMAL HIGH (ref 65–99)

## 2016-09-09 LAB — HEMOGLOBIN A1C
Hgb A1c MFr Bld: 7.3 % — ABNORMAL HIGH (ref 4.8–5.6)
Mean Plasma Glucose: 163 mg/dL

## 2016-09-09 MED ORDER — DOXYCYCLINE HYCLATE 100 MG PO TABS
100.0000 mg | ORAL_TABLET | Freq: Two times a day (BID) | ORAL | Status: DC
  Administered 2016-09-09 – 2016-09-11 (×5): 100 mg via ORAL
  Filled 2016-09-09 (×5): qty 1

## 2016-09-09 MED ORDER — AMOXICILLIN-POT CLAVULANATE 875-125 MG PO TABS
1.0000 | ORAL_TABLET | Freq: Two times a day (BID) | ORAL | Status: DC
  Administered 2016-09-09 – 2016-09-11 (×5): 1 via ORAL
  Filled 2016-09-09 (×4): qty 1

## 2016-09-09 NOTE — Progress Notes (Signed)
Spoke with Mary Mcdonald from [facility] in regards to patient coming downstairs. Behavioral health confirmed that they cannot take patient downstairs due to being on isolation for MRSA. Dr Nemiah Commander notified and will speak with psych tomorrow about discharge plans.

## 2016-09-09 NOTE — Progress Notes (Signed)
[facility] - [place] at [facility]   PATIENT NAME: Mary Mcdonald    MR#:  [id number]  DATE OF BIRTH:  02/27/1966  SUBJECTIVE:  CHIEF COMPLAINT:   Chief Complaint  Patient presents with  . Psychiatric Evaluation   -Recent appears calm. Asking when she is going to behavioral medicine unit. -States that she is sleeping better and wants to get her medications straightened out.  REVIEW OF SYSTEMS:  Review of Systems  Constitutional: Negative for chills, fever and malaise/fatigue.  HENT: Negative for congestion, hearing loss, nosebleeds and sinus pain.   Respiratory: Negative for cough, shortness of breath and wheezing.   Cardiovascular: Negative for chest pain, palpitations and leg swelling.  Gastrointestinal: Negative for abdominal pain, constipation, diarrhea, nausea and vomiting.  Genitourinary: Negative for dysuria.  Musculoskeletal: Positive for joint pain.  Neurological: Negative for dizziness, sensory change, speech change, focal weakness, seizures and headaches.  Psychiatric/Behavioral: Negative for depression.    DRUG ALLERGIES:   Allergies  Allergen Reactions  . Lithium Rash    VITALS:  Blood pressure 138/76, pulse 88, temperature 98.6 F (37 C), temperature source Oral, resp. rate 16, height  (1.803 m), weight 69.9 kg (154 lb), SpO2 100 %.  PHYSICAL EXAMINATION:  Physical Exam  GENERAL:  51 y.o.-year-old patient lying in the bed with no acute distress.  EYES: Pupils equal, round, reactive to light and accommodation. No scleral icterus. Extraocular muscles intact.  HEENT: Head atraumatic, normocephalic. Oropharynx and nasopharynx clear.  NECK:  Supple, no jugular venous distention. No thyroid enlargement, no tenderness.  LUNGS: Normal breath sounds bilaterally, no wheezing, rales,rhonchi or crepitation. No use of accessory muscles of respiration.  CARDIOVASCULAR: S1, S2 normal. No murmurs, rubs, or gallops.  ABDOMEN: Soft, nontender,  nondistended. Bowel sounds present. No organomegaly or mass.  EXTREMITIES: No pedal edema, cyanosis, or clubbing. Left foot in dressing present. NEUROLOGIC: Cranial nerves II through XII are intact. Muscle strength 5/5 in all extremities. Sensation intact. Gait not checked.  PSYCHIATRIC: The patient is alert and oriented, in commands. Appears calm SKIN: No obvious rash, lesion, or ulcer.    LABORATORY PANEL:   CBC  Recent Labs Lab 09/08/16 0439  WBC 7.1  HGB 10.6*  HCT 33.2*  PLT 271   ------------------------------------------------------------------------------------------------------------------  Chemistries   Recent Labs Lab 09/08/16 0439  NA 137  K 4.1  CL 104  CO2 27  GLUCOSE 247*  BUN 15  CREATININE 0.59  CALCIUM 9.2   ------------------------------------------------------------------------------------------------------------------  Cardiac Enzymes No results for input(s): TROPONINI in the last 168 hours. ------------------------------------------------------------------------------------------------------------------  RADIOLOGY:  No results found.  EKG:   Orders placed or performed during the hospital encounter of 08/25/16  . ED EKG 12-Lead  . ED EKG 12-Lead  . EKG 12-Lead  . EKG 12-Lead    ASSESSMENT AND PLAN:   51 year old female with past medical history significant for bipolar 1 disorder presents to hospital secondary to left foot pain and noted to have osteomyelitis in her toes.  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 -Appreciate podiatry and ID consults. -Patient is currently on vancomycin and Zosyn. Change to Augmentin and doxycycline as the source of infection has been removed. -Patient can walk with weightbearing as tolerated using postop shoes.  #2 bipolar disorder with acute maniac episode-appreciate psych consult. -Patient will be discharged to behavioral medicine unit when bed is  available Cont meds  #3 hypertension-continue Norvasc and lisinopril  #4 hypothyroidism-continue Synthroid  #5 depression  and anxiety-on Seroquel and Klonopin  #6 DVT prophylaxis-on Lovenox  #7 Hyperglycemia- Improving now. Pending a1c. Not a known diabetic  Medically stable to go to behavioral medicine unit today   All the records are reviewed and case discussed with Care Management/Social Workerr. Management plans discussed with the patient, family and they are in agreement.  CODE STATUS: Full code  TOTAL TIME TAKING CARE OF THIS PATIENT: 36 minutes.   POSSIBLE D/C today, DEPENDING ON CLINICAL CONDITION.   Enid Baas M.D on 09/09/2016 at 11:54 AM  Between 7am to 6pm - Pager - [phone]  After 6pm go to [email or web address] - Social research officer, government  [facility] [place] [facility]  Office  [phone]  CC: Primary care physician; No PCP Per Patient

## 2016-09-10 LAB — GLUCOSE, CAPILLARY
Glucose-Capillary: 148 mg/dL — ABNORMAL HIGH (ref 65–99)
Glucose-Capillary: 208 mg/dL — ABNORMAL HIGH (ref 65–99)
Glucose-Capillary: 125 mg/dL — ABNORMAL HIGH (ref 65–99)
Glucose-Capillary: 185 mg/dL — ABNORMAL HIGH (ref 65–99)

## 2016-09-10 NOTE — Progress Notes (Signed)
Inpatient Diabetes Program Recommendations  AACE/ADA: New Consensus Statement on Inpatient Glycemic Control (2015)  Target Ranges:  Prepandial:   less than 140 mg/dL      Peak postprandial:   less than 180 mg/dL (1-2 hours)      Critically ill patients:  140 - 180 mg/dL  Results for PANG, ROBERS (MRN [id number]) as of 09/10/2016 10:09  Ref. Range 09/09/2016 07:36 09/09/2016 11:40 09/09/2016 16:35 09/09/2016 21:20 09/10/2016 07:45  Glucose-Capillary Latest Ref Range: 65 - 99 mg/dL 829 (H) 562 (H) 130 (H) 211 (H) 148 (H)   Results for RAI, SINAGRA (MRN [id number]) as of 09/10/2016 10:09  Ref. Range 09/08/2016 04:39  Hemoglobin A1C Latest Ref Range: 4.8 - 5.6 % 7.3 (H)   Review of Glycemic Control  Diabetes history: DM2 Outpatient Diabetes medications: Metformin 500 mg BID Current orders for Inpatient glycemic control: Novolog 0-9 units TID with meals, Novolog 0-5 units QHS  Inpatient Diabetes Program Recommendations: Insulin - Meal Coverage: Please consider ordering Novolog 3 units TID with meals for meal coverage if post prandial glucose continues to be elevated. HgbA1C: A1C 7.3% on 09/08/16 indicating an average glucose of 163 mg/dl over the past 2-3 months. Patient will need to follow up with PCP regarding DM control.  NOTE: Spoke with patient and she confirms history of DM2 and states that she is taking Metformin 500 mg BID as an outpatient. Patient states that she is not checking her glucose at home and needs a new glucometer. Informed patient of current A1C 7.3% on 09/08/16 and explained current A1C indicates an average glucose of 163 mg/dl. Discussed glucose and A1C goals. Encouraged patient to follow up with PCP regarding DM control and to start consistently monitoring glucose. Patient verbalized understanding of information and states that she has no further questions at this time.  MD-At time of discharge, please provide patient with Rx for Glucometer and testing supplies.  Thanks, Orlando Penner, RN, MSN, CDE Diabetes Coordinator Inpatient Diabetes Program [phone] (Team Pager from 8am to 5pm)

## 2016-09-10 NOTE — Consult Note (Signed)
[facility] Face-to-Face Psychiatry Consult   Reason for Consult:  Consult for 51 year old woman who presented to the emergency room last night allegedly for pain in her foot but who was clearly agitated psychotic and showing manic symptoms. Referring Physician:  Renae Gloss Patient Identification: Deyna Carbon MRN:  [id number] Principal Diagnosis: Bipolar I disorder, most recent episode (or current) manic (HCC) Diagnosis:   Patient Active Problem List   Diagnosis Date Noted  . Bipolar I disorder, most recent episode (or current) manic (HCC) [F31.10] 08/26/2016  . Cellulitis [L03.90] 08/25/2016    Total Time spent with patient: 20 minutes  Subjective:   [name] is a 51 y.o. female patient admitted with "I just came from [place]".  Follow-up for Monday the 16th. Patient seen. Chart reviewed. Patient was calm and reasonably pleasant even though she now understands that there had been a plan in place to have her transferred to the psychiatric unit. Patient is difficult to understand. Seems to be a little bit sedated and confused in her speech. Nevertheless she does basically understand her medical plan. She told me that there was a plan to amputate her great toe tomorrow and that Dr. Graciela Husbands had given her this information. Chart shows that she is absolutely correct about that. Appears to be tolerating her medicine reasonably well. Still has a bit of a euphoric affect.  Follow-up for Wednesday the 18th. Patient seen chart reviewed. She had an amputation done yesterday. She tells me she is having some pain but her pain medicine currently is adequate. Patient initially had a somewhat reasonable conversation but quickly to send it into being more incoherent and angry and agitated than previously. Refuses to discuss with any rationality any plan for outpatient treatment. Mood still angry. No sign of any side effects from medicine.  Follow-up for Thursday the 19th. Patient seen. She was significantly calmer  today. We were able to have a calm and polite conversation. Patient even answered my questions about discharge with only slight irritation. Continues to be a little bit hyper but may possibly be getting some benefit from the medicine. No side effects. Pain appears to be reasonably controlled. She appears to be completely cooperative and appropriate with the medical situation.  Follow-up Friday, April 20. Patient seen. Chart reviewed. Spoke with nursing on the ward. I also got to speak to the patient's sister on the telephone. Patient herself has no new complaints. She feels like she is generally getting better. She says she is sleeping a little bit better and has no complaints about her pain. Her affect continues to be pleasant and fairly calm. I spoke with her sister who tells me that everyone in the family feels the patient needs to go downstairs to the psychiatry ward. Everyone in the family feels the patient is really not at any state where they can handle her outside the hospital. Patient is still certainly euphoric and hyperverbal.  Follow-up for Monday the 23rd. Patient seen. Spoke with hospitalist as well as nursing and nursing supervisors. Spoke with TTS. During the interview tonight the patient rapidly became belligerent again. She shouted at me and be rated me for asking her questions that I have asked her before. She insisted to me with absolute confidence that she would be able to go live at the same address she has been quoting over and over. Patient had rapid pressured speech inappropriate affect. I checked the chart and there is no indication that anyone has confirmed that she will be able to stay at  [address]. In fact it appears that her friend or family or both are going to visit tomorrow and try and make some determination of whether she is "in her right mind"  HPI:  Patient interviewed. Chart reviewed. 51 year old woman presented to the emergency room last night. She was reportedly  agitated disorganized and bizarre in her behavior even before getting it made it to the ER. Law enforcement reported that she had been urinating in public. Patient was complaining of pain in her left foot but then was only partially cooperative with evaluation of it. She was noted to be disorganized angry agitated and possibly threatening and had to be given forced medicines in the emergency room. On interview today the patient says that she just came here from [place] on Friday. Apparently she was planning to stay with someone in [place] but she says that they threw her out and told her to go to the shelter. Her story is rambling and disorganized and hard to make complete sense of. Patient indicates that she is not currently taking any psychiatric medicine. She is evasive about most specific psychiatric questions. Claims to not be having any particular problems with sleep or appetite. Denies suicidal or homicidal ideation. She says that she used to have hallucinations years ago but no longer does. She says the only psychiatric medicine she takes currently as clonazepam because it helps her to sleep. Denies that she is drinking or using any drugs although drug screen is positive for marijuana.  Social history: From what I can tell she is primarily located in [place]. She named several hospitals back there. At the same time she says that she was planning to relocate here to [facility]. Unclear exactly who she was planning to stay with.  Medical history: Has cellulitis. I'm unclear about the rest of her medical history. When I ask her about other illnesses she said yes to everything I ask. Couldn't name any specific medicines that she takes.  Substance abuse history: Denies use of alcohol says that she doesn't use any drugs denies any history of substance abuse treatment. Drug screen positive for cannabis  Past Psychiatric History: Evasive about this and we don't have  much in the way of any old records but she does indicate she's had bipolar disorder that has been diagnosed "all my life". She says she's had several hospitalizations in the past. Denies any history of suicide attempts. Admits to a history of violence. She says for years she took Depakote and Seroquel but doesn't do that anymore. She to lithium.  Risk to Self: Is patient at risk for suicide?: No (IVC at this time with 1:1 sitter) Risk to Others:   Prior Inpatient Therapy:   Prior Outpatient Therapy:    Past Medical History:  Past Medical History:  Diagnosis Date  . Bipolar 1 disorder [facility])     Past Surgical History:  Procedure Laterality Date  . AMPUTATION Left 08/28/2016   Procedure: AMPUTATION DIGIT 2nd toe;  Surgeon: Linus Galas, DPM;  Location: [facility] ORS;  Service: Podiatry;  Laterality: Left;  . AMPUTATION TOE Left 09/04/2016   Procedure: AMPUTATION TOE;  Surgeon: Linus Galas, DPM;  Location: [facility] ORS;  Service: Podiatry;  Laterality: Left;   Family History: History reviewed. No pertinent family history. Family Psychiatric  History: Does not know of any Social History:  History  Alcohol use Not on file     History  Drug use: Unknown    Social History  Social History  . Marital status: Widowed    Spouse name: N/A  . Number of children: N/A  . Years of education: N/A   Social History Main Topics  . Smoking status: Current Every Day Smoker  . Smokeless tobacco: Never Used  . Alcohol use None  . Drug use: Unknown  . Sexual activity: Not Asked   Other Topics Concern  . None   Social History Narrative  . None   Additional Social History:    Allergies:   Allergies  Allergen Reactions  . Lithium Rash    Labs:  Results for orders placed or performed during the hospital encounter of 08/25/16 (from the past 48 hour(s))  Glucose, capillary     Status: Abnormal   Collection Time: 09/08/16  9:19 PM  Result Value Ref Range   Glucose-Capillary 188 (H) 65 - 99 mg/dL    Comment 1 Notify RN   Glucose, capillary     Status: Abnormal   Collection Time: 09/09/16  7:36 AM  Result Value Ref Range   Glucose-Capillary 104 (H) 65 - 99 mg/dL   Comment 1 Notify RN   Glucose, capillary     Status: Abnormal   Collection Time: 09/09/16 11:40 AM  Result Value Ref Range   Glucose-Capillary 198 (H) 65 - 99 mg/dL   Comment 1 Notify RN   Glucose, capillary     Status: Abnormal   Collection Time: 09/09/16  4:35 PM  Result Value Ref Range   Glucose-Capillary 210 (H) 65 - 99 mg/dL   Comment 1 Notify RN   Glucose, capillary     Status: Abnormal   Collection Time: 09/09/16  9:20 PM  Result Value Ref Range   Glucose-Capillary 211 (H) 65 - 99 mg/dL   Comment 1 Notify RN   Glucose, capillary     Status: Abnormal   Collection Time: 09/10/16  7:45 AM  Result Value Ref Range   Glucose-Capillary 148 (H) 65 - 99 mg/dL  Glucose, capillary     Status: Abnormal   Collection Time: 09/10/16 11:09 AM  Result Value Ref Range   Glucose-Capillary 208 (H) 65 - 99 mg/dL  Glucose, capillary     Status: Abnormal   Collection Time: 09/10/16  4:27 PM  Result Value Ref Range   Glucose-Capillary 125 (H) 65 - 99 mg/dL    Current Facility-Administered Medications  Medication Dose Route Frequency Provider Last Rate Last Dose  . amLODipine (NORVASC) tablet 10 mg  10 mg Oral Daily Arnaldo Natal, MD   10 mg at 09/10/16 0906  . amoxicillin-clavulanate (AUGMENTIN) 875-125 MG per tablet 1 tablet  1 tablet Oral BID Enid Baas, MD   1 tablet at 09/10/16 0906  . clonazePAM (KLONOPIN) tablet 1 mg  1 mg Oral QHS Audery Amel, MD   1 mg at 09/09/16 2045  . divalproex (DEPAKOTE) DR tablet 750 mg  750 mg Oral BID Audery Amel, MD   750 mg at 09/10/16 0906  . doxycycline (VIBRA-TABS) tablet 100 mg  100 mg Oral Q12H Enid Baas, MD   100 mg at 09/10/16 0906  . enoxaparin (LOVENOX) injection 40 mg  40 mg Subcutaneous Q24H Debby Crosley, MD   40 mg at 09/09/16 2044  .  HYDROcodone-acetaminophen (NORCO/VICODIN) 5-325 MG per tablet 1 tablet  1 tablet Oral Q4H PRN Oralia Manis, MD   1 tablet at 09/10/16 0935  . HYDROcodone-acetaminophen (NORCO/VICODIN) 5-325 MG per tablet 1-2 tablet  1-2 tablet Oral Q4H PRN Linus Galas,  DPM   2 tablet at 09/10/16 1431  . insulin aspart (novoLOG) injection 0-5 Units  0-5 Units Subcutaneous QHS Alford Highland, MD   2 Units at 09/09/16 2206  . insulin aspart (novoLOG) injection 0-9 Units  0-9 Units Subcutaneous TID WC Alford Highland, MD   1 Units at 09/10/16 1725  . levothyroxine (SYNTHROID, LEVOTHROID) tablet 100 mcg  100 mcg Oral Q0600 Audery Amel, MD   100 mcg at 09/10/16 0453  . lisinopril (PRINIVIL,ZESTRIL) tablet 5 mg  5 mg Oral Daily Alford Highland, MD   5 mg at 09/10/16 0906  . LORazepam (ATIVAN) injection 2 mg  2 mg Intravenous Q6H PRN Debby Crosley, MD      . nicotine (NICODERM CQ - dosed in mg/24 hours) patch 21 mg  21 mg Transdermal Daily Houston Siren, MD   21 mg at 09/10/16 0907  . ondansetron (ZOFRAN) injection 4 mg  4 mg Intravenous Q6H PRN Enid Baas, MD      . QUEtiapine (SEROQUEL) tablet 400 mg  400 mg Oral QHS Darliss Ridgel, MD   400 mg at 09/09/16 2044    Musculoskeletal: Strength & Muscle Tone: within normal limits Gait & Station: normal Patient leans: N/A  Psychiatric Specialty Exam: Physical Exam  Nursing note and vitals reviewed. Constitutional: She appears well-developed and well-nourished.  HENT:  Head: Normocephalic and atraumatic.  Eyes: Conjunctivae are normal. Pupils are equal, round, and reactive to light.  Neck: Normal range of motion.  Cardiovascular: Regular rhythm and normal heart sounds.   Respiratory: Effort normal. No respiratory distress.  GI: Soft.  Musculoskeletal: Normal range of motion.       Feet:  Neurological: She is alert.  Skin: Skin is warm and dry.  Psychiatric: Her affect is not angry, not labile and not inappropriate. Her speech is tangential. She is not  agitated and not hyperactive. Thought content is not paranoid. She does not express impulsivity. She expresses no homicidal and no suicidal ideation. She exhibits abnormal recent memory. She is inattentive.    Review of Systems  Constitutional: Negative.   HENT: Negative.   Eyes: Negative.   Respiratory: Negative.   Cardiovascular: Negative.   Gastrointestinal: Negative.   Musculoskeletal: Negative.   Skin: Negative.   Neurological: Negative.   Psychiatric/Behavioral: Negative for depression, hallucinations, memory loss, substance abuse and suicidal ideas. The patient is not nervous/anxious and does not have insomnia.     Blood pressure 117/72, pulse (!) 104, temperature 98.2 F (36.8 C), temperature source Oral, resp. rate 16, height  (1.803 m), weight 69.9 kg (154 lb), SpO2 100 %.Body mass index is 21.48 kg/m.  General Appearance: Casual  Eye Contact:  Fair  Speech:  Garbled and Pressured  Volume:  Increased  Mood:  Euphoric and Irritable  Affect:  Inappropriate and Labile  Thought Process:  Disorganized  Orientation:  Full (Time, Place, and Person)  Thought Content:  Rumination  Suicidal Thoughts:  No  Homicidal Thoughts:  No  Memory:  Immediate;   Fair Recent;   Poor Remote;   Fair  Judgement:  Impaired  Insight:  Shallow  Psychomotor Activity:  Increased and Restlessness  Concentration:  Concentration: Poor  Recall:  Poor  Fund of Knowledge:  Poor  Language:  Fair  Akathisia:  No  Handed:  Right  AIMS (if indicated):     Assets:  Resilience  ADL's:  Intact  Cognition:  Impaired,  Mild  Sleep:        Treatment  Plan Summary: Daily contact with patient to assess and evaluate symptoms and progress in treatment, Medication management and Plan Getting better. Depakote level a little above 50. Spoke with nurses on the medical service. We are still hoping to transfer the patient to the psychiatry unit eventually. It continues to be the case that intravenous  antibiotics can be discontinued. Patient is starting to ambulate a little bit more although still only a few feet. Patient is known to TTS and still on the list for transfer. No change to psychiatric medicine today. Supportive counseling to the patient. I am renewing her involuntary commitment paperwork as it will be expiring over the weekend.  Disposition: No evidence of imminent risk to self or others at present.   Patient does not meet criteria for psychiatric inpatient admission. Supportive therapy provided about ongoing stressors.  Mordecai Rasmussen, MD 09/10/2016 7:22 PM

## 2016-09-10 NOTE — Care Management Note (Signed)
Case Management Note  Patient Details  Name: Mary Mcdonald MRN: [id number] Date of Birth: 07/12/1965  Subjective/Objective:   Met with patient at bedside and discussed where she lives (per patient: [place], [place]). She rerpots she lives with Stevphen [place] but this RNCM is unable to reach him at the number provided ([phone]). Received a message that this call could not be completed. Attempted multiple times.  Spoke with patients sister, Mignon Pine [phone]) by phone. She will have [facility] call [facility].   TC received from [place]. He provided the correct number ([phone]). He states he and the patient had a romantic relationship before patient went to [place] for 4 weeks.  I ask him if patient would be coming back to live with him and he states patient may be able to  come back if she is in her "right mind". He will be coming to the hospital tomorrow. I will be meeting with him and patienst sister at that time to further discuss a discharge plan.   Jason Fila ask that I make patient an appointment with Dr.Neimeyer so she can have her medications refilled and managed.  I told him I would.    Action/Plan: Will follow up tomorrow with MD appointment and family meeting  Expected Discharge Date:  09/08/16               Expected Discharge Plan:     In-House Referral:     Discharge planning Services  CM Consult  Post Acute Care Choice:    Choice offered to:     DME Arranged:    DME Agency:     HH Arranged:    [place] Agency:     Status of Service:     If discussed at [employer], dates discussed:    Additional Comments:  Jolly Mango, RN 09/10/2016, 4:26 PM

## 2016-09-10 NOTE — Progress Notes (Signed)
[facility] - [place] [facility]   PATIENT NAME: Mary Mcdonald    MR#:  [id number]  DATE OF BIRTH:  Jul 22, 1965  SUBJECTIVE:  CHIEF COMPLAINT:   Chief Complaint  Patient presents with  . Psychiatric Evaluation   - appears calm. Asking when she is going to behavioral medicine unit. -denies any complaints. - using the post op boot to walk  REVIEW OF SYSTEMS:  Review of Systems  Constitutional: Negative for chills, fever and malaise/fatigue.  HENT: Negative for congestion, hearing loss, nosebleeds and sinus pain.   Respiratory: Negative for cough, shortness of breath and wheezing.   Cardiovascular: Negative for chest pain, palpitations and leg swelling.  Gastrointestinal: Negative for abdominal pain, constipation, diarrhea, nausea and vomiting.  Genitourinary: Negative for dysuria.  Musculoskeletal: Positive for joint pain.  Neurological: Negative for dizziness, sensory change, speech change, focal weakness, seizures and headaches.  Psychiatric/Behavioral: Negative for depression.    DRUG ALLERGIES:   Allergies  Allergen Reactions  . Lithium Rash    VITALS:  Blood pressure 127/62, pulse 84, temperature 98.3 F (36.8 C), temperature source Oral, resp. rate 18, height  (1.803 m), weight 69.9 kg (154 lb), SpO2 100 %.  PHYSICAL EXAMINATION:  Physical Exam  GENERAL:  51 y.o.-year-old patient lying in the bed with no acute distress.  EYES: Pupils equal, round, reactive to light and accommodation. No scleral icterus. Extraocular muscles intact.  HEENT: Head atraumatic, normocephalic. Oropharynx and nasopharynx clear.  NECK:  Supple, no jugular venous distention. No thyroid enlargement, no tenderness.  LUNGS: Normal breath sounds bilaterally, no wheezing, rales,rhonchi or crepitation. No use of accessory muscles of respiration.  CARDIOVASCULAR: S1, S2 normal. No murmurs, rubs, or gallops.  ABDOMEN: Soft, nontender, nondistended. Bowel sounds present.  No organomegaly or mass.  EXTREMITIES: No pedal edema, cyanosis, or clubbing. Left foot in dressing present. NEUROLOGIC: Cranial nerves II through XII are intact. Muscle strength 5/5 in all extremities. Sensation intact. Gait not checked.  PSYCHIATRIC: The patient is alert and oriented, in commands. Appears calm SKIN: No obvious rash, lesion, or ulcer.    LABORATORY PANEL:   CBC  Recent Labs Lab 09/08/16 0439  WBC 7.1  HGB 10.6*  HCT 33.2*  PLT 271   ------------------------------------------------------------------------------------------------------------------  Chemistries   Recent Labs Lab 09/08/16 0439  NA 137  K 4.1  CL 104  CO2 27  GLUCOSE 247*  BUN 15  CREATININE 0.59  CALCIUM 9.2   ------------------------------------------------------------------------------------------------------------------  Cardiac Enzymes No results for input(s): TROPONINI in the last 168 hours. ------------------------------------------------------------------------------------------------------------------  RADIOLOGY:  No results found.  EKG:   Orders placed or performed during the hospital encounter of 08/25/16  . ED EKG 12-Lead  . ED EKG 12-Lead  . EKG 12-Lead  . EKG 12-Lead    ASSESSMENT AND PLAN:   51 year old female with past medical history significant for bipolar 1 disorder presents to hospital secondary to left foot pain and noted to have osteomyelitis in her toes.  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 -Appreciate podiatry and ID consults. -Patient received IV vancomycin and Zosyn. Changed to Augmentin and doxycycline as the source of infection has been removed. -Patient can walk with weightbearing as tolerated using postop shoes.  #2 bipolar disorder with acute maniac episode-appreciate psych consult. -Patient will be discharged to behavioral medicine unit when bed is available Cont meds  #3  hypertension-continue Norvasc and lisinopril  #4 hypothyroidism-continue Synthroid  #5 depression and anxiety-on Seroquel and  Klonopin  #7 Hyperglycemia- will restart metformin at discharge. Sugars are better controlled now.  #8 DVT Prophylaxis- lovenox  Medically stable to go to behavioral medicine unit today   All the records are reviewed and case discussed with Care Management/Social Workerr. Management plans discussed with the patient, family and they are in agreement.  CODE STATUS: Full code  TOTAL TIME TAKING CARE OF THIS PATIENT: 37 minutes.   POSSIBLE D/C today, DEPENDING ON CLINICAL CONDITION.   Enid Baas M.D on 09/10/2016 at 12:43 PM  Between 7am to 6pm - Pager - [phone]  After 6pm go to [email or web address] - Social research officer, government  [facility]  Office  [phone]  CC: Primary care physician; No PCP Per Patient

## 2016-09-11 LAB — AEROBIC/ANAEROBIC CULTURE W GRAM STAIN (SURGICAL/DEEP WOUND)

## 2016-09-11 LAB — AEROBIC/ANAEROBIC CULTURE (SURGICAL/DEEP WOUND)

## 2016-09-11 LAB — GLUCOSE, CAPILLARY
Glucose-Capillary: 210 mg/dL — ABNORMAL HIGH (ref 65–99)
Glucose-Capillary: 188 mg/dL — ABNORMAL HIGH (ref 65–99)

## 2016-09-11 MED ORDER — CLONAZEPAM 1 MG PO TABS: 1.0000 mg | ORAL_TABLET | Freq: Every day | ORAL | 0 refills | Status: AC

## 2016-09-11 MED ORDER — LEVOTHYROXINE SODIUM 100 MCG PO TABS: 100.0000 ug | ORAL_TABLET | Freq: Every day | ORAL | 2 refills | Status: AC

## 2016-09-11 MED ORDER — DOXYCYCLINE HYCLATE 50 MG PO CAPS: 100.0000 mg | ORAL_CAPSULE | Freq: Two times a day (BID) | ORAL | 0 refills | Status: AC

## 2016-09-11 MED ORDER — QUETIAPINE FUMARATE 400 MG PO TABS: 400.0000 mg | ORAL_TABLET | Freq: Every day | ORAL | 2 refills | Status: AC

## 2016-09-11 MED ORDER — DIVALPROEX SODIUM 250 MG PO DR TAB: 750.0000 mg | DELAYED_RELEASE_TABLET | Freq: Two times a day (BID) | ORAL | 2 refills | Status: AC

## 2016-09-11 MED ORDER — AMOXICILLIN-POT CLAVULANATE 875-125 MG PO TABS: 1.0000 | ORAL_TABLET | Freq: Two times a day (BID) | ORAL | 0 refills | Status: AC

## 2016-09-11 MED ORDER — HYDROCODONE-ACETAMINOPHEN 5-325 MG PO TABS: 1.0000 | ORAL_TABLET | Freq: Four times a day (QID) | ORAL | 0 refills | Status: AC | PRN

## 2016-09-11 MED ORDER — METFORMIN HCL 500 MG PO TABS: 500.0000 mg | ORAL_TABLET | Freq: Two times a day (BID) | ORAL | 2 refills | Status: AC

## 2016-09-11 MED ORDER — AMLODIPINE BESYLATE 10 MG PO TABS: 10.0000 mg | ORAL_TABLET | Freq: Every day | ORAL | 2 refills | Status: AC

## 2016-09-11 MED ORDER — BLOOD GLUCOSE MONITOR KIT: PACK | 0 refills | Status: AC

## 2016-09-11 MED ORDER — LISINOPRIL 10 MG PO TABS: 10.0000 mg | ORAL_TABLET | Freq: Every day | ORAL | 2 refills | Status: AC

## 2016-09-11 NOTE — Progress Notes (Signed)
Notified Dr. Toni Amend that Mary Mcdonald was here in the patient's room and is willing to take the patient home with him at discharge. Dr. Toni Amend acknowledged and will put in orders to remove IVC. He wants the patient to follow-up with [facility].

## 2016-09-11 NOTE — Progress Notes (Signed)
Patient is alert and oriented and able to verbalize needs. No complaints of pain at this time. Family at bedside. Vital signs stable. PIV removed. Discharge paperwork gone over with patient and family. Hard scripts for all medications and printed AVS given to patient. Patient and family verbalizes understanding of all follow up care and discharge instructions. Family will transport patient home.

## 2016-09-11 NOTE — Progress Notes (Signed)
Notified Dr. Nemiah Commander that Dr. Toni Amend was okay with discharge to home with [facility]. Also informed Dr. Nemiah Commander that patient needs a note for court her court date in [place].

## 2016-09-11 NOTE — Care Management Note (Addendum)
Case Management Note  Patient Details  Name: Loa Idler MRN: [id number] Date of Birth: 1966-02-24  Subjective/Objective:    Appointment scheduled with Dr. Bernita Buffy for Tues, May 1,  1:45 pm . Jason Fila and patient updated.      Met with patient, Jason Fila and her cousin Carlyon Shadow at bedside to discuss discharge planning. Jason Fila was clear that he would be taking patient home with him. Updated him on patient's new PCP appointment at his request. He ask for a nursing for dressing changes but I told him that Dr.Cline did not want the dressing changed until he saw her in his office next week. Dr. Weber Cooks aware that family is here per assist DON, Tiffany. Informed patient that she could be discharged as early as this evening. All were in agreement patient was ready and they could take her home today if necessary.             Action/Plan:   Expected Discharge Date:  09/08/16               Expected Discharge Plan:     In-House Referral:     Discharge planning Services  CM Consult  Post Acute Care Choice:    Choice offered to:     DME Arranged:    DME Agency:     HH Arranged:    HH Agency:     Status of Service:  In process, will continue to follow  If discussed at Long Length of Stay Meetings, dates discussed:    Additional Comments:  Jolly Mango, RN 09/11/2016, 2:13 PM

## 2016-09-11 NOTE — Progress Notes (Signed)
Clinical Child psychotherapist (CSW) gave [employer] to patient's significant other Kerby Nora. CSW made Kerby Nora aware that MD would like for patient to follow up with [facility] outpatient mental health clinic. Kerby Nora verbalized his understanding. Please reconsult if future social work needs arise. CSW signing off.   [employer], LCSW [phone]

## 2016-09-11 NOTE — Consult Note (Signed)
[facility] Face-to-Face Psychiatry Consult   Reason for Consult:  Consult for 51 year old woman who presented to the emergency room last night allegedly for pain in her foot but who was clearly agitated psychotic and showing manic symptoms. Referring Physician:  Renae Gloss Patient Identification: Mary Mcdonald MRN:  [id number] Principal Diagnosis: Bipolar I disorder, most recent episode (or current) manic (HCC) Diagnosis:   Patient Active Problem List   Diagnosis Date Noted  . Bipolar I disorder, most recent episode (or current) manic (HCC) [F31.10] 08/26/2016  . Cellulitis [L03.90] 08/25/2016    Total Time spent with patient: 20 minutes  Subjective:   Mary Mcdonald is a 51 y.o. female patient admitted with "I just came from [place]".  Follow-up for Monday the 16th. Patient seen. Chart reviewed. Patient was calm and reasonably pleasant even though she now understands that there had been a plan in place to have her transferred to the psychiatric unit. Patient is difficult to understand. Seems to be a little bit sedated and confused in her speech. Nevertheless she does basically understand her medical plan. She told me that there was a plan to amputate her great toe tomorrow and that Dr. Graciela Husbands had given her this information. Chart shows that she is absolutely correct about that. Appears to be tolerating her medicine reasonably well. Still has a bit of a euphoric affect.  Follow-up for Wednesday the 18th. Patient seen chart reviewed. She had an amputation done yesterday. She tells me she is having some pain but her pain medicine currently is adequate. Patient initially had a somewhat reasonable conversation but quickly to send it into being more incoherent and angry and agitated than previously. Refuses to discuss with any rationality any plan for outpatient treatment. Mood still angry. No sign of any side effects from medicine.  Follow-up for Thursday the 19th. Patient seen. She was significantly calmer  today. We were able to have a calm and polite conversation. Patient even answered my questions about discharge with only slight irritation. Continues to be a little bit hyper but may possibly be getting some benefit from the medicine. No side effects. Pain appears to be reasonably controlled. She appears to be completely cooperative and appropriate with the medical situation.  Follow-up Friday, April 20. Patient seen. Chart reviewed. Spoke with nursing on the ward. I also got to speak to the patient's sister on the telephone. Patient herself has no new complaints. She feels like she is generally getting better. She says she is sleeping a little bit better and has no complaints about her pain. Her affect continues to be pleasant and fairly calm. I spoke with her sister who tells me that everyone in the family feels the patient needs to go downstairs to the psychiatry ward. Everyone in the family feels the patient is really not at any state where they can handle her outside the hospital. Patient is still certainly euphoric and hyperverbal.  Follow-up for Monday the 23rd. Patient seen. Spoke with hospitalist as well as nursing and nursing supervisors. Spoke with TTS. During the interview tonight the patient rapidly became belligerent again. She shouted at me and be rated me for asking her questions that I have asked her before. She insisted to me with absolute confidence that she would be able to go live at the same address she has been quoting over and over. Patient had rapid pressured speech inappropriate affect. I checked the chart and there is no indication that anyone has confirmed that she will be able to stay at  [address]. In fact it appears that her friend or family or both are going to visit tomorrow and try and make some determination of whether she is "in her right mind"  Follow-up for Tuesday the 24th. The patient's family and her friend have visited and have said they are willing to take her home.  They apparently feel comparable with her current mental state. Patient has been compliant with her medication. Although she still tends to be hyperverbal and irritable she has not been violent or threatening or shown any indication of suicidality since being in the hospital. Patient is experienced with her illness and appears to be agreeable to medication. It is very unlikely that we would ever be able to get her admitted downstairs at this point.  HPI:  Patient interviewed. Chart reviewed. 51 year old woman presented to the emergency room last night. She was reportedly agitated disorganized and bizarre in her behavior even before getting it made it to the ER. Law enforcement reported that she had been urinating in public. Patient was complaining of pain in her left foot but then was only partially cooperative with evaluation of it. She was noted to be disorganized angry agitated and possibly threatening and had to be given forced medicines in the emergency room. On interview today the patient says that she just came here from [place] on Friday. Apparently she was planning to stay with someone in [place] but she says that they threw her out and told her to go to the shelter. Her story is rambling and disorganized and hard to make complete sense of. Patient indicates that she is not currently taking any psychiatric medicine. She is evasive about most specific psychiatric questions. Claims to not be having any particular problems with sleep or appetite. Denies suicidal or homicidal ideation. She says that she used to have hallucinations years ago but no longer does. She says the only psychiatric medicine she takes currently as clonazepam because it helps her to sleep. Denies that she is drinking or using any drugs although drug screen is positive for marijuana.  Social history: From what I can tell she is primarily located in [place]. She named several hospitals back there. At  the same time she says that she was planning to relocate here to [facility]. Unclear exactly who she was planning to stay with.  Medical history: Has cellulitis. I'm unclear about the rest of her medical history. When I ask her about other illnesses she said yes to everything I ask. Couldn't name any specific medicines that she takes.  Substance abuse history: Denies use of alcohol says that she doesn't use any drugs denies any history of substance abuse treatment. Drug screen positive for cannabis  Past Psychiatric History: Evasive about this and we don't have much in the way of any old records but she does indicate she's had bipolar disorder that has been diagnosed "all my life". She says she's had several hospitalizations in the past. Denies any history of suicide attempts. Admits to a history of violence. She says for years she took Depakote and Seroquel but doesn't do that anymore. She to lithium.  Risk to Self: Is patient at risk for suicide?: No (IVC at this time with 1:1 sitter) Risk to Others:   Prior Inpatient Therapy:   Prior Outpatient Therapy:    Past Medical History:  Past Medical History:  Diagnosis Date  . Bipolar 1 disorder [facility])     Past Surgical History:  Procedure  Laterality Date  . AMPUTATION Left 08/28/2016   Procedure: AMPUTATION DIGIT 2nd toe;  Surgeon: Linus Galas, DPM;  Location: [facility] ORS;  Service: Podiatry;  Laterality: Left;  . AMPUTATION TOE Left 09/04/2016   Procedure: AMPUTATION TOE;  Surgeon: Linus Galas, DPM;  Location: [facility] ORS;  Service: Podiatry;  Laterality: Left;   Family History: History reviewed. No pertinent family history. Family Psychiatric  History: Does not know of any Social History:  History  Alcohol use Not on file     History  Drug use: Unknown    Social History   Social History  . Marital status: Widowed    Spouse name: N/A  . Number of children: N/A  . Years of education: N/A   Social History Main Topics  . Smoking status:  Current Every Day Smoker  . Smokeless tobacco: Never Used  . Alcohol use None  . Drug use: Unknown  . Sexual activity: Not Asked   Other Topics Concern  . None   Social History Narrative  . None   Additional Social History:    Allergies:   Allergies  Allergen Reactions  . Lithium Rash    Labs:  Results for orders placed or performed during the hospital encounter of 08/25/16 (from the past 48 hour(s))  Glucose, capillary     Status: Abnormal   Collection Time: 09/09/16  4:35 PM  Result Value Ref Range   Glucose-Capillary 210 (H) 65 - 99 mg/dL   Comment 1 Notify RN   Glucose, capillary     Status: Abnormal   Collection Time: 09/09/16  9:20 PM  Result Value Ref Range   Glucose-Capillary 211 (H) 65 - 99 mg/dL   Comment 1 Notify RN   Glucose, capillary     Status: Abnormal   Collection Time: 09/10/16  7:45 AM  Result Value Ref Range   Glucose-Capillary 148 (H) 65 - 99 mg/dL  Glucose, capillary     Status: Abnormal   Collection Time: 09/10/16 11:09 AM  Result Value Ref Range   Glucose-Capillary 208 (H) 65 - 99 mg/dL  Glucose, capillary     Status: Abnormal   Collection Time: 09/10/16  4:27 PM  Result Value Ref Range   Glucose-Capillary 125 (H) 65 - 99 mg/dL  Glucose, capillary     Status: Abnormal   Collection Time: 09/10/16  9:01 PM  Result Value Ref Range   Glucose-Capillary 185 (H) 65 - 99 mg/dL   Comment 1 Notify RN   Glucose, capillary     Status: Abnormal   Collection Time: 09/11/16  7:31 AM  Result Value Ref Range   Glucose-Capillary 210 (H) 65 - 99 mg/dL  Glucose, capillary     Status: Abnormal   Collection Time: 09/11/16 11:17 AM  Result Value Ref Range   Glucose-Capillary 188 (H) 65 - 99 mg/dL    Current Facility-Administered Medications  Medication Dose Route Frequency Provider Last Rate Last Dose  . amLODipine (NORVASC) tablet 10 mg  10 mg Oral Daily Arnaldo Natal, MD   10 mg at 09/11/16 0810  . amoxicillin-clavulanate (AUGMENTIN) 875-125 MG  per tablet 1 tablet  1 tablet Oral BID Enid Baas, MD   1 tablet at 09/11/16 0810  . clonazePAM (KLONOPIN) tablet 1 mg  1 mg Oral QHS Audery Amel, MD   1 mg at 09/10/16 2136  . divalproex (DEPAKOTE) DR tablet 750 mg  750 mg Oral BID Audery Amel, MD   750 mg at 09/11/16 0810  .  doxycycline (VIBRA-TABS) tablet 100 mg  100 mg Oral Q12H Enid Baas, MD   100 mg at 09/11/16 0809  . enoxaparin (LOVENOX) injection 40 mg  40 mg Subcutaneous Q24H Debby Crosley, MD   40 mg at 09/10/16 2137  . HYDROcodone-acetaminophen (NORCO/VICODIN) 5-325 MG per tablet 1 tablet  1 tablet Oral Q4H PRN Oralia Manis, MD   1 tablet at 09/10/16 0935  . HYDROcodone-acetaminophen (NORCO/VICODIN) 5-325 MG per tablet 1-2 tablet  1-2 tablet Oral Q4H PRN Linus Galas, DPM   2 tablet at 09/11/16 0831  . insulin aspart (novoLOG) injection 0-5 Units  0-5 Units Subcutaneous QHS Alford Highland, MD   2 Units at 09/09/16 2206  . insulin aspart (novoLOG) injection 0-9 Units  0-9 Units Subcutaneous TID WC Alford Highland, MD   2 Units at 09/11/16 1156  . levothyroxine (SYNTHROID, LEVOTHROID) tablet 100 mcg  100 mcg Oral Q0600 Audery Amel, MD   100 mcg at 09/10/16 0453  . lisinopril (PRINIVIL,ZESTRIL) tablet 5 mg  5 mg Oral Daily Alford Highland, MD   5 mg at 09/11/16 0810  . LORazepam (ATIVAN) injection 2 mg  2 mg Intravenous Q6H PRN Debby Crosley, MD      . nicotine (NICODERM CQ - dosed in mg/24 hours) patch 21 mg  21 mg Transdermal Daily Houston Siren, MD   21 mg at 09/11/16 0809  . ondansetron (ZOFRAN) injection 4 mg  4 mg Intravenous Q6H PRN Enid Baas, MD      . QUEtiapine (SEROQUEL) tablet 400 mg  400 mg Oral QHS Darliss Ridgel, MD   400 mg at 09/10/16 2136    Musculoskeletal: Strength & Muscle Tone: within normal limits Gait & Station: normal Patient leans: N/A  Psychiatric Specialty Exam: Physical Exam  Nursing note and vitals reviewed. Constitutional: She appears well-developed and well-nourished.    HENT:  Head: Normocephalic and atraumatic.  Eyes: Conjunctivae are normal. Pupils are equal, round, and reactive to light.  Neck: Normal range of motion.  Cardiovascular: Regular rhythm and normal heart sounds.   Respiratory: Effort normal. No respiratory distress.  GI: Soft.  Musculoskeletal: Normal range of motion.       Feet:  Neurological: She is alert.  Skin: Skin is warm and dry.  Psychiatric: Her affect is not angry, not labile and not inappropriate. Her speech is tangential. She is not agitated and not hyperactive. Thought content is not paranoid. She does not express impulsivity. She expresses no homicidal and no suicidal ideation. She exhibits abnormal recent memory. She is inattentive.    Review of Systems  Constitutional: Negative.   HENT: Negative.   Eyes: Negative.   Respiratory: Negative.   Cardiovascular: Negative.   Gastrointestinal: Negative.   Musculoskeletal: Negative.   Skin: Negative.   Neurological: Negative.   Psychiatric/Behavioral: Negative for depression, hallucinations, memory loss, substance abuse and suicidal ideas. The patient is not nervous/anxious and does not have insomnia.     Blood pressure 131/80, pulse 91, temperature 98.8 F (37.1 C), temperature source Oral, resp. rate 18, height 5\' 11"  (1.803 m), weight 69.9 kg (154 lb), SpO2 100 %.Body mass index is 21.48 kg/m.  General Appearance: Casual  Eye Contact:  Fair  Speech:  Garbled and Pressured  Volume:  Increased  Mood:  Euphoric and Irritable  Affect:  Inappropriate and Labile  Thought Process:  Disorganized  Orientation:  Full (Time, Place, and Person)  Thought Content:  Rumination  Suicidal Thoughts:  No  Homicidal Thoughts:  No  Memory:  Immediate;   Fair Recent;   Poor Remote;   Fair  Judgement:  Impaired  Insight:  Shallow  Psychomotor Activity:  Increased and Restlessness  Concentration:  Concentration: Poor  Recall:  Poor  Fund of Knowledge:  Poor  Language:  Fair   Akathisia:  No  Handed:  Right  AIMS (if indicated):     Assets:  Resilience  ADL's:  Intact  Cognition:  Impaired,  Mild  Sleep:        Treatment Plan Summary: Daily contact with patient to assess and evaluate symptoms and progress in treatment, Medication management and Plan Although she continues to show significant signs of manic illness she has been compliant with medicine at this point does not appear to be acutely dangerous. Now that she has an appropriate discharge plan with a place to live safely it appears more appropriate to have her released from the hospital. We are very unlikely to ever be able to take her downstairs the way things of been going so at this point it is probably better to get her out of the hospital rather than force her to stay here and just be miserable. Patient is aware she needs to continue her medicine. Case reviewed with treatment team. I have discontinued her involuntary commitment. She needs to be referred to [facility] if she will stay here in the community to make sure she has appropriate outpatient treatment and should continue her Seroquel and Depakote.  Disposition: No evidence of imminent risk to self or others at present.   Patient does not meet criteria for psychiatric inpatient admission. Supportive therapy provided about ongoing stressors.  Mordecai Rasmussen, MD 09/11/2016 2:46 PM

## 2016-09-11 NOTE — Progress Notes (Signed)
7 Days Post-Op  Subjective: Patient seen. Doing well. Looking forward to going home.  Objective: Vital signs in last 24 hours: Temp:  [98.2 F (36.8 C)-98.9 F (37.2 C)] 98.8 F (37.1 C) (04/24 0809) Pulse Rate:  [89-104] 91 (04/24 0809) Resp:  [16-18] 18 (04/24 0809) BP: (117-161)/(65-80) 131/80 (04/24 0809) SpO2:  [100 %] 100 % (04/24 0809) Last BM Date: 09/10/16  Intake/Output from previous day: 04/23 0701 - 04/24 0700 In: 840 [P.O.:840] Out: -  Intake/Output this shift: No intake/output data recorded.  The bandage on the left foot is dry and intact. No evidence of any drainage. Upon removal both incisions are well coapted. Incision of the second toe amputation site has completely healed.  Lab Results:  No results for input(s): WBC, HGB, HCT, PLT in the last 72 hours. BMET No results for input(s): NA, K, CL, CO2, GLUCOSE, BUN, CREATININE, CALCIUM in the last 72 hours. PT/INR No results for input(s): LABPROT, INR in the last 72 hours. ABG No results for input(s): PHART, HCO3 in the last 72 hours.  Invalid input(s): PCO2, PO2  Studies/Results: No results found.  Anti-infectives: Anti-infectives    Start     Dose/Rate Route Frequency Ordered Stop   09/11/16 0000  amoxicillin-clavulanate (AUGMENTIN) 875-125 MG tablet     1 tablet Oral Every 12 hours 09/11/16 1127     09/11/16 0000  doxycycline (VIBRAMYCIN) 50 MG capsule     100 mg Oral 2 times daily 09/11/16 1127     09/09/16 1400  doxycycline (VIBRA-TABS) tablet 100 mg     100 mg Oral Every 12 hours 09/09/16 1154     09/09/16 1200  amoxicillin-clavulanate (AUGMENTIN) 875-125 MG per tablet 1 tablet     1 tablet Oral 2 times daily 09/09/16 1154     09/07/16 0000  amoxicillin-clavulanate (AUGMENTIN) 875-125 MG tablet  Status:  Discontinued     1 tablet Oral Every 12 hours 09/07/16 1134 09/11/16    09/07/16 0000  doxycycline (VIBRAMYCIN) 50 MG capsule  Status:  Discontinued     100 mg Oral 2 times daily 09/07/16 1134  09/11/16    09/06/16 0000  doxycycline (VIBRAMYCIN) 50 MG capsule  Status:  Discontinued     100 mg Oral 2 times daily 09/06/16 1104 09/07/16    09/06/16 0000  amoxicillin-clavulanate (AUGMENTIN) 875-125 MG tablet  Status:  Discontinued     1 tablet Oral Every 12 hours 09/06/16 1104 09/07/16    09/05/16 0200  vancomycin (VANCOCIN) 1,250 mg in sodium chloride 0.9 % 250 mL IVPB  Status:  Discontinued     1,250 mg 166.7 mL/hr over 90 Minutes Intravenous Every 12 hours 09/05/16 0040 09/09/16 1154   09/02/16 1200  vancomycin (VANCOCIN) IVPB 1000 mg/200 mL premix  Status:  Discontinued     1,000 mg 200 mL/hr over 60 Minutes Intravenous Every 12 hours 09/02/16 1029 09/05/16 0040   08/31/16 1315  amoxicillin-clavulanate (AUGMENTIN) 875-125 MG per tablet 1 tablet  Status:  Discontinued     1 tablet Oral Every 12 hours 08/31/16 1311 08/31/16 1901   08/31/16 0000  amoxicillin-clavulanate (AUGMENTIN) 875-125 MG tablet  Status:  Discontinued     1 tablet Oral Every 12 hours 08/31/16 1319 09/06/16    08/30/16 1800  vancomycin (VANCOCIN) IVPB 750 mg/150 ml premix  Status:  Discontinued     750 mg 150 mL/hr over 60 Minutes Intravenous Every 8 hours 08/30/16 1615 09/02/16 1029   08/30/16 1600  vancomycin (VANCOCIN) 1,250 mg in sodium  chloride 0.9 % 250 mL IVPB  Status:  Discontinued     1,250 mg 166.7 mL/hr over 90 Minutes Intravenous Every 8 hours 08/30/16 1555 08/30/16 1603   08/30/16 0000  amoxicillin-clavulanate (AUGMENTIN) 875-125 MG tablet  Status:  Discontinued     1 tablet Oral 2 times daily 08/30/16 1044 08/31/16    08/30/16 0000  doxycycline (VIBRAMYCIN) 50 MG capsule  Status:  Discontinued     100 mg Oral 2 times daily 08/30/16 1044 09/06/16    08/28/16 1230  piperacillin-tazobactam (ZOSYN) IVPB 3.375 g     3.375 g 100 mL/hr over 30 Minutes Intravenous  Once 08/28/16 1231 08/28/16 1302   08/27/16 0800  vancomycin (VANCOCIN) 1,250 mg in sodium chloride 0.9 % 250 mL IVPB  Status:  Discontinued      1,250 mg 166.7 mL/hr over 90 Minutes Intravenous Every 8 hours 08/27/16 0751 08/30/16 1000   08/26/16 0100  piperacillin-tazobactam (ZOSYN) IVPB 3.375 g  Status:  Discontinued     3.375 g 12.5 mL/hr over 240 Minutes Intravenous Every 8 hours 08/25/16 1652 09/09/16 1154   08/25/16 2300  vancomycin (VANCOCIN) IVPB 1000 mg/200 mL premix  Status:  Discontinued     1,000 mg 200 mL/hr over 60 Minutes Intravenous Every 8 hours 08/25/16 2129 08/27/16 0751   08/25/16 2200  vancomycin (VANCOCIN) IVPB 1000 mg/200 mL premix  Status:  Discontinued     1,000 mg 200 mL/hr over 60 Minutes Intravenous Every 12 hours 08/25/16 1652 08/25/16 2056   08/25/16 1545  piperacillin-tazobactam (ZOSYN) IVPB 3.375 g     3.375 g 100 mL/hr over 30 Minutes Intravenous  Once 08/25/16 1530 08/25/16 1710   08/25/16 1545  vancomycin (VANCOCIN) IVPB 1000 mg/200 mL premix     1,000 mg 200 mL/hr over 60 Minutes Intravenous  Once 08/25/16 1530 08/25/16 1818      Assessment/Plan: s/p Procedure(s): AMPUTATION TOE (Left) Assessment: Osteomyelitis status post amputation left first and second toes, stable and healing well.   Plan: Sutures were removed from the second toe amputation site. Betadine and a sterile bandage reapplied to the left foot. She will keep this dry and intact and follow up in the office in 1 week for suture removal from the great toe amputation site.  LOS: 17 days    Ricci Barker 09/11/2016

## 2016-09-11 NOTE — Progress Notes (Signed)
Pt alert and oriented. Medicated for pain during the night. Ambulating around room without difficulty, sitter at bedside. Awaiting discharge to behavioral health unit.

## 2016-09-11 NOTE — Progress Notes (Signed)
[facility] [place] [facility]   PATIENT NAME: Mary Mcdonald    MR#:  [id number]  DATE OF BIRTH:  Jan 02, 1966  SUBJECTIVE:  CHIEF COMPLAINT:   Chief Complaint  Patient presents with  . Psychiatric Evaluation   - Feels better today. Pleasant. Awaiting family meeting. Anticipate discharge today if cleared by psychiatry.  REVIEW OF SYSTEMS:  Review of Systems  Constitutional: Negative for chills, fever and malaise/fatigue.  HENT: Negative for congestion, hearing loss, nosebleeds and sinus pain.   Respiratory: Negative for cough, shortness of breath and wheezing.   Cardiovascular: Negative for chest pain, palpitations and leg swelling.  Gastrointestinal: Negative for abdominal pain, constipation, diarrhea, nausea and vomiting.  Genitourinary: Negative for dysuria.  Musculoskeletal: Positive for joint pain.  Neurological: Negative for dizziness, sensory change, speech change, focal weakness, seizures and headaches.  Psychiatric/Behavioral: Negative for depression.    DRUG ALLERGIES:   Allergies  Allergen Reactions  . Lithium Rash    VITALS:  Blood pressure 131/80, pulse 91, temperature 98.8 F (37.1 C), temperature source Oral, resp. rate 18, height  (1.803 m), weight 69.9 kg (154 lb), SpO2 100 %.  PHYSICAL EXAMINATION:  Physical Exam  GENERAL:  51 y.o.-year-old patient lying in the bed with no acute distress.  EYES: Pupils equal, round, reactive to light and accommodation. No scleral icterus. Extraocular muscles intact.  HEENT: Head atraumatic, normocephalic. Oropharynx and nasopharynx clear.  NECK:  Supple, no jugular venous distention. No thyroid enlargement, no tenderness.  LUNGS: Normal breath sounds bilaterally, no wheezing, rales,rhonchi or crepitation. No use of accessory muscles of respiration.  CARDIOVASCULAR: S1, S2 normal. No murmurs, rubs, or gallops.  ABDOMEN: Soft, nontender, nondistended. Bowel sounds present. No organomegaly or  mass.  EXTREMITIES: No pedal edema, cyanosis, or clubbing. Left foot in dressing present. NEUROLOGIC: Cranial nerves II through XII are intact. Muscle strength 5/5 in all extremities. Sensation intact. Gait not checked.  PSYCHIATRIC: The patient is alert and oriented, in commands. Appears calm SKIN: No obvious rash, lesion, or ulcer.    LABORATORY PANEL:   CBC  Recent Labs Lab 09/08/16 0439  WBC 7.1  HGB 10.6*  HCT 33.2*  PLT 271   ------------------------------------------------------------------------------------------------------------------  Chemistries   Recent Labs Lab 09/08/16 0439  NA 137  K 4.1  CL 104  CO2 27  GLUCOSE 247*  BUN 15  CREATININE 0.59  CALCIUM 9.2   ------------------------------------------------------------------------------------------------------------------  Cardiac Enzymes No results for input(s): TROPONINI in the last 168 hours. ------------------------------------------------------------------------------------------------------------------  RADIOLOGY:  No results found.  EKG:   Orders placed or performed during the hospital encounter of 08/25/16  . ED EKG 12-Lead  . ED EKG 12-Lead  . EKG 12-Lead  . EKG 12-Lead    ASSESSMENT AND PLAN:   51 year old female with past medical history significant for bipolar 1 disorder presents to hospital secondary to left foot pain and noted to have osteomyelitis in her toes.  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 -Appreciate podiatry and ID consults. -Patient received IV vancomycin and Zosyn. Changed to Augmentin and doxycycline as the source of infection has been removed. -Patient can walk with weightbearing as tolerated using postop shoes. -Discussed with podiatry, her second toe sutures will be removed today. She will follow up with podiatry in 1 week for removal of a store sutures.  #2 bipolar disorder with acute maniac episode-appreciate  psych consult. -Patient is stable according to psychiatry. If they do not recommend inpatient admission,  advise on outpatient medications and follow-up. -Continue Depakote and Seroquel  #3 hypertension-continue Norvasc and lisinopril  #4 hypothyroidism-continue Synthroid  #5 depression and anxiety-on Seroquel and Klonopin  #7 Hyperglycemia-  restart metformin at discharge. Sugars are better controlled now.  #8 DVT Prophylaxis- lovenox    All the records are reviewed and case discussed with Care Management/Social Workerr. Management plans discussed with the patient, family and they are in agreement.  CODE STATUS: Full code  TOTAL TIME TAKING CARE OF THIS PATIENT: 37 minutes.   POSSIBLE D/C today, DEPENDING ON CLINICAL CONDITION.   Enid Baas M.D on 09/11/2016 at 11:16 AM  Between 7am to 6pm - Pager - [phone]  After 6pm go to [email or web address] - Social research officer, government  [facility]  Office  [phone]  CC: Primary care physician; No PCP Per Patient

## 2016-09-11 NOTE — Progress Notes (Addendum)
     Mary Mcdonald was admitted to the [facility] on 08/25/2016 for an acute medical condition and is being Discharged on  09/11/2016 . She has follow up appointments to go to especially to take care of her surgical sutures. One of the appointments is on May 1st 2018.   So, please excuse her from the court date this time.  Call Enid Baas  MD, [facility] at  [phone] with questions.  Enid Baas M.D on 09/11/2016,at 2:39 PM  [facility] [address], [place] [address]

## 2016-09-12 NOTE — Discharge Summary (Signed)
Mary Mcdonald at [place] NAME: Mary Mcdonald    MR#:  [id number]  DATE OF BIRTH:  Aug 09, 1965  DATE OF ADMISSION:  08/25/2016   ADMITTING PHYSICIAN: Quintella Baton, MD  DATE OF DISCHARGE: 09/11/2016  4:24 PM  PRIMARY CARE PHYSICIAN: No PCP Per Patient   ADMISSION DIAGNOSIS:   Swelling [R60.9] Closed displaced fracture of proximal phalanx of left great toe, initial encounter [S92.412A] Cellulitis of lower extremity, unspecified laterality [C62.376] Psychosis, unspecified psychosis type [F29]  DISCHARGE DIAGNOSIS:   Principal Problem:   Bipolar I disorder, most recent episode (or current) manic ([place]) Active Problems:   Cellulitis   SECONDARY DIAGNOSIS:   Past Medical History:  Diagnosis Date  . Bipolar 1 disorder [facility])     HOSPITAL COURSE:   51 year old female with past medical history significant for bipolar 1 disorder presents to hospital secondary to left foot pain and noted to have osteomyelitis in her toes.  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 -Appreciate podiatry and ID consults. -Patient received IV vancomycin and Zosyn. Changed to Augmentin and doxycycline as the source of infection has been removed. -Patient can walk with weightbearing as tolerated using postop shoes. -Discussed with podiatry, her second toe sutures will be removed prior to discharge. She will follow up with podiatry in 1 week for removal of the other sutures.  #2 bipolar disorder with acute maniac episode-appreciate psych consult. -Patient is stable according to psychiatry. advise on outpatient medications and follow-up. -Continue Depakote and Seroquel  #3 hypertension-continue Norvasc and lisinopril  #4 hypothyroidism-continue Synthroid  #5 depression and anxiety-on Seroquel and Klonopin  #7 Hyperglycemia-  restarted metformin at discharge. Sugars are better controlled now.   DISCHARGE  CONDITIONS:   Guarded  CONSULTS OBTAINED:   Treatment Team:  Albertine Patricia, DPM Gonzella Lex, MD Epifanio Lesches, MD  DRUG ALLERGIES:   Allergies  Allergen Reactions  . Lithium Rash   DISCHARGE MEDICATIONS:   Allergies as of 09/11/2016      Reactions   Lithium Rash      Medication List    TAKE these medications   amLODipine 10 MG tablet Commonly known as:  NORVASC Take 1 tablet (10 mg total) by mouth daily.   amoxicillin-clavulanate 875-125 MG tablet Commonly known as:  AUGMENTIN Take 1 tablet by mouth every 12 (twelve) hours. X 2 weeks   blood glucose meter kit and supplies Kit Dispense based on patient and insurance preference. Use up to four times daily as directed. (FOR ICD-9 250.00, 250.01).  Please dispense lancets and strips as well   clonazePAM 1 MG tablet Commonly known as:  KLONOPIN Take 1 tablet (1 mg total) by mouth at bedtime. What changed:  medication strength  how much to take  when to take this   divalproex 250 MG DR tablet Commonly known as:  DEPAKOTE Take 3 tablets (750 mg total) by mouth 2 (two) times daily.   doxycycline 50 MG capsule Commonly known as:  VIBRAMYCIN Take 2 capsules (100 mg total) by mouth 2 (two) times daily. X 2 weeks   HYDROcodone-acetaminophen 5-325 MG tablet Commonly known as:  NORCO/VICODIN Take 1 tablet by mouth every 6 (six) hours as needed for moderate pain or severe pain.   levothyroxine 100 MCG tablet Commonly known as:  SYNTHROID, LEVOTHROID Take 1 tablet (100 mcg total) by mouth daily before breakfast.   lisinopril 10 MG tablet Commonly known as:  PRINIVIL,ZESTRIL Take 1  tablet (10 mg total) by mouth daily. What changed:  medication strength  how much to take   metFORMIN 500 MG tablet Commonly known as:  GLUCOPHAGE Take 1 tablet (500 mg total) by mouth 2 (two) times daily with a meal.   QUEtiapine 400 MG tablet Commonly known as:  SEROQUEL Take 1 tablet (400 mg total) by mouth at  bedtime.        DISCHARGE INSTRUCTIONS:   1. PCP f/u in 1-2 weeks 2. Psychiatry f/u in 1 week 3. Podiatry f/u in 1 week  DIET:   Cardiac diet  ACTIVITY:   Activity as tolerated  OXYGEN:   Home Oxygen: No.  Oxygen Delivery: room air  DISCHARGE LOCATION:   home   If you experience worsening of your admission symptoms, develop shortness of breath, life threatening emergency, suicidal or homicidal thoughts you must seek medical attention immediately by calling 911 or calling your MD immediately  if symptoms less severe.  You Must read complete instructions/literature along with all the possible adverse reactions/side effects for all the Medicines you take and that have been prescribed to you. Take any new Medicines after you have completely understood and accpet all the possible adverse reactions/side effects.   Please note  You were cared for by a hospitalist during your hospital stay. If you have any questions about your discharge medications or the care you received while you were in the hospital after you are discharged, you can call the unit and asked to speak with the hospitalist on call if the hospitalist that took care of you is not available. Once you are discharged, your primary care physician will handle any further medical issues. Please note that NO REFILLS for any discharge medications will be authorized once you are discharged, as it is imperative that you return to your primary care physician (or establish a relationship with a primary care physician if you do not have one) for your aftercare needs so that they can reassess your need for medications and monitor your lab values.    On the day of Discharge:  VITAL SIGNS:   Blood pressure 131/80, pulse 91, temperature 98.8 F (37.1 C), temperature source Oral, resp. rate 18, height 5' 11"  (1.803 m), weight 69.9 kg (154 lb), SpO2 100 %.  PHYSICAL EXAMINATION:   GENERAL:  51 y.o.-year-old patient lying in the bed  with no acute distress.  EYES: Pupils equal, round, reactive to light and accommodation. No scleral icterus. Extraocular muscles intact.  HEENT: Head atraumatic, normocephalic. Oropharynx and nasopharynx clear.  NECK:  Supple, no jugular venous distention. No thyroid enlargement, no tenderness.  LUNGS: Normal breath sounds bilaterally, no wheezing, rales,rhonchi or crepitation. No use of accessory muscles of respiration.  CARDIOVASCULAR: S1, S2 normal. No murmurs, rubs, or gallops.  ABDOMEN: Soft, nontender, nondistended. Bowel sounds present. No organomegaly or mass.  EXTREMITIES: No pedal edema, cyanosis, or clubbing. Left foot in dressing present. NEUROLOGIC: Cranial nerves II through XII are intact. Muscle strength 5/5 in all extremities. Sensation intact. Gait not checked.  PSYCHIATRIC: The patient is alert and oriented, in commands. Appears calm SKIN: No obvious rash, lesion, or ulcer.  DATA REVIEW:   CBC  Recent Labs Lab 09/08/16 0439  WBC 7.1  HGB 10.6*  HCT 33.2*  PLT 271    Chemistries   Recent Labs Lab 09/08/16 0439  NA 137  K 4.1  CL 104  CO2 27  GLUCOSE 247*  BUN 15  CREATININE 0.59  CALCIUM 9.2  Microbiology Results  Results for orders placed or performed during the hospital encounter of 08/25/16  Blood Culture (routine x 2)     Status: None   Collection Time: 08/25/16  4:04 PM  Result Value Ref Range Status   Specimen Description BLOOD  L AC  Final   Special Requests BOTTLES DRAWN AEROBIC AND ANAEROBIC  BCAV  Final   Culture NO GROWTH 5 DAYS  Final   Report Status 08/30/2016 FINAL  Final  Blood Culture (routine x 2)     Status: None   Collection Time: 08/25/16  4:04 PM  Result Value Ref Range Status   Specimen Description BLOOD R HAND  Final   Special Requests BOTTLES DRAWN AEROBIC AND ANAEROBIC  BCAV  Final   Culture NO GROWTH 5 DAYS  Final   Report Status 08/30/2016 FINAL  Final  Urine culture     Status: Abnormal   Collection Time:  08/26/16 12:30 AM  Result Value Ref Range Status   Specimen Description URINE, RANDOM  Final   Special Requests NONE  Final   Culture (A)  Final    <10,000 COLONIES/mL INSIGNIFICANT GROWTH Performed at [facility], [place] [address] [place], [place] [address]    Report Status 08/27/2016 FINAL  Final  Aerobic Culture (superficial specimen)     Status: None   Collection Time: 08/26/16  6:15 PM  Result Value Ref Range Status   Specimen Description FOOT LEFT  Final   Special Requests NONE  Final   Gram Stain   Final    FEW WBC PRESENT, PREDOMINANTLY PMN RARE GRAM POSITIVE COCCI IN PAIRS Performed at [facility], [place] [address], [place], [place] [address]    Culture   Final    RARE METHICILLIN RESISTANT STAPHYLOCOCCUS AUREUS MULTIPLE ORGANISMS PRESENT, NONE PREDOMINANT    Report Status 08/30/2016 FINAL  Final   Organism ID, Bacteria METHICILLIN RESISTANT STAPHYLOCOCCUS AUREUS  Final      Susceptibility   Methicillin resistant staphylococcus aureus - MIC*    CIPROFLOXACIN >=8 RESISTANT Resistant     ERYTHROMYCIN >=8 RESISTANT Resistant     GENTAMICIN <=0.5 SENSITIVE Sensitive     OXACILLIN >=4 RESISTANT Resistant     TETRACYCLINE <=1 SENSITIVE Sensitive     VANCOMYCIN 1 SENSITIVE Sensitive     TRIMETH/SULFA <=10 SENSITIVE Sensitive     CLINDAMYCIN <=0.25 SENSITIVE Sensitive     RIFAMPIN <=0.5 SENSITIVE Sensitive     Inducible Clindamycin NEGATIVE Sensitive     * RARE METHICILLIN RESISTANT STAPHYLOCOCCUS AUREUS  Aerobic/Anaerobic Culture (surgical/deep wound)     Status: None   Collection Time: 08/28/16  1:03 PM  Result Value Ref Range Status   Specimen Description TOE  Final   Special Requests SECOND TOE LEFT FOOT BONE CULTURE  Final   Gram Stain   Final    MODERATE WBC PRESENT,BOTH PMN AND MONONUCLEAR NO ORGANISMS SEEN    Culture   Final    RARE STAPHYLOCOCCUS SPECIES (COAGULASE NEGATIVE) CALL MICROBIOLOGY LAB IF SENSITIVITIES ARE REQUIRED. NO ANAEROBES  ISOLATED Performed at [facility], [place] [address] [place], [place] [address]    Report Status 09/04/2016 FINAL  Final  Aerobic Culture (superficial specimen)     Status: None   Collection Time: 08/31/16 12:22 PM  Result Value Ref Range Status   Specimen Description ULCER TOE left great  Final   Special Requests NONE  Final   Gram Stain   Final    FEW WBC  PRESENT, PREDOMINANTLY PMN NO ORGANISMS SEEN    Culture   Final    NO GROWTH 2 DAYS Performed at [place] [address], [place], [place] [address]    Report Status 09/03/2016 FINAL  Final  Aerobic/Anaerobic Culture (surgical/deep wound)     Status: None   Collection Time: 09/04/16 12:54 PM  Result Value Ref Range Status   Specimen Description BONE INFECTED LEFT GREAT TOE  Final   Special Requests NONE  Final   Gram Stain   Final    RARE WBC PRESENT, PREDOMINANTLY MONONUCLEAR NO ORGANISMS SEEN    Culture   Final    RARE ACTINOMYCES ODONTOLYTICUS CRITICAL RESULT CALLED TO, READ BACK BY AND VERIFIED WITH: DR Blima Dessert AT 1440 09/09/16 BY L BENFIELD CONCERNING GROWTH ON CULTURE Standardized susceptibility testing for this organism is not available. NO ANAEROBES ISOLATED Performed at [facility], [place] [address] [place], [place] [address]    Report Status 09/11/2016 FINAL  Final    RADIOLOGY:  No results found.   Management plans discussed with the patient, family and they are in agreement.  CODE STATUS:  Code Status History    Date Active Date Inactive Code Status Order ID Comments User Context   08/25/2016  8:56 PM 09/11/2016  7:30 PM Full Code [id number]  Quintella Baton, MD Inpatient      TOTAL TIME TAKING CARE OF THIS PATIENT: 38  minutes.    Gladstone Lighter M.D on 09/12/2016 at 2:39 PM  Between 7am to 6pm - Pager - [phone]  After 6pm go to [email or web address] - Technical brewer [facility]  Office  [phone]  CC: Primary care physician; No PCP  Per Patient   Note: This dictation was prepared with Dragon dictation along with smaller phrase technology. Any transcriptional errors that result from this process are unintentional.

## 2019-04-02 IMAGING — MR MR FOOT*L* W/O CM
4 of 5 series · 27 of 40 positions shown · non-contrast
Comparison: Plain films left foot 08/25/2016.

CLINICAL DATA: Left foot wounds with an ulceration on the second
toe. Redness, pain and swelling of the left foot. Elevated white
blood cell count.

EXAM:
MRI OF THE LEFT FOOT WITHOUT CONTRAST
TECHNIQUE: Multiplanar, multisequence MR imaging of the left foot Was
performed. No intravenous contrast was administered.

[Series 4: T1 · coronal · 3.0mm · 0.56mm/px · 8 of 47 slices shown (1 of 2)]
[im 1/47]
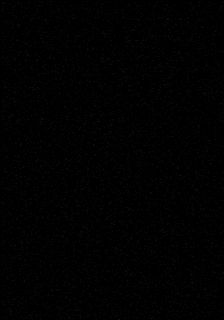
[im 6/47]
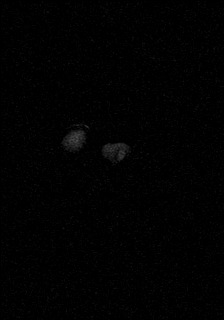
[im 16/47]
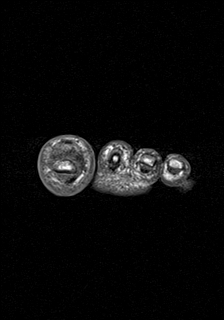
[im 21/47]
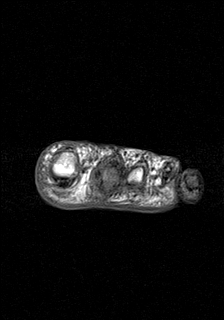
[im 26/47]
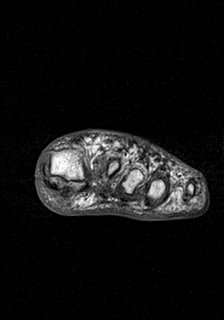
[im 31/47]
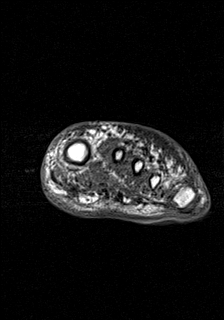
[im 41/47]
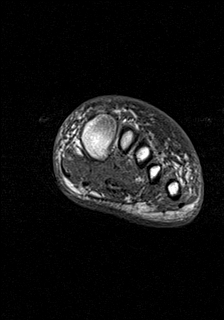
[im 47/47]
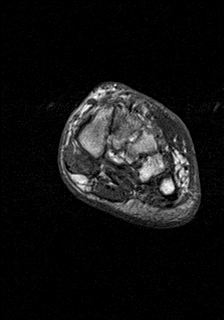

[Series 5: T2 fat-sat · coronal · 3.0mm · 0.35mm/px · 11 of 47 slices shown]
[im 1/47]
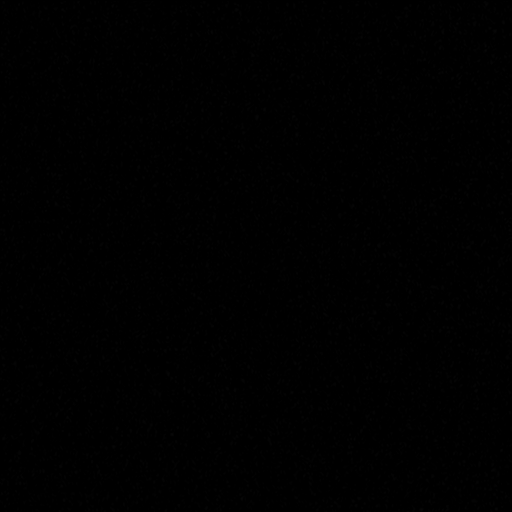
[im 5/47]
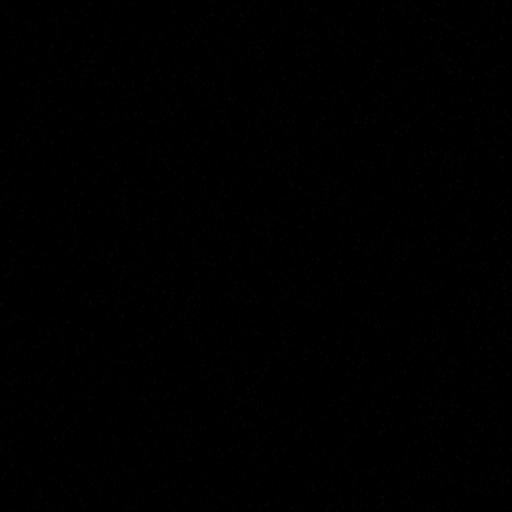
[im 10/47]
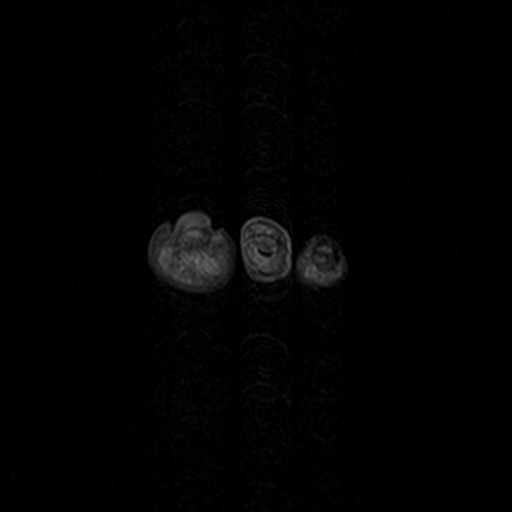
[im 14/47]
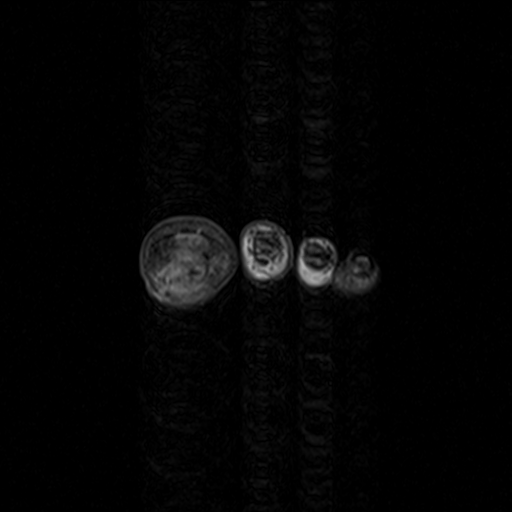
[im 19/47]
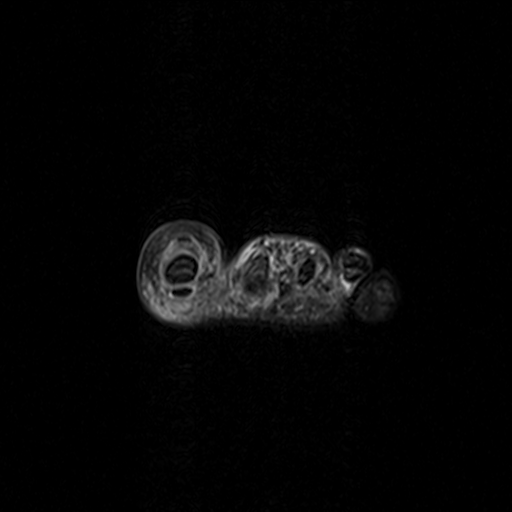
[im 24/47]
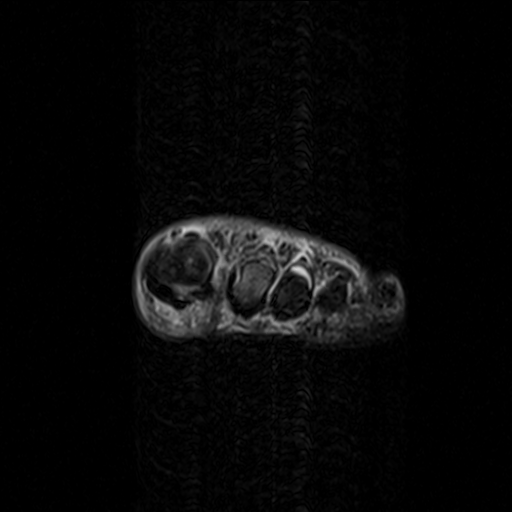
[im 28/47]
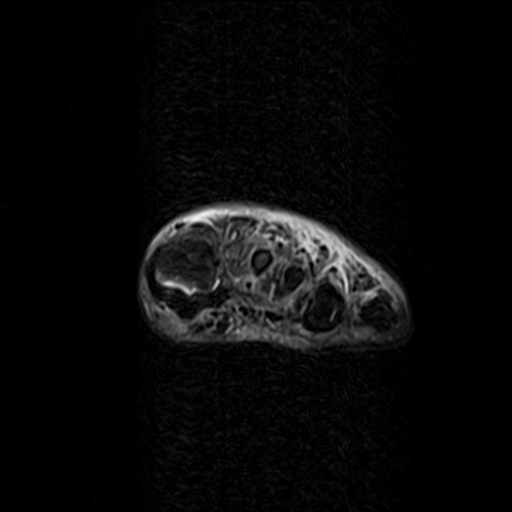
[im 33/47]
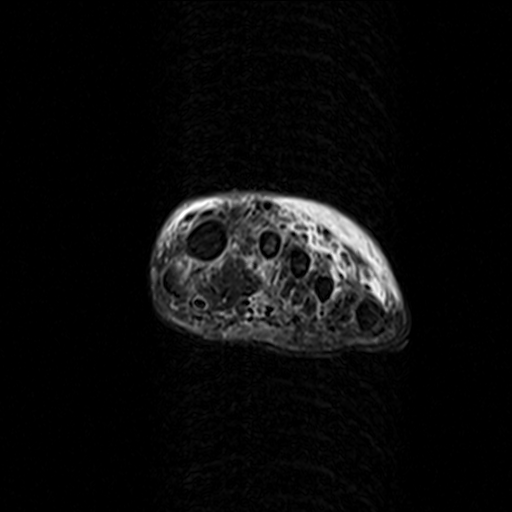
[im 37/47]
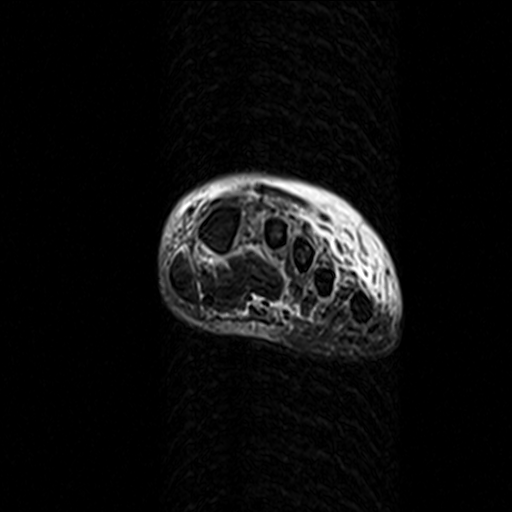
[im 42/47]
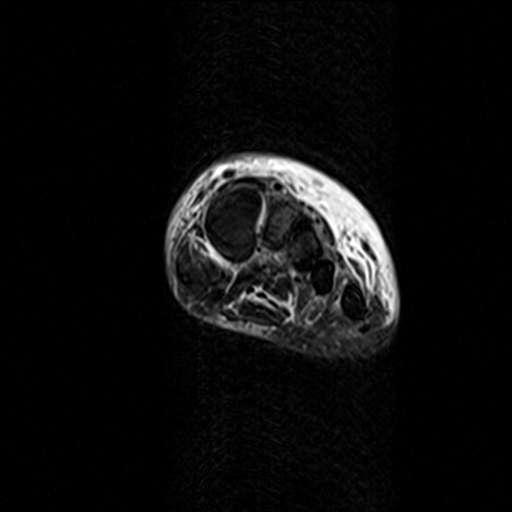
[im 47/47]
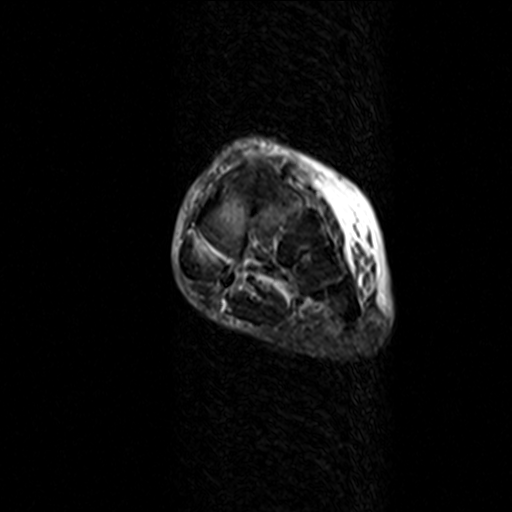

[Series 6: T1 · axial · 3.0mm · 0.56mm/px · z∈[-80,-2]mm · 5 of 25 slices shown (2 of 2)]
[im 1/25]
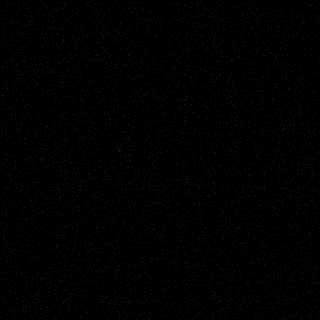
[im 5/25]
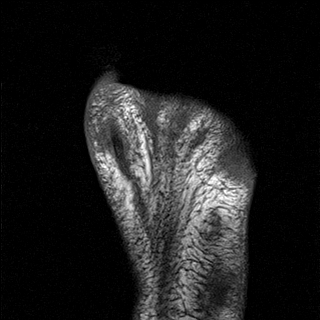
[im 10/25]
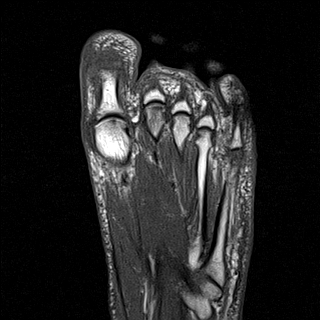
[im 15/25]
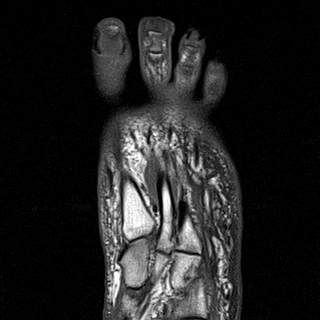
[im 25/25]
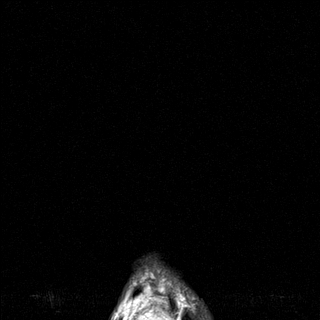

[Series 7: STIR · axial · 3.0mm · 0.35mm/px · z∈[-67,-2]mm · 3 of 25 slices shown]
[im 5/25]
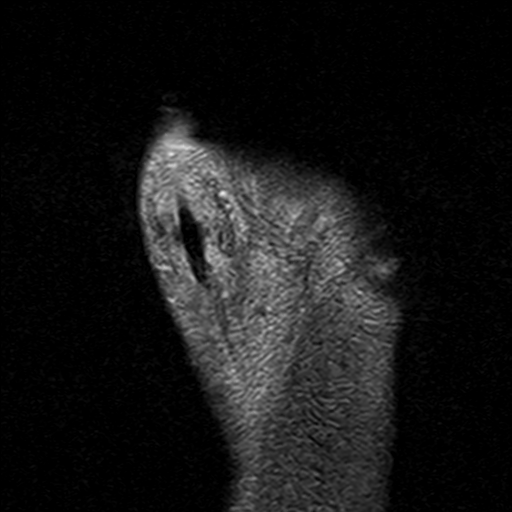
[im 15/25]
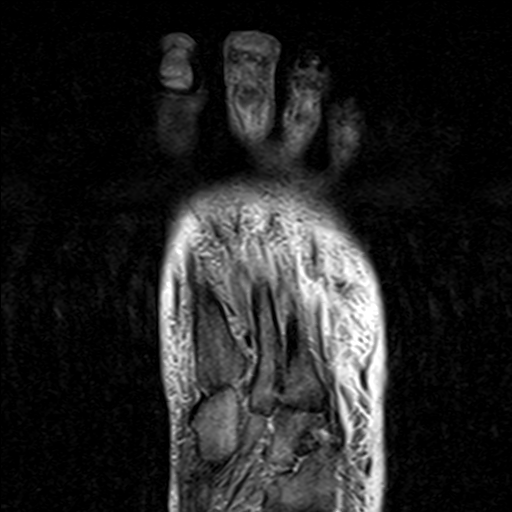
[im 25/25]
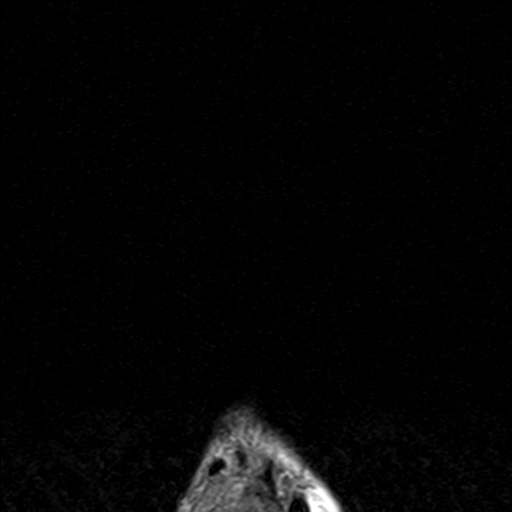

[27 of 40 positions shown; findings below may reference images not displayed]

FINDINGS: Bones/Joint/Cartilage

Fracture of the head of the proximal phalanx of the great toe is
identified as seen on the prior plain films. The fracture is
transverse in orientation through the distal metaphysis with a
longitudinal component through the central aspect the articular
surface. The articular surface is divided into 2 fragments. The
distal fracture fragments are superiorly displaced 1 shaft width.
There is little to no marrow edema about the fracture most
consistent with chronic injury.

Marked marrow edema is present throughout the distal phalanx of the
great toe. There is also marked marrow edema throughout the middle
and distal phalanges of the second toe.

A milder degree of marrow edema is seen in the head of the second
metatarsal with flattening of the articular surface consistent with
Freiberg's infraction. Mildly increased T2 signal in all other
imaged bones is identified without focal abnormality may be due to
stress change or related to technical factors on the exam.

Ligaments

Intact.

Muscles and Tendons

Intact.

Soft tissues

Intense subcutaneous edema is present over the dorsum of the foot.
Fluid is seen between the heads of the first and second metatarsals.
Small second and third MTP joint effusions are noted.
IMPRESSION: Marked marrow edema in the distal phalanx of the great toe and
middle and distal phalanges of the second toe is most consistent
osteomyelitis.

Fracture of the head of the proximal phalanx of the great toe
appears remote.

Findings consistent with Freiberg's infraction of the head of the
second metatarsal with associated marrow edema.

Intense subcutaneous edema over the dorsum of the foot compatible
with cellulitis. No abscess is identified.

Fluid between the heads of the first and second metatarsals
consistent with intermetatarsal bursitis.
